# Patient Record
Sex: Female | Born: 1965
Health system: Southern US, Community
[De-identification: ages and names within clinical notes are randomized; demographics above are authoritative.]

## PROBLEM LIST (undated history)

## (undated) DIAGNOSIS — I10 Essential (primary) hypertension: Secondary | ICD-10-CM

---

## 1998-05-15 ENCOUNTER — Emergency Department (HOSPITAL_COMMUNITY): Admission: EM | Admit: 1998-05-15 | Discharge: 1998-05-15 | Payer: Self-pay

## 2000-12-23 ENCOUNTER — Emergency Department (HOSPITAL_COMMUNITY): Admission: EM | Admit: 2000-12-23 | Discharge: 2000-12-23 | Payer: Self-pay | Admitting: Emergency Medicine

## 2002-01-28 ENCOUNTER — Emergency Department (HOSPITAL_COMMUNITY): Admission: EM | Admit: 2002-01-28 | Discharge: 2002-01-28 | Payer: Self-pay

## 2002-01-30 ENCOUNTER — Emergency Department (HOSPITAL_COMMUNITY): Admission: EM | Admit: 2002-01-30 | Discharge: 2002-01-30 | Payer: Self-pay | Admitting: Emergency Medicine

## 2002-03-05 ENCOUNTER — Emergency Department (HOSPITAL_COMMUNITY): Admission: EM | Admit: 2002-03-05 | Discharge: 2002-03-05 | Payer: Self-pay | Admitting: Emergency Medicine

## 2002-03-05 ENCOUNTER — Encounter: Payer: Self-pay | Admitting: Emergency Medicine

## 2004-11-30 ENCOUNTER — Emergency Department (HOSPITAL_COMMUNITY): Admission: EM | Admit: 2004-11-30 | Discharge: 2004-11-30 | Payer: Self-pay | Admitting: Emergency Medicine

## 2007-08-01 ENCOUNTER — Emergency Department (HOSPITAL_COMMUNITY): Admission: EM | Admit: 2007-08-01 | Discharge: 2007-08-01 | Payer: Self-pay | Admitting: Emergency Medicine

## 2007-08-07 ENCOUNTER — Emergency Department (HOSPITAL_COMMUNITY): Admission: EM | Admit: 2007-08-07 | Discharge: 2007-08-07 | Payer: Self-pay | Admitting: Emergency Medicine

## 2008-05-13 ENCOUNTER — Emergency Department (HOSPITAL_COMMUNITY): Admission: EM | Admit: 2008-05-13 | Discharge: 2008-05-14 | Payer: Self-pay | Admitting: Emergency Medicine

## 2010-01-01 ENCOUNTER — Emergency Department (HOSPITAL_COMMUNITY): Admission: EM | Admit: 2010-01-01 | Discharge: 2010-01-01 | Payer: Self-pay | Admitting: Emergency Medicine

## 2010-03-24 ENCOUNTER — Encounter: Admission: RE | Admit: 2010-03-24 | Discharge: 2010-03-24 | Payer: Self-pay | Admitting: Internal Medicine

## 2012-07-10 ENCOUNTER — Encounter (HOSPITAL_COMMUNITY): Payer: Self-pay

## 2012-07-10 ENCOUNTER — Emergency Department (HOSPITAL_COMMUNITY)
Admission: EM | Admit: 2012-07-10 | Discharge: 2012-07-10 | Disposition: A | Discharge status: Home / Self Care | Payer: Medicaid Other | Attending: Emergency Medicine | Admitting: Emergency Medicine

## 2012-07-10 DIAGNOSIS — I10 Essential (primary) hypertension: Secondary | ICD-10-CM

## 2012-07-10 DIAGNOSIS — Z760 Encounter for issue of repeat prescription: Secondary | ICD-10-CM | POA: Insufficient documentation

## 2012-07-10 DIAGNOSIS — F17200 Nicotine dependence, unspecified, uncomplicated: Secondary | ICD-10-CM | POA: Insufficient documentation

## 2012-07-10 HISTORY — DX: Essential (primary) hypertension: I10

## 2012-07-10 MED ORDER — LISINOPRIL 10 MG PO TABS: 10.0000 mg | ORAL_TABLET | Freq: Every day | ORAL | Status: DC

## 2012-07-10 NOTE — ED Provider Notes (Signed)
History     CSN: [id number]  Arrival date & time 07/10/12  1153   First MD Initiated Contact with Patient 07/10/12 1729      Chief Complaint  Patient presents with  . Hypertension    (Consider location/radiation/quality/duration/timing/severity/associated sxs/prior treatment) HPI  46 year old female requesting for medication refill.  Pt sts she has been out of her lisinopril 10mg  PO once daily for the past 2 months.  She was walking around [place] today and she developed a frontal headache and also lightheadedness.  She was standing in line when someone said "your complexion looks pale" which caused her to be concern and come to ER to request medication refill.  Pt currently only complaining of a mild throbbing headache but denies vision changes, cp, sob, abd pain, n/v/d, urinary complaints, or leg swelling.  She has no other complaint.  Pt follow up at [facility] clinic.    Past Medical History  Diagnosis Date  . Hypertension     History reviewed. No pertinent past surgical history.  Family History  Problem Relation Age of Onset  . Cancer Mother     History  Substance Use Topics  . Smoking status: Current Some Day Smoker  . Smokeless tobacco: Never Used  . Alcohol Use: Yes     occasional    OB History    Grav Para Term Preterm Abortions TAB SAB Ect Mult Living                  Review of Systems  All other systems reviewed and are negative.      Allergies  Review of patient's allergies indicates no known allergies.  Home Medications  No current outpatient prescriptions on file.  BP 156/108  Pulse 69  Temp 98.1 F (36.7 C) (Oral)  Resp 16  SpO2 100%  Physical Exam  Nursing note and vitals reviewed. Constitutional: She is oriented to person, place, and time. She appears well-developed and well-nourished. No distress.       Awake, alert, nontoxic appearance  HENT:  Head: Atraumatic.  Eyes: Conjunctivae normal are normal. Right eye exhibits no  discharge. Left eye exhibits no discharge.  Neck: Neck supple.  Cardiovascular: Normal rate and regular rhythm.   Pulmonary/Chest: Effort normal. No respiratory distress. She exhibits no tenderness.  Abdominal: Soft. There is no tenderness. There is no rebound.  Musculoskeletal: She exhibits no edema and no tenderness.       ROM appears intact, no obvious focal weakness  Neurological: She is alert and oriented to person, place, and time. She has normal strength. Coordination and gait normal. GCS eye subscore is 4. GCS verbal subscore is 5. GCS motor subscore is 6.       Mental status and motor strength appears intact  Skin: No rash noted.  Psychiatric: She has a normal mood and affect.    ED Course  Procedures (including critical care time)  Labs Reviewed - No data to display No results found.   No diagnosis found.  1. Hypertension 2. Medication refill  MDM  Pt is here for medication refills.  She does complain of mild headache however she is alert and oriented, no gross neuro deficit, no cp, sob, or other signs suggestive of end organ damage.  Her BP is 151/91 and vital sign otherwise stable.  Pt request to be discharge in order to catch the bus.  I felt pt is stable to be d/c.  I recommend f/u with PCP for further evaluation.  Will refill her lisinopril.    BP 156/108  Pulse 69  Temp 98.1 F (36.7 C) (Oral)  Resp 16  SpO2 100%       Fayrene Helper, PA-C 07/10/12 1742  Fayrene Helper, PA-C 07/10/12 1742

## 2012-07-10 NOTE — ED Notes (Addendum)
Patient reports that she was at [facility] today and got diaphoretic, dizzy, and a frontal headache. Patient states she is suppose to be on Lisinopril, but has ran out.

## 2012-07-11 NOTE — ED Provider Notes (Signed)
Medical screening examination/treatment/procedure(s) were performed by non-physician practitioner and as supervising physician I was immediately available for consultation/collaboration.  Doug Sou, MD 07/11/12 [phone]

## 2013-06-11 ENCOUNTER — Encounter (HOSPITAL_COMMUNITY): Payer: Self-pay | Admitting: Emergency Medicine

## 2013-06-11 ENCOUNTER — Emergency Department (HOSPITAL_COMMUNITY)
Admission: EM | Admit: 2013-06-11 | Discharge: 2013-06-11 | Disposition: A | Discharge status: Home / Self Care | Payer: Medicaid Other | Attending: Emergency Medicine | Admitting: Emergency Medicine

## 2013-06-11 DIAGNOSIS — F17200 Nicotine dependence, unspecified, uncomplicated: Secondary | ICD-10-CM | POA: Insufficient documentation

## 2013-06-11 DIAGNOSIS — J029 Acute pharyngitis, unspecified: Secondary | ICD-10-CM | POA: Insufficient documentation

## 2013-06-11 DIAGNOSIS — Z79899 Other long term (current) drug therapy: Secondary | ICD-10-CM | POA: Insufficient documentation

## 2013-06-11 DIAGNOSIS — I10 Essential (primary) hypertension: Secondary | ICD-10-CM | POA: Insufficient documentation

## 2013-06-11 DIAGNOSIS — J019 Acute sinusitis, unspecified: Secondary | ICD-10-CM | POA: Insufficient documentation

## 2013-06-11 MED ORDER — LORATADINE-PSEUDOEPHEDRINE ER 10-240 MG PO TB24: 1.0000 | ORAL_TABLET | Freq: Every day | ORAL | Status: DC

## 2013-06-11 MED ORDER — FLUTICASONE PROPIONATE 50 MCG/ACT NA SUSP: 2.0000 | Freq: Every day | NASAL | Status: DC

## 2013-06-11 MED ORDER — AMOXICILLIN 500 MG PO CAPS: 1000.0000 mg | ORAL_CAPSULE | Freq: Three times a day (TID) | ORAL | Status: DC

## 2013-06-11 NOTE — ED Provider Notes (Signed)
CSN: [id number]     Arrival date & time 06/11/13  1217 History   First MD Initiated Contact with Patient 06/11/13 1234     Chief Complaint  Patient presents with  . Headache  . Sore Throat  . Nasal Congestion   (Consider location/radiation/quality/duration/timing/severity/associated sxs/prior Treatment) HPI Comments: Patient presents with a chief complaint of sinus pressure, nasal congestion, cough, and sore throat.  She reports that her symptoms have been present for the past week and are gradually worsening.  She has taken Alka-Seltzer cold medicine for her symptoms without relief.  She denies fever, chills, nausea, vomiting, diarrhea, SOB, or chest pain.    The history is provided by the patient.    Past Medical History  Diagnosis Date  . Hypertension    History reviewed. No pertinent past surgical history. Family History  Problem Relation Age of Onset  . Cancer Mother    History  Substance Use Topics  . Smoking status: Current Some Day Smoker  . Smokeless tobacco: Never Used  . Alcohol Use: Yes     Comment: occasional   OB History   Grav Para Term Preterm Abortions TAB SAB Ect Mult Living                 Review of Systems  HENT: Positive for congestion, sore throat, rhinorrhea and sinus pressure.   All other systems reviewed and are negative.    Allergies  Review of patient's allergies indicates no known allergies.  Home Medications   Current Outpatient Rx  Name  Route  Sig  Dispense  Refill  . CALCIUM-VITAMIN D PO   Oral   Take 1 tablet by mouth daily.         . Chlorphen-Phenyleph-ASA (ALKA-SELTZER PLUS COLD) 2-7.8-325 MG TBEF   Oral   Take 2 capsules by mouth daily as needed (for cold).         Marland Kitchen lisinopril (PRINIVIL) 10 MG tablet   Oral   Take 1 tablet (10 mg total) by mouth daily.   30 tablet   0   . Multiple Vitamins-Minerals (ADULT ONE DAILY GUMMIES) CHEW   Oral   Chew 1 each by mouth daily.         . Omega-3 Fatty Acids (FISH OIL  PO)   Oral   Take 1 capsule by mouth daily.          BP 129/97  Pulse 86  Temp(Src) 98.6 F (37 C) (Oral)  Resp 14  SpO2 96% Physical Exam  Nursing note and vitals reviewed. Constitutional: She appears well-developed and well-nourished.  HENT:  Head: Normocephalic and atraumatic.  Nose: Mucosal edema and rhinorrhea present.  Mouth/Throat: Uvula is midline. No trismus in the jaw. No edematous. Oropharyngeal exudate and posterior oropharyngeal erythema present.  Neck: Normal range of motion. Neck supple.  Cardiovascular: Normal rate, regular rhythm and normal heart sounds.   Pulmonary/Chest: Effort normal and breath sounds normal.  Lymphadenopathy:    She has cervical adenopathy.  Neurological: She is alert.  Skin: Skin is warm and dry. No rash noted.  Psychiatric: She has a normal mood and affect.    ED Course  Procedures (including critical care time) Labs Review Labs Reviewed - No data to display Imaging Review No results found.  MDM  No diagnosis found. Patient presenting with signs and symptoms consistent with an Acute Sinusitis.  Patient discharged home with antibiotic.      Pascal Lux [place], PA-C 06/12/13 2056

## 2013-06-11 NOTE — ED Notes (Signed)
Pt c/o headache, sore throat, and nasal congestion x1 week.  Pt sts "I believe it's a sinus infection"  Pt NAD at this time.

## 2013-06-13 NOTE — ED Provider Notes (Signed)
Medical screening examination/treatment/procedure(s) were performed by non-physician practitioner and as supervising physician I was immediately available for consultation/collaboration.   Audree Camel, MD 06/13/13 [phone]

## 2014-01-02 ENCOUNTER — Emergency Department (HOSPITAL_COMMUNITY)
Admission: EM | Admit: 2014-01-02 | Discharge: 2014-01-02 | Disposition: A | Discharge status: Home / Self Care | Payer: Medicaid Other | Source: Home / Self Care | Attending: Family Medicine | Admitting: Family Medicine

## 2014-01-02 ENCOUNTER — Encounter (HOSPITAL_COMMUNITY): Payer: Self-pay | Admitting: Emergency Medicine

## 2014-01-02 DIAGNOSIS — I10 Essential (primary) hypertension: Secondary | ICD-10-CM

## 2014-01-02 LAB — POCT I-STAT, CHEM 8
BUN: 13 mg/dL (ref 6–23)
Calcium, Ion: 1.24 mmol/L — ABNORMAL HIGH (ref 1.12–1.23)
Chloride: 104 mEq/L (ref 96–112)
Creatinine, Ser: 1 mg/dL (ref 0.50–1.10)
GLUCOSE: 123 mg/dL — AB (ref 70–99)
HEMATOCRIT: 46 % (ref 36.0–46.0)
HEMOGLOBIN: 15.6 g/dL — AB (ref 12.0–15.0)
POTASSIUM: 4.5 meq/L (ref 3.7–5.3)
SODIUM: 140 meq/L (ref 137–147)
TCO2: 28 mmol/L (ref 0–100)

## 2014-01-02 MED ORDER — LISINOPRIL-HYDROCHLOROTHIAZIDE 10-12.5 MG PO TABS: 1.0000 | ORAL_TABLET | Freq: Every day | ORAL | Status: DC

## 2014-01-02 NOTE — ED Notes (Signed)
Has not had her BP medication for 1 year, and thinks this may be causing her HA . Also concerned for pain in lower abdominal area for more than 1 year ( no sex for 1 year or more) Has not seen a MD for either problem in over 1 year

## 2014-01-02 NOTE — Discharge Instructions (Signed)
Take medicine daily and see your doctor for follow-up. DASH Diet The DASH diet stands for "Dietary Approaches to Stop Hypertension." It is a healthy eating plan that has been shown to reduce high blood pressure (hypertension) in as little as 14 days, while also possibly providing other significant health benefits. These other health benefits include reducing the risk of breast cancer after menopause and reducing the risk of type 2 diabetes, heart disease, colon cancer, and stroke. Health benefits also include weight loss and slowing kidney failure in patients with chronic kidney disease.  DIET GUIDELINES  Limit salt (sodium). Your diet should contain less than 1500 mg of sodium daily.  Limit refined or processed carbohydrates. Your diet should include mostly whole grains. Desserts and added sugars should be used sparingly.  Include small amounts of heart-healthy fats. These types of fats include nuts, oils, and tub margarine. Limit saturated and trans fats. These fats have been shown to be harmful in the body. CHOOSING FOODS  The following food groups are based on a 2000 calorie diet. See your Registered Dietitian for individual calorie needs. Grains and Grain Products (6 to 8 servings daily)  Eat More Often: Whole-wheat bread, brown rice, whole-grain or wheat pasta, quinoa, popcorn without added fat or salt (air popped).  Eat Less Often: White bread, white pasta, white rice, cornbread. Vegetables (4 to 5 servings daily)  Eat More Often: Fresh, frozen, and canned vegetables. Vegetables may be raw, steamed, roasted, or grilled with a minimal amount of fat.  Eat Less Often/Avoid: Creamed or fried vegetables. Vegetables in a cheese sauce. Fruit (4 to 5 servings daily)  Eat More Often: All fresh, canned (in natural juice), or frozen fruits. Dried fruits without added sugar. One hundred percent fruit juice ( cup [237 mL] daily).  Eat Less Often: Dried fruits with added sugar. Canned fruit in  light or heavy syrup. Foot LockerLean Meats, Fish, and Poultry (2 servings or less daily. One serving is 3 to 4 oz [85-114 g]).  Eat More Often: Ninety percent or leaner ground beef, tenderloin, sirloin. Round cuts of beef, chicken breast, [place]key breast. All fish. Grill, bake, or broil your meat. Nothing should be fried.  Eat Less Often/Avoid: Fatty cuts of meat, [place]key, or chicken leg, thigh, or wing. Fried cuts of meat or fish. Dairy (2 to 3 servings)  Eat More Often: Low-fat or fat-free milk, low-fat plain or light yogurt, reduced-fat or part-skim cheese.  Eat Less Often/Avoid: Milk (whole, 2%).Whole milk yogurt. Full-fat cheeses. Nuts, Seeds, and Legumes (4 to 5 servings per week)  Eat More Often: All without added salt.  Eat Less Often/Avoid: Salted nuts and seeds, canned beans with added salt. Fats and Sweets (limited)  Eat More Often: Vegetable oils, tub margarines without trans fats, sugar-free gelatin. Mayonnaise and salad dressings.  Eat Less Often/Avoid: Coconut oils, palm oils, butter, stick margarine, cream, half and half, cookies, candy, pie. FOR MORE INFORMATION The Dash Diet Eating Plan: www.dashdiet.org Document Released: 09/09/2011 Document Revised: 12/13/2011 Document Reviewed: 09/09/2011 [facility] Patient Information 2014 [place], [place].

## 2014-01-02 NOTE — ED Provider Notes (Signed)
CSN: [id number]     Arrival date & time 01/02/14  1626 History   First MD Initiated Contact with Patient 01/02/14 1711     Chief Complaint  Patient presents with  . Hypertension   (Consider location/radiation/quality/duration/timing/severity/associated sxs/prior Treatment) Patient is a 48 y.o. female presenting with hypertension. The history is provided by the patient.  Hypertension This is a chronic problem. Episode onset: out of meds for >80mo. no cp , sob. Associated symptoms include headaches. Pertinent negatives include no chest pain and no shortness of breath.    Past Medical History  Diagnosis Date  . Hypertension    History reviewed. No pertinent past surgical history. Family History  Problem Relation Age of Onset  . Cancer Mother    History  Substance Use Topics  . Smoking status: Current Some Day Smoker  . Smokeless tobacco: Never Used  . Alcohol Use: Yes     Comment: occasional   OB History   Grav Para Term Preterm Abortions TAB SAB Ect Mult Living                 Review of Systems  Constitutional: Negative.   Respiratory: Negative for shortness of breath.   Cardiovascular: Negative for chest pain, palpitations and leg swelling.  Gastrointestinal: Negative.   Neurological: Positive for headaches.    Allergies  Review of patient's allergies indicates no known allergies.  Home Medications   Current Outpatient Rx  Name  Route  Sig  Dispense  Refill  . amoxicillin (AMOXIL) 500 MG capsule   Oral   Take 2 capsules (1,000 mg total) by mouth 3 (three) times daily.   60 capsule   0   . CALCIUM-VITAMIN D PO   Oral   Take 1 tablet by mouth daily.         . Chlorphen-Phenyleph-ASA (ALKA-SELTZER PLUS COLD) 2-7.8-325 MG TBEF   Oral   Take 2 capsules by mouth daily as needed (for cold).         . fluticasone (FLONASE) 50 MCG/ACT nasal spray   Nasal   Place 2 sprays into the nose daily.   16 g   0   . lisinopril (PRINIVIL) 10 MG tablet   Oral   Take  1 tablet (10 mg total) by mouth daily.   30 tablet   0   . lisinopril-hydrochlorothiazide (PRINZIDE,ZESTORETIC) 10-12.5 MG per tablet   Oral   Take 1 tablet by mouth daily.   30 tablet   1   . loratadine-pseudoephedrine (CLARITIN-D 24 HOUR) 10-240 MG per 24 hr tablet   Oral   Take 1 tablet by mouth daily.   10 tablet   0   . Multiple Vitamins-Minerals (ADULT ONE DAILY GUMMIES) CHEW   Oral   Chew 1 each by mouth daily.         . Omega-3 Fatty Acids (FISH OIL PO)   Oral   Take 1 capsule by mouth daily.          BP 144/106  Pulse 84  Temp(Src) 98.2 F (36.8 C) (Oral)  Resp 20  SpO2 98% Physical Exam  Nursing note and vitals reviewed. Constitutional: She is oriented to person, place, and time. She appears well-developed and well-nourished. No distress.  HENT:  Head: Normocephalic.  Eyes: Conjunctivae and EOM are normal. Pupils are equal, round, and reactive to light.  Neck: Normal range of motion. Neck supple.  Cardiovascular: Normal rate, regular rhythm, normal heart sounds and intact distal pulses.   Pulmonary/Chest: Effort  normal and breath sounds normal.  Lymphadenopathy:    She has no cervical adenopathy.  Neurological: She is alert and oriented to person, place, and time.  Skin: Skin is warm and dry.    ED Course  Procedures (including critical care time) Labs Review Labs Reviewed  POCT I-STAT, CHEM 8 - Abnormal; Notable for the following:    Glucose, Bld 123 (*)    Calcium, Ion 1.24 (*)    Hemoglobin 15.6 (*)    All other components within normal limits   Imaging Review No results found. i-stat glu 123, ca 1.24  MDM   1. Hypertension, benign       Linna HoffJames D Athina Fahey, MD 01/02/14 1759

## 2014-10-12 ENCOUNTER — Emergency Department (HOSPITAL_COMMUNITY)
Admission: EM | Admit: 2014-10-12 | Discharge: 2014-10-12 | Disposition: A | Discharge status: Home / Self Care | Payer: Medicaid Other | Attending: Emergency Medicine | Admitting: Emergency Medicine

## 2014-10-12 ENCOUNTER — Encounter (HOSPITAL_COMMUNITY): Payer: Self-pay | Admitting: Emergency Medicine

## 2014-10-12 DIAGNOSIS — R079 Chest pain, unspecified: Secondary | ICD-10-CM | POA: Insufficient documentation

## 2014-10-12 DIAGNOSIS — Z79899 Other long term (current) drug therapy: Secondary | ICD-10-CM | POA: Insufficient documentation

## 2014-10-12 DIAGNOSIS — I10 Essential (primary) hypertension: Secondary | ICD-10-CM | POA: Insufficient documentation

## 2014-10-12 DIAGNOSIS — R51 Headache: Secondary | ICD-10-CM | POA: Insufficient documentation

## 2014-10-12 DIAGNOSIS — R519 Headache, unspecified: Secondary | ICD-10-CM

## 2014-10-12 DIAGNOSIS — Z7951 Long term (current) use of inhaled steroids: Secondary | ICD-10-CM | POA: Insufficient documentation

## 2014-10-12 DIAGNOSIS — Z720 Tobacco use: Secondary | ICD-10-CM | POA: Insufficient documentation

## 2014-10-12 LAB — CBC
HCT: 41.8 % (ref 36.0–46.0)
HEMOGLOBIN: 14.1 g/dL (ref 12.0–15.0)
MCH: 29.5 pg (ref 26.0–34.0)
MCHC: 33.7 g/dL (ref 30.0–36.0)
MCV: 87.4 fL (ref 78.0–100.0)
Platelets: 280 10*3/uL (ref 150–400)
RBC: 4.78 MIL/uL (ref 3.87–5.11)
RDW: 13.7 % (ref 11.5–15.5)
WBC: 7 10*3/uL (ref 4.0–10.5)

## 2014-10-12 LAB — BASIC METABOLIC PANEL
ANION GAP: 5 (ref 5–15)
BUN: 21 mg/dL (ref 6–23)
CHLORIDE: 107 meq/L (ref 96–112)
CO2: 27 mmol/L (ref 19–32)
CREATININE: 0.86 mg/dL (ref 0.50–1.10)
Calcium: 9 mg/dL (ref 8.4–10.5)
GFR calc Af Amer: 90 mL/min — ABNORMAL LOW (ref >90)
GFR calc non Af Amer: 77 mL/min — ABNORMAL LOW (ref >90)
Glucose, Bld: 103 mg/dL — ABNORMAL HIGH (ref 70–99)
POTASSIUM: 3.6 mmol/L (ref 3.5–5.1)
SODIUM: 139 mmol/L (ref 135–145)

## 2014-10-12 LAB — I-STAT TROPONIN, ED: Troponin i, poc: 0 ng/mL (ref 0.00–0.08)

## 2014-10-12 MED ORDER — LISINOPRIL 10 MG PO TABS: 10.0000 mg | ORAL_TABLET | Freq: Every day | ORAL | Status: DC

## 2014-10-12 MED ORDER — HYDROCODONE-ACETAMINOPHEN 5-325 MG PO TABS: 1.0000 | ORAL_TABLET | Freq: Four times a day (QID) | ORAL | Status: DC | PRN

## 2014-10-12 MED ORDER — HYDROCODONE-ACETAMINOPHEN 5-325 MG PO TABS
1.0000 | ORAL_TABLET | Freq: Once | ORAL | Status: AC
Start: 2014-10-12 — End: 2014-10-12
  Administered 2014-10-12: 1 via ORAL
  Filled 2014-10-12: qty 1

## 2014-10-12 NOTE — Discharge Instructions (Signed)
As discussed, your evaluation today has been largely reassuring.  But, it is important that you monitor your condition carefully, and do not hesitate to return to the ED if you develop new, or concerning changes in your condition. ? ?Otherwise, please follow-up with your physician for appropriate ongoing care. ? ?

## 2014-10-12 NOTE — ED Notes (Signed)
Pt arrived to the ED with a complaint of a headache with chest pain.  Pt states the chest pain is centrally located and has had it for a week. Pt describes pain as sharp.  Pt states the headache has been present for three day.  Pt states head pain is located in the forehead and aches

## 2014-10-12 NOTE — ED Notes (Signed)
Awake. Verbally responsive. A/O x4. Resp even and unlabored. No audible adventitious breath sounds noted. ABC's intact. SR on monitor at 80bpm. Pt reported having CP, headache, blurred vision and dizziness x 1 week. Denies radiation, SOB, N/V, and diaphoresis. Pt noted eating meat skins out of vendor machine. Pt encourage not to eat anything until seen via MD. Pt stated that she took Advil and Motrin without relief noted.

## 2014-10-12 NOTE — ED Provider Notes (Signed)
CSN: [id number]     Arrival date & time 10/12/14  1909 History   First MD Initiated Contact with Patient 10/12/14 2109     Chief Complaint  Patient presents with  . Headache  . Chest Pain     (Consider location/radiation/quality/duration/timing/severity/associated sxs/prior Treatment) HPI Patient presents with a headache, concern for hypertension. She notes that the headache began about one week ago, without clear precipitant.  Since that time patient has a headache, generalized discomfort, no confusion, disorders, syncope, visual loss. No relief with single doses of ibuprofen or Tylenol. No unilateral weakness, no chest pain, no dyspnea. This is different than early report to one of the nurses.  Past Medical History  Diagnosis Date  . Hypertension    History reviewed. No pertinent past surgical history. Family History  Problem Relation Age of Onset  . Cancer Mother    History  Substance Use Topics  . Smoking status: Current Some Day Smoker  . Smokeless tobacco: Never Used  . Alcohol Use: Yes     Comment: occasional   OB History    No data available     Review of Systems  Constitutional:       Per HPI, otherwise negative  HENT:       Per HPI, otherwise negative  Respiratory:       Per HPI, otherwise negative  Cardiovascular:       Per HPI, otherwise negative  Gastrointestinal: Negative for vomiting.  Endocrine:       Negative aside from HPI  Genitourinary:       Neg aside from HPI   Musculoskeletal:       Per HPI, otherwise negative  Skin: Negative.   Neurological: Positive for headaches. Negative for syncope.      Allergies  Review of patient's allergies indicates no known allergies.  Home Medications   Prior to Admission medications   Medication Sig Start Date End Date Taking? Authorizing Provider  Chlorphen-Phenyleph-ASA (ALKA-SELTZER PLUS COLD) 2-7.8-325 MG TBEF Take 2 capsules by mouth daily as needed (for cold).   Yes Historical Provider, MD   amoxicillin (AMOXIL) 500 MG capsule Take 2 capsules (1,000 mg total) by mouth 3 (three) times daily. Patient not taking: Reported on 10/12/2014 06/11/13   Santiago Glad, PA-C  CALCIUM-VITAMIN D PO Take 1 tablet by mouth daily.    Historical Provider, MD  fluticasone (FLONASE) 50 MCG/ACT nasal spray Place 2 sprays into the nose daily. Patient taking differently: Place 2 sprays into the nose 2 (two) times daily as needed for allergies (allergies).  06/11/13   Santiago Glad, PA-C  HYDROcodone-acetaminophen (NORCO/VICODIN) 5-325 MG per tablet Take 1 tablet by mouth every 6 (six) hours as needed for moderate pain. 10/12/14   Gerhard Munch, MD  lisinopril (PRINIVIL) 10 MG tablet Take 1 tablet (10 mg total) by mouth daily. 10/12/14   Gerhard Munch, MD  lisinopril-hydrochlorothiazide (PRINZIDE,ZESTORETIC) 10-12.5 MG per tablet Take 1 tablet by mouth daily. Patient not taking: Reported on 10/12/2014 01/02/14   Linna Hoff, MD  loratadine-pseudoephedrine (CLARITIN-D 24 HOUR) 10-240 MG per 24 hr tablet Take 1 tablet by mouth daily. Patient not taking: Reported on 10/12/2014 06/11/13   Heather Laisure, PA-C   BP 115/76 mmHg  Pulse 77  Temp(Src) 98.2 F (36.8 C) (Oral)  Resp 18  SpO2 100% Physical Exam  Constitutional: She is oriented to person, place, and time. She appears well-developed and well-nourished. No distress.  HENT:  Head: Normocephalic and atraumatic.  Eyes: Conjunctivae and EOM are  normal.  Cardiovascular: Normal rate and regular rhythm.   Pulmonary/Chest: Effort normal and breath sounds normal. No stridor. No respiratory distress.  Abdominal: She exhibits no distension.  Musculoskeletal: She exhibits no edema.  Neurological: She is alert and oriented to person, place, and time. No cranial nerve deficit. She exhibits normal muscle tone. Coordination normal.  Skin: Skin is warm and dry.  Psychiatric: She has a normal mood and affect.  Nursing note and vitals reviewed.   ED Course   Procedures (including critical care time) Labs Review Labs Reviewed  BASIC METABOLIC PANEL - Abnormal; Notable for the following:    Glucose, Bld 103 (*)    GFR calc non Af Amer 77 (*)    GFR calc Af Amer 90 (*)    All other components within normal limits  CBC  I-STAT TROPOININ, ED   10:21 PM Patient awake and alert, smiling, headache has resolved.  I counseled the patient on the need to stop smoking.   MDM   Final diagnoses:  Bad headache   Patient presents with concern of headache.  Here she is neurologically intact, hemodynamically stable, awake and alert, smiling during her emergency department course. Labs are reassuring, vital signs reassuring, and with resolution of her symptoms, there is low suspicion for acute new pathology. Patient was provided refill of her antihypertensive, per request, discharged in stable condition.     Gerhard Munchobert Annel Zunker, MD 10/12/14 2223

## 2014-10-12 NOTE — ED Notes (Signed)
Pt has a ride home.  

## 2014-10-12 NOTE — ED Notes (Signed)
EKG given to EDP Kohut for review 

## 2015-09-13 ENCOUNTER — Encounter (HOSPITAL_COMMUNITY): Payer: Self-pay

## 2015-09-13 ENCOUNTER — Emergency Department (HOSPITAL_COMMUNITY)
Admission: EM | Admit: 2015-09-13 | Discharge: 2015-09-13 | Disposition: A | Discharge status: Home / Self Care | Payer: Medicaid Other | Attending: Emergency Medicine | Admitting: Emergency Medicine

## 2015-09-13 DIAGNOSIS — K002 Abnormalities of size and form of teeth: Secondary | ICD-10-CM | POA: Insufficient documentation

## 2015-09-13 DIAGNOSIS — Z7951 Long term (current) use of inhaled steroids: Secondary | ICD-10-CM | POA: Insufficient documentation

## 2015-09-13 DIAGNOSIS — K029 Dental caries, unspecified: Secondary | ICD-10-CM | POA: Insufficient documentation

## 2015-09-13 DIAGNOSIS — F17200 Nicotine dependence, unspecified, uncomplicated: Secondary | ICD-10-CM | POA: Insufficient documentation

## 2015-09-13 DIAGNOSIS — I10 Essential (primary) hypertension: Secondary | ICD-10-CM | POA: Insufficient documentation

## 2015-09-13 DIAGNOSIS — Z79899 Other long term (current) drug therapy: Secondary | ICD-10-CM | POA: Insufficient documentation

## 2015-09-13 DIAGNOSIS — K0889 Other specified disorders of teeth and supporting structures: Secondary | ICD-10-CM | POA: Insufficient documentation

## 2015-09-13 MED ORDER — ACETAMINOPHEN 500 MG PO TABS
1000.0000 mg | ORAL_TABLET | Freq: Once | ORAL | Status: AC
  Administered 2015-09-13: 1000 mg via ORAL
  Filled 2015-09-13: qty 2

## 2015-09-13 MED ORDER — PENICILLIN V POTASSIUM 500 MG PO TABS
500.0000 mg | ORAL_TABLET | Freq: Once | ORAL | Status: AC
  Administered 2015-09-13: 500 mg via ORAL
  Filled 2015-09-13: qty 1

## 2015-09-13 MED ORDER — LISINOPRIL-HYDROCHLOROTHIAZIDE 10-12.5 MG PO TABS: 1.0000 | ORAL_TABLET | Freq: Every day | ORAL | Status: DC

## 2015-09-13 MED ORDER — TRAMADOL HCL 50 MG PO TABS: 50.0000 mg | ORAL_TABLET | Freq: Four times a day (QID) | ORAL | Status: DC | PRN

## 2015-09-13 MED ORDER — PENICILLIN V POTASSIUM 500 MG PO TABS: 500.0000 mg | ORAL_TABLET | Freq: Four times a day (QID) | ORAL | Status: AC

## 2015-09-13 NOTE — Discharge Instructions (Signed)
Dental Pain  ° °Dental pain may be caused by many things, including:  °Tooth decay (cavities or caries). Cavities expose the nerve of your tooth to air and hot or cold temperatures. This can cause pain or discomfort.  °Abscess or infection. A dental abscess is a collection of infected pus from a bacterial infection in the inner part of the tooth (pulp). It usually occurs at the end of the tooth's root.  °Injury.  °An unknown reason (idiopathic). °Your pain may be mild or severe. It may only occur when:  °You are chewing.  °You are exposed to hot or cold temperature.  °You are eating or drinking sugary foods or beverages, such as soda or candy. °Your pain may also be constant.  °HOME CARE INSTRUCTIONS  °Watch your dental pain for any changes. The following actions may help to lessen any discomfort that you are feeling:  °Take medicines only as directed by your dentist.  °If you were prescribed an antibiotic medicine, finish all of it even if you start to feel better.  °Keep all follow-up visits as directed by your dentist. This is important.  °Do not apply heat to the outside of your face.  °Rinse your mouth or gargle with salt water if directed by your dentist. This helps with pain and swelling.  °You can make salt water by adding ¼ tsp of salt to 1 cup of warm water. °Apply ice to the painful area of your face:  °Put ice in a plastic bag.  °Place a towel between your skin and the bag.  °Leave the ice on for 20 minutes, 2-3 times per day. °Avoid foods or drinks that cause you pain, such as:  °Very hot or very cold foods or drinks.  °Sweet or sugary foods or drinks. °SEEK MEDICAL CARE IF:  °Your pain is not controlled with medicines.  °Your symptoms are worse.  °You have new symptoms. °SEEK IMMEDIATE MEDICAL CARE IF:  °You are unable to open your mouth.  °You are having trouble breathing or swallowing.  °You have a fever.  °Your face, neck, or jaw is swollen. °This information is not intended to replace advice given  to you by your health care provider. Make sure you discuss any questions you have with your health care provider.  °Document Released: 09/20/2005 Document Revised: 02/04/2015 Document Reviewed: 09/16/2014  °Elsevier Interactive Patient Education ©2016 Elsevier Inc[employer]  ° °Dental Abscess  ° °A dental abscess is a collection of pus in or around a tooth.  °CAUSES  °This condition is caused by a bacterial infection around the root of the tooth that involves the inner part of the tooth (pulp). It may result from:  °Severe tooth decay.  °Trauma to the tooth that allows bacteria to enter into the pulp, such as a broken or chipped tooth.  °Severe gum disease around a tooth. °SYMPTOMS  °Symptoms of this condition include:  °Severe pain in and around the infected tooth.  °Swelling and redness around the infected tooth, in the mouth, or in the face.  °Tenderness.  °Pus drainage.  °Bad breath.  °Bitter taste in the mouth.  °Difficulty swallowing.  °Difficulty opening the mouth.  °Nausea.  °Vomiting.  °Chills.  °Swollen neck glands.  °Fever. °DIAGNOSIS  °This condition is diagnosed with examination of the infected tooth. During the exam, your dentist may tap on the infected tooth. Your dentist will also ask about your medical and dental history and may order X-rays.  °TREATMENT  °This condition is treated   by eliminating the infection. This may be done with:  °Antibiotic medicine.  °A root canal. This may be performed to save the tooth.  °Pulling (extracting) the tooth. This may also involve draining the abscess. This is done if the tooth cannot be saved. °HOME CARE INSTRUCTIONS  °Take medicines only as directed by your dentist.  °If you were prescribed antibiotic medicine, finish all of it even if you start to feel better.  °Rinse your mouth (gargle) often with salt water to relieve pain or swelling.  °Do not drive or operate heavy machinery while taking pain medicine.  °Do not apply heat to the outside of your mouth.  °Keep all  follow-up visits as directed by your dentist. This is important. °SEEK MEDICAL CARE IF:  °Your pain is worse and is not helped by medicine. °SEEK IMMEDIATE MEDICAL CARE IF:  °You have a fever or chills.  °Your symptoms suddenly get worse.  °You have a very bad headache.  °You have problems breathing or swallowing.  °You have trouble opening your mouth.  °You have swelling in your neck or around your eye. °This information is not intended to replace advice given to you by your health care provider. Make sure you discuss any questions you have with your health care provider.  °Document Released: 09/20/2005 Document Revised: 02/04/2015 Document Reviewed: 09/17/2014  °Elsevier Interactive Patient Education ©2016 Elsevier Inc.  ° °

## 2015-09-13 NOTE — ED Notes (Signed)
Pt presents with c/o dental pain for approx one week. Pt reports the pain is in the lower left portion of her mouth.

## 2015-09-13 NOTE — ED Provider Notes (Signed)
CSN: [id number]     Arrival date & time 09/13/15  1317 History  By signing my name below, I, Soijett Blue, attest that this documentation has been prepared under the direction and in the presence of Danelle Berry, PA-C Electronically Signed: Soijett Blue, ED Scribe. 09/13/2015. 2:18 PM.   Chief Complaint  Patient presents with  . Dental Pain      The history is provided by the patient. No language interpreter was used.    Sheri Beck is a 49 y.o. female with a medical hx of HTN who presents to the Emergency Department complaining of 8-9/10, progressively worsening, aching/throbbing, left lower dental pain onset 1 week. She notes that her left lower dental pain is worsened with cold air. She states that her pain has been keeping her up at night. She states that she has tried OTC medications with no relief for her symptoms. She denies gum swelling/bleeding, vomiting, diaphoresis, nausea, fever, chills, and any other symptoms. Pt denies allergies to any medications. Denies being seen for her dental pain recently.    Past Medical History  Diagnosis Date  . Hypertension    No past surgical history on file. Family History  Problem Relation Age of Onset  . Cancer Mother    Social History  Substance Use Topics  . Smoking status: Current Some Day Smoker  . Smokeless tobacco: Never Used  . Alcohol Use: Yes     Comment: occasional   OB History    No data available     Review of Systems  Constitutional: Negative for fever, chills and diaphoresis.  HENT: Positive for dental problem and facial swelling. Negative for trouble swallowing.   Gastrointestinal: Negative for nausea.      Allergies  Review of patient's allergies indicates no known allergies.  Home Medications   Prior to Admission medications   Medication Sig Start Date End Date Taking? Authorizing Provider  amoxicillin (AMOXIL) 500 MG capsule Take 2 capsules (1,000 mg total) by mouth 3 (three) times daily. Patient not  taking: Reported on 10/12/2014 06/11/13   Santiago Glad, PA-C  CALCIUM-VITAMIN D PO Take 1 tablet by mouth daily.    Historical Provider, MD  Chlorphen-Phenyleph-ASA (ALKA-SELTZER PLUS COLD) 2-7.8-325 MG TBEF Take 2 capsules by mouth daily as needed (for cold).    Historical Provider, MD  fluticasone (FLONASE) 50 MCG/ACT nasal spray Place 2 sprays into the nose daily. Patient taking differently: Place 2 sprays into the nose 2 (two) times daily as needed for allergies (allergies).  06/11/13   Santiago Glad, PA-C  HYDROcodone-acetaminophen (NORCO/VICODIN) 5-325 MG per tablet Take 1 tablet by mouth every 6 (six) hours as needed for moderate pain. 10/12/14   Gerhard Munch, MD  lisinopril (PRINIVIL) 10 MG tablet Take 1 tablet (10 mg total) by mouth daily. 10/12/14   Gerhard Munch, MD  lisinopril-hydrochlorothiazide (PRINZIDE,ZESTORETIC) 10-12.5 MG per tablet Take 1 tablet by mouth daily. Patient not taking: Reported on 10/12/2014 01/02/14   Linna Hoff, MD  loratadine-pseudoephedrine (CLARITIN-D 24 HOUR) 10-240 MG per 24 hr tablet Take 1 tablet by mouth daily. Patient not taking: Reported on 10/12/2014 06/11/13   Heather Laisure, PA-C   BP 177/111 mmHg  Pulse 76  Temp(Src) 98 F (36.7 C) (Oral)  Resp 18  SpO2 100% Physical Exam  Constitutional: She is oriented to person, place, and time. She appears well-developed and well-nourished. No distress.  HENT:  Head: Normocephalic and atraumatic.  Right Ear: External ear normal.  Left Ear: External ear normal.  Nose: Nose normal.  Mouth/Throat: Uvula is midline, oropharynx is clear and moist and mucous membranes are normal. Mucous membranes are not pale, not dry and not cyanotic. No oral lesions. No trismus in the jaw. Abnormal dentition. Dental caries present. No dental abscesses or uvula swelling. No oropharyngeal exudate, posterior oropharyngeal edema, posterior oropharyngeal erythema or tonsillar abscesses.    Tenderness to the gumline posterior to the  bicuspid molar.  Eyes: Conjunctivae and EOM are normal. Pupils are equal, round, and reactive to light. Right eye exhibits no discharge. Left eye exhibits no discharge. No scleral icterus.  Neck: Normal range of motion. Neck supple. No JVD present. No tracheal deviation present.  Cardiovascular: Normal rate, regular rhythm, normal heart sounds and intact distal pulses.  Exam reveals no gallop and no friction rub.   No murmur heard. Pulmonary/Chest: Effort normal and breath sounds normal. No stridor. No respiratory distress. She has no wheezes. She has no rales. She exhibits no tenderness.  Abdominal: Soft. Bowel sounds are normal. She exhibits no distension. There is no tenderness.  Musculoskeletal: Normal range of motion. She exhibits no edema.  Lymphadenopathy:    She has no cervical adenopathy.  Neurological: She is alert and oriented to person, place, and time. She exhibits normal muscle tone. Coordination normal.  Skin: Skin is warm and dry. No rash noted. She is not diaphoretic. No erythema. No pallor.  Psychiatric: She has a normal mood and affect. Her behavior is normal. Judgment and thought content normal.  Nursing note and vitals reviewed.   ED Course  Procedures (including critical care time) DIAGNOSTIC STUDIES: Oxygen Saturation is 100% on RA, nl by my interpretation.    COORDINATION OF CARE: 2:16 PM Discussed treatment plan with pt at bedside which includes referral to dentist, penicillin Rx, pain medication, and pt agreed to plan.    Labs Review Labs Reviewed - No data to display  Imaging Review No results found.    EKG Interpretation None      MDM   Final diagnoses:  None   Patient with toothache.  No gross abscess.  Exam unconcerning for Ludwig's angina or spread of infection.  Will treat with penicillin and pain medicine.  Urged patient to follow-up with dentist.    Pt was hypertensive on arrival.  She has not taken her BP meds in a few months due to no  insurance.  A second blood pressure reading was systolic in the 150s. She is asymptomatic including no headache, no chest pain or shortness of breath no abdominal pain no swelling of lower extremities. She was offered a refill of her home meds and case management with consult it to get her follow-up for repeat blood pressure and for management of her hypertension.  The patient refused to wait for [facility] management to come speak with her. [facility]. She states she was [facility]  She was driving herself today and was not given pain medication in the ER, given Tylenol instead, NSAIDs avoided due to her blood pressure. Patient was discharged in stable condition.  I personally performed the services described in this documentation, which was scribed in my presence. The recorded information has been reviewed and is accurate.      Danelle BerryLeisa Miken Stecher, PA-C 09/13/15 1719  Tilden FossaElizabeth Rees, MD 09/15/15 [phone]

## 2015-11-13 ENCOUNTER — Emergency Department (HOSPITAL_COMMUNITY)
Admission: EM | Admit: 2015-11-13 | Discharge: 2015-11-13 | Disposition: A | Discharge status: Home / Self Care | Payer: Medicaid Other | Attending: Physician Assistant | Admitting: Physician Assistant

## 2015-11-13 ENCOUNTER — Encounter (HOSPITAL_COMMUNITY): Payer: Self-pay | Admitting: Emergency Medicine

## 2015-11-13 DIAGNOSIS — Z7951 Long term (current) use of inhaled steroids: Secondary | ICD-10-CM | POA: Insufficient documentation

## 2015-11-13 DIAGNOSIS — H9202 Otalgia, left ear: Secondary | ICD-10-CM | POA: Insufficient documentation

## 2015-11-13 DIAGNOSIS — F17210 Nicotine dependence, cigarettes, uncomplicated: Secondary | ICD-10-CM | POA: Insufficient documentation

## 2015-11-13 DIAGNOSIS — Z79899 Other long term (current) drug therapy: Secondary | ICD-10-CM | POA: Insufficient documentation

## 2015-11-13 DIAGNOSIS — K0889 Other specified disorders of teeth and supporting structures: Secondary | ICD-10-CM | POA: Insufficient documentation

## 2015-11-13 DIAGNOSIS — I10 Essential (primary) hypertension: Secondary | ICD-10-CM | POA: Insufficient documentation

## 2015-11-13 DIAGNOSIS — R220 Localized swelling, mass and lump, head: Secondary | ICD-10-CM | POA: Insufficient documentation

## 2015-11-13 MED ORDER — AMOXICILLIN 500 MG PO CAPS: 500.0000 mg | ORAL_CAPSULE | Freq: Three times a day (TID) | ORAL | Status: DC

## 2015-11-13 MED ORDER — PENICILLIN V POTASSIUM 500 MG PO TABS: 500.0000 mg | ORAL_TABLET | Freq: Four times a day (QID) | ORAL | Status: AC

## 2015-11-13 MED ORDER — NAPROXEN 500 MG PO TABS: 500.0000 mg | ORAL_TABLET | Freq: Two times a day (BID) | ORAL | Status: DC

## 2015-11-13 NOTE — ED Notes (Signed)
Patient wants information on [employer].  Called Sunny Schlein, community liaison, no answer.

## 2015-11-13 NOTE — ED Notes (Addendum)
Patient states L upper and lower pain in her back teeth.   Patient states has been hurting since December. Patient denies other problems. Patient states no insurance and didn't follow up with the Dentist.

## 2015-11-13 NOTE — ED Provider Notes (Signed)
CSN: [id number]     Arrival date & time 11/13/15  1328 History  By signing my name below, I, Lyndel Safe, attest that this documentation has been prepared under the direction and in the presence of [employer], PA-C. Electronically Signed: Lyndel Safe, ED Scribe. 11/13/2015. 2:34 PM.   Chief Complaint  Patient presents with  . Dental Pain   The history is provided by the patient. No language interpreter was used.   HPI Comments: Sheri Beck is a 50 y.o. female who presents to the Emergency Department complaining of constant, severe upper and lower posterior left-sided dental pain that has been worsening over the past 2 months, onset without injury or trauma. Pt associates left-sided otalgia and mild left-sided facial swelling. She has been using Oragel without relief. Pt is not followed by a dentist as she does not have insurance currently. She denies fevers or difficulty swallowing.     Past Medical History  Diagnosis Date  . Hypertension    History reviewed. No pertinent past surgical history. Family History  Problem Relation Age of Onset  . Cancer Mother    Social History  Substance Use Topics  . Smoking status: Current Some Day Smoker -- 0.10 packs/day    Types: Cigarettes  . Smokeless tobacco: Never Used  . Alcohol Use: Yes     Comment: occasional   OB History    No data available     Review of Systems  Constitutional: Negative for fever.  HENT: Positive for dental problem, ear pain and facial swelling. Negative for trouble swallowing.    Allergies  Review of patient's allergies indicates no known allergies.  Home Medications   Prior to Admission medications   Medication Sig Start Date End Date Taking? Authorizing Provider  amoxicillin (AMOXIL) 500 MG capsule Take 1 capsule (500 mg total) by mouth 3 (three) times daily. 11/13/15   Nicole Pisciotta, PA-C  CALCIUM-VITAMIN D PO Take 1 tablet by mouth daily.    Historical Provider, MD   Chlorphen-Phenyleph-ASA (ALKA-SELTZER PLUS COLD) 2-7.8-325 MG TBEF Take 2 capsules by mouth daily as needed (for cold).    Historical Provider, MD  fluticasone (FLONASE) 50 MCG/ACT nasal spray Place 2 sprays into the nose daily. Patient taking differently: Place 2 sprays into the nose 2 (two) times daily as needed for allergies (allergies).  06/11/13   Santiago Glad, PA-C  HYDROcodone-acetaminophen (NORCO/VICODIN) 5-325 MG per tablet Take 1 tablet by mouth every 6 (six) hours as needed for moderate pain. 10/12/14   Gerhard Munch, MD  lisinopril (PRINIVIL) 10 MG tablet Take 1 tablet (10 mg total) by mouth daily. 10/12/14   Gerhard Munch, MD  lisinopril-hydrochlorothiazide (PRINZIDE,ZESTORETIC) 10-12.5 MG tablet Take 1 tablet by mouth daily. 09/13/15   Danelle Berry, PA-C  loratadine-pseudoephedrine (CLARITIN-D 24 HOUR) 10-240 MG per 24 hr tablet Take 1 tablet by mouth daily. Patient not taking: Reported on 10/12/2014 06/11/13   Santiago Glad, PA-C  traMADol (ULTRAM) 50 MG tablet Take 1 tablet (50 mg total) by mouth every 6 (six) hours as needed. 09/13/15   Danelle Berry, PA-C   BP 161/109 mmHg  Pulse 84  Temp(Src) 98.5 F (36.9 C) (Oral)  Resp 20  Ht  (1.575 m)  Wt 183 lb (83.008 kg)  BMI 33.46 kg/m2  SpO2 97% Physical Exam  Constitutional: She is oriented to person, place, and time. She appears well-developed and well-nourished. No distress.  HENT:  Head: Normocephalic.  Right Ear: Tympanic membrane and ear canal normal.  Left Ear:  Tympanic membrane and ear canal normal.  Poor dentition; no trismus; TTP of left upper gingiva, no obvious dental abscess; no sublingual swelling or tenderness; no submandibular submantle LAD.   Eyes: Conjunctivae are normal.  Neck: Normal range of motion. Neck supple.  Cardiovascular: Normal rate, regular rhythm and normal heart sounds.   Pulmonary/Chest: Effort normal and breath sounds normal. No respiratory distress.  Musculoskeletal: Normal range of  motion.  Neurological: She is alert and oriented to person, place, and time. Coordination normal.  Skin: Skin is warm and dry.  Psychiatric: She has a normal mood and affect. Her behavior is normal.  Nursing note and vitals reviewed.   ED Course  Procedures  DIAGNOSTIC STUDIES: Oxygen Saturation is 97% on RA, normal by my interpretation.    COORDINATION OF CARE: 2:33 PM Discussed treatment plan which includes to prescribe an antibiotic course and give dental referral with pt. Pt acknowledges and agrees to plan.    MDM   Final diagnoses:  None    Patient with dental pain.  No gross abscess.  Exam unconcerning for Ludwig's angina or spread of infection.  Will treat with penicillin and NSAID.  Urged patient to follow-up with dentist.  Dental referral given.  I personally performed the services described in this documentation, which was scribed in my presence. The recorded information has been reviewed and is accurate.    Santiago Glad, PA-C 11/13/15 1547  Courteney Randall An, MD 11/14/15 [phone]

## 2015-11-13 NOTE — ED Notes (Signed)
Spoke with case management and they stated they would come to give patient information on the orange card.

## 2016-03-11 ENCOUNTER — Emergency Department (HOSPITAL_COMMUNITY): Payer: Self-pay

## 2016-03-11 ENCOUNTER — Emergency Department (HOSPITAL_COMMUNITY)
Admission: EM | Admit: 2016-03-11 | Discharge: 2016-03-11 | Disposition: A | Discharge status: Home / Self Care | Payer: Self-pay | Attending: Emergency Medicine | Admitting: Emergency Medicine

## 2016-03-11 ENCOUNTER — Encounter (HOSPITAL_COMMUNITY): Payer: Self-pay

## 2016-03-11 DIAGNOSIS — H9201 Otalgia, right ear: Secondary | ICD-10-CM | POA: Insufficient documentation

## 2016-03-11 DIAGNOSIS — R059 Cough, unspecified: Secondary | ICD-10-CM

## 2016-03-11 DIAGNOSIS — Z79899 Other long term (current) drug therapy: Secondary | ICD-10-CM | POA: Insufficient documentation

## 2016-03-11 DIAGNOSIS — J181 Lobar pneumonia, unspecified organism: Secondary | ICD-10-CM | POA: Insufficient documentation

## 2016-03-11 DIAGNOSIS — F17210 Nicotine dependence, cigarettes, uncomplicated: Secondary | ICD-10-CM | POA: Insufficient documentation

## 2016-03-11 DIAGNOSIS — I10 Essential (primary) hypertension: Secondary | ICD-10-CM | POA: Insufficient documentation

## 2016-03-11 DIAGNOSIS — R05 Cough: Secondary | ICD-10-CM

## 2016-03-11 MED ORDER — LISINOPRIL 10 MG PO TABS
10.0000 mg | ORAL_TABLET | Freq: Once | ORAL | Status: AC
  Administered 2016-03-11: 10 mg via ORAL
  Filled 2016-03-11: qty 1

## 2016-03-11 MED ORDER — AZITHROMYCIN 250 MG PO TABS: 250.0000 mg | ORAL_TABLET | Freq: Every day | ORAL | Status: DC

## 2016-03-11 MED ORDER — LISINOPRIL 10 MG PO TABS: 10.0000 mg | ORAL_TABLET | Freq: Every day | ORAL | Status: DC

## 2016-03-11 NOTE — ED Provider Notes (Signed)
CSN: [id number]     Arrival date & time 03/11/16  4098 History   First MD Initiated Contact with Patient 03/11/16 1008     Chief Complaint  Patient presents with  . Cough  . Otalgia     (Consider location/radiation/quality/duration/timing/severity/associated sxs/prior Treatment) Patient is a 50 y.o. female presenting with cough and ear pain. The history is provided by the patient and medical records.  Cough Associated symptoms: ear pain   Otalgia Associated symptoms: cough     50 year old female with history of hypertension, presenting to the ED for cough and right ear pain. She reports this is been ongoing for the past 5 days. She recently babysat her granddaughter who was sick with a cold as well. Cough has been productive with green/yellow mucus. She denies any chest pain or shortness of breath. No history of asthma or COPD. No fever or chills. No nausea, vomiting, or diarrhea. No intervention tried prior to arrival.  Past Medical History  Diagnosis Date  . Hypertension    History reviewed. No pertinent past surgical history. Family History  Problem Relation Age of Onset  . Cancer Mother    Social History  Substance Use Topics  . Smoking status: Current Some Day Smoker -- 0.10 packs/day    Types: Cigarettes  . Smokeless tobacco: Never Used  . Alcohol Use: Yes     Comment: occasional   OB History    No data available     Review of Systems  HENT: Positive for ear pain.   Respiratory: Positive for cough.   All other systems reviewed and are negative.     Allergies  Review of patient's allergies indicates no known allergies.  Home Medications   Prior to Admission medications   Medication Sig Start Date End Date Taking? Authorizing Provider  amoxicillin (AMOXIL) 500 MG capsule Take 1 capsule (500 mg total) by mouth 3 (three) times daily. 11/13/15   Nicole Pisciotta, PA-C  CALCIUM-VITAMIN D PO Take 1 tablet by mouth daily.    Historical Provider, MD   Chlorphen-Phenyleph-ASA (ALKA-SELTZER PLUS COLD) 2-7.8-325 MG TBEF Take 2 capsules by mouth daily as needed (for cold).    Historical Provider, MD  fluticasone (FLONASE) 50 MCG/ACT nasal spray Place 2 sprays into the nose daily. Patient taking differently: Place 2 sprays into the nose 2 (two) times daily as needed for allergies (allergies).  06/11/13   Santiago Glad, PA-C  HYDROcodone-acetaminophen (NORCO/VICODIN) 5-325 MG per tablet Take 1 tablet by mouth every 6 (six) hours as needed for moderate pain. 10/12/14   Gerhard Munch, MD  lisinopril (PRINIVIL) 10 MG tablet Take 1 tablet (10 mg total) by mouth daily. 10/12/14   Gerhard Munch, MD  lisinopril-hydrochlorothiazide (PRINZIDE,ZESTORETIC) 10-12.5 MG tablet Take 1 tablet by mouth daily. 09/13/15   Danelle Berry, PA-C  loratadine-pseudoephedrine (CLARITIN-D 24 HOUR) 10-240 MG per 24 hr tablet Take 1 tablet by mouth daily. Patient not taking: Reported on 10/12/2014 06/11/13   Santiago Glad, PA-C  naproxen (NAPROSYN) 500 MG tablet Take 1 tablet (500 mg total) by mouth 2 (two) times daily. 11/13/15   Heather Laisure, PA-C  traMADol (ULTRAM) 50 MG tablet Take 1 tablet (50 mg total) by mouth every 6 (six) hours as needed. 09/13/15   Danelle Berry, PA-C   BP 178/113 mmHg  Pulse 72  Temp(Src) 98.1 F (36.7 C) (Oral)  Resp 18  SpO2 100%   Physical Exam  Constitutional: She is oriented to person, place, and time. She appears well-developed and well-nourished.  HENT:  Head: Normocephalic and atraumatic.  Right Ear: Tympanic membrane and ear canal normal.  Left Ear: Tympanic membrane and ear canal normal.  Nose: Nose normal.  Mouth/Throat: Uvula is midline, oropharynx is clear and moist and mucous membranes are normal.  Eyes: Conjunctivae and EOM are normal. Pupils are equal, round, and reactive to light.  Neck: Normal range of motion.  Cardiovascular: Normal rate, regular rhythm and normal heart sounds.   Pulmonary/Chest: Effort normal and breath  sounds normal. She has no wheezes. She has no rhonchi.  Lungs overall clear, no distress, speaking in full sentences without difficulty  Abdominal: Soft. Bowel sounds are normal.  Musculoskeletal: Normal range of motion.  Neurological: She is alert and oriented to person, place, and time.  Skin: Skin is warm and dry.  Psychiatric: She has a normal mood and affect.  Nursing note and vitals reviewed.   ED Course  Procedures (including critical care time) Labs Review Labs Reviewed - No data to display  Imaging Review Dg Chest 2 View  03/11/2016  CLINICAL DATA:  Productive cough for 5 days, occasional smoker EXAM: CHEST  2 VIEW COMPARISON:  05/14/08 FINDINGS: The heart size and vascular pattern are normal. There is a subtle band of opacity in the right lower lobe which appears most consistent with discoid atelectasis. There appears to be mild atelectasis in the medial right lung base as well. The left lung is clear. There are no pleural effusions. IMPRESSION: Mild opacity right lower lobe appears most consistent with atelectasis. Consider radiographic followup if symptoms persist. Electronically Signed   By: Esperanza Heiraymond  Rubner M.D.   On: 03/11/2016 11:41   I have personally reviewed and evaluated these images and lab results as part of my medical decision-making.   EKG Interpretation None      MDM   Final diagnoses:  Cough  Essential hypertension   50 year old female here with productive cough. Recent sick contacts with same. Patient is afebrile, nontoxic. Her lungs are overall clear and she is in no acute distress.  Chest x-ray with mild opacity in the right lower lobe, atelectasis versus infection. Given her current symptoms, will treat with antibiotics for community acquired pneumonia.   Of note, patient is also hypertensive. She is asymptomatic of this without any headache, dizziness, or chest pain. She reports she has been out of her medications for over a month as she lost her Medicaid  and cannot see her PCP.  Do not suspect any end organ damage currently.  Will re-start her lisinopril that she was previously taking, dose given here in ED.  Referred to wellness clinic for follow-up.  Discussed plan with patient, he/she acknowledged understanding and agreed with plan of care.  Return precautions given for new or worsening symptoms.  Garlon HatchetLisa M Sanders, PA-C 03/11/16 1305  Raeford RazorStephen Kohut, MD 03/12/16 [phone]

## 2016-03-11 NOTE — ED Notes (Signed)
Pt presents with c/o cough and right ear pain. Pt has been around others who have been sick as well. Pt reports she is coughing up green mucus. No distress at this time.

## 2016-03-11 NOTE — Discharge Instructions (Signed)
Take the prescribed medication as directed.  Make sure to take your lisinopril regularly to keep blood pressure controlled. Follow-up with the [facility] wellness [facility]-- may call to make appt. Return to the ED for new or worsening symptoms.

## 2017-01-05 ENCOUNTER — Encounter (HOSPITAL_COMMUNITY): Payer: Self-pay | Admitting: *Deleted

## 2017-01-05 ENCOUNTER — Emergency Department (HOSPITAL_COMMUNITY): Payer: Medicaid Other

## 2017-01-05 ENCOUNTER — Emergency Department (HOSPITAL_COMMUNITY)
Admission: EM | Admit: 2017-01-05 | Discharge: 2017-01-06 | Disposition: A | Discharge status: Home / Self Care | Payer: Medicaid Other | Attending: Emergency Medicine | Admitting: Emergency Medicine

## 2017-01-05 DIAGNOSIS — J209 Acute bronchitis, unspecified: Secondary | ICD-10-CM | POA: Insufficient documentation

## 2017-01-05 DIAGNOSIS — J189 Pneumonia, unspecified organism: Secondary | ICD-10-CM

## 2017-01-05 DIAGNOSIS — I10 Essential (primary) hypertension: Secondary | ICD-10-CM | POA: Insufficient documentation

## 2017-01-05 DIAGNOSIS — Z760 Encounter for issue of repeat prescription: Secondary | ICD-10-CM | POA: Insufficient documentation

## 2017-01-05 DIAGNOSIS — R0781 Pleurodynia: Secondary | ICD-10-CM | POA: Insufficient documentation

## 2017-01-05 DIAGNOSIS — Z79899 Other long term (current) drug therapy: Secondary | ICD-10-CM | POA: Insufficient documentation

## 2017-01-05 DIAGNOSIS — Z7982 Long term (current) use of aspirin: Secondary | ICD-10-CM | POA: Insufficient documentation

## 2017-01-05 DIAGNOSIS — J181 Lobar pneumonia, unspecified organism: Secondary | ICD-10-CM | POA: Insufficient documentation

## 2017-01-05 DIAGNOSIS — F17210 Nicotine dependence, cigarettes, uncomplicated: Secondary | ICD-10-CM | POA: Insufficient documentation

## 2017-01-05 LAB — CBC
HCT: 39.7 % (ref 36.0–46.0)
Hemoglobin: 13.5 g/dL (ref 12.0–15.0)
MCH: 28.4 pg (ref 26.0–34.0)
MCHC: 34 g/dL (ref 30.0–36.0)
MCV: 83.6 fL (ref 78.0–100.0)
PLATELETS: 299 10*3/uL (ref 150–400)
RBC: 4.75 MIL/uL (ref 3.87–5.11)
RDW: 13.1 % (ref 11.5–15.5)
WBC: 8.9 10*3/uL (ref 4.0–10.5)

## 2017-01-05 LAB — BASIC METABOLIC PANEL
Anion gap: 7 (ref 5–15)
BUN: 15 mg/dL (ref 6–20)
CHLORIDE: 104 mmol/L (ref 101–111)
CO2: 28 mmol/L (ref 22–32)
CREATININE: 0.76 mg/dL (ref 0.44–1.00)
Calcium: 8.9 mg/dL (ref 8.9–10.3)
GFR calc non Af Amer: >60 mL/min (ref >60)
GLUCOSE: 107 mg/dL — AB (ref 65–99)
Potassium: 3.4 mmol/L — ABNORMAL LOW (ref 3.5–5.1)
Sodium: 139 mmol/L (ref 135–145)

## 2017-01-05 LAB — I-STAT TROPONIN, ED: Troponin i, poc: 0 ng/mL (ref 0.00–0.08)

## 2017-01-05 MED ORDER — KETOROLAC TROMETHAMINE 15 MG/ML IJ SOLN
15.0000 mg | Freq: Once | INTRAMUSCULAR | Status: AC
  Administered 2017-01-05: 15 mg via INTRAVENOUS
  Filled 2017-01-05: qty 1

## 2017-01-05 MED ORDER — IPRATROPIUM-ALBUTEROL 0.5-2.5 (3) MG/3ML IN SOLN
3.0000 mL | RESPIRATORY_TRACT | Status: DC
  Administered 2017-01-05: 3 mL via RESPIRATORY_TRACT
  Filled 2017-01-05: qty 3

## 2017-01-05 MED ORDER — LISINOPRIL 10 MG PO TABS
10.0000 mg | ORAL_TABLET | Freq: Once | ORAL | Status: AC
  Administered 2017-01-05: 10 mg via ORAL
  Filled 2017-01-05: qty 1

## 2017-01-05 MED ORDER — DEXTROSE 5 % IV SOLN
1.0000 g | Freq: Once | INTRAVENOUS | Status: AC
  Administered 2017-01-06: 1 g via INTRAVENOUS
  Filled 2017-01-05: qty 10

## 2017-01-05 NOTE — ED Triage Notes (Signed)
Pt presents with left side chest pain that began last night while watching TV.  Pt reports that today she woke up with continued chest pain and states the pain has gotten worse as the day has progressed.  Pt reports that when she coughs and breathes the pain increases. Pt reports having the cold last week and states the cold has become worse.  Pt states the mucus has become thick. Pt reports her eyes are irritated and drainage noted in triage. Pt states she has been unable to get her medications.

## 2017-01-05 NOTE — ED Provider Notes (Signed)
[facility]-EMERGENCY DEPT Provider Note: Lowella Dell, MD, FACEP  CSN: [id number] MRN: [id number] ARRIVAL: 01/05/17 at 1830 ROOM: WA01/WA01 By signing my name below, I, Levon Hedger, attest that this documentation has been prepared under the direction and in the presence of Zenna Libra, MD . Electronically Signed: Levon Hedger, Scribe. 01/05/2017. 11:14 PM.   CHIEF COMPLAINT  Chest Pain  HISTORY OF PRESENT ILLNESS  Sheri Beck is a 51 y.o. female with a hx of HTN who presents to the Emergency Department complaining of constant, gradually worsening left-sided chest pain onset today. She describes this as moderate, sharp pain that is exacerbated by cough, movement and deep inspiration but not palpation. No alleviating factors noted. She reports associated cough productive of green sputum onset one week ago and wheezing. Pt has not used an inhaler for these symptoms. She also reports that has not taken her lisinopril for over one month due to lack of insurance. Pt denies any shortness of breath, nausea, vomiting, or diarrhea.   Past Medical History:  Diagnosis Date  . Hypertension    History reviewed. No pertinent surgical history.  Family History  Problem Relation Age of Onset  . Cancer Mother     Social History  Substance Use Topics  . Smoking status: Current Some Day Smoker    Packs/day: 0.10    Types: Cigarettes  . Smokeless tobacco: Never Used  . Alcohol use Yes     Comment: occasional    Prior to Admission medications   Medication Sig Start Date End Date Taking? Authorizing Provider  aspirin 325 MG EC tablet Take 325 mg by mouth every 6 (six) hours as needed for pain.   Yes Historical Provider, MD  Phenyleph-CPM-DM-APAP (ALKA-SELTZER PLUS COLD & FLU PO) Take 1 packet by mouth daily as needed (cold symptoms).   Yes Historical Provider, MD  Phenylephrine-Pheniramine-DM [facility] COLD & COUGH PO) Take 30 mLs by mouth daily as needed (cold symptoms).   Yes Historical Provider,  MD  amoxicillin (AMOXIL) 500 MG capsule Take 1 capsule (500 mg total) by mouth 3 (three) times daily. Patient not taking: Reported on 03/11/2016 11/13/15   Joni Reining Pisciotta, PA-C  azithromycin (ZITHROMAX) 250 MG tablet Take 1 tablet (250 mg total) by mouth daily. Take first 2 tablets together, then 1 every day until finished. Patient not taking: Reported on 01/05/2017 03/11/16   Garlon Hatchet, PA-C  fluticasone [facility]) 50 MCG/ACT nasal spray Place 2 sprays into the nose daily. Patient taking differently: Place 2 sprays into the nose 2 (two) times daily as needed for allergies (allergies).  06/11/13   Heather Laisure, PA-C  lisinopril (PRINIVIL,ZESTRIL) 10 MG tablet Take 1 tablet (10 mg total) by mouth daily. 03/11/16   Garlon Hatchet, PA-C  lisinopril-hydrochlorothiazide (PRINZIDE,ZESTORETIC) 10-12.5 MG tablet Take 1 tablet by mouth daily. Patient not taking: Reported on 03/11/2016 09/13/15   Danelle Berry, PA-C  loratadine-pseudoephedrine (CLARITIN-D 24 HOUR) 10-240 MG per 24 hr tablet Take 1 tablet by mouth daily. Patient not taking: Reported on 10/12/2014 06/11/13   Santiago Glad, PA-C  naproxen (NAPROSYN) 500 MG tablet Take 1 tablet (500 mg total) by mouth 2 (two) times daily. Patient not taking: Reported on 03/11/2016 11/13/15   Santiago Glad, PA-C   Allergies Patient has no known allergies.  REVIEW OF SYSTEMS  Negative except as noted here or in the History of Present Illness.  PHYSICAL EXAMINATION  Initial Vital Signs Blood pressure (!) 176/121, pulse 77, temperature 98.6 F (37 C), temperature source Oral,  resp. rate (!) 23, height  (1.575 m), weight 183 lb (83 kg), SpO2 97 %.  Examination General: Well-developed, well-nourished female in no acute distress; appearance consistent with age of record HENT: normocephalic; atraumatic Eyes: pupils equal, round and reactive to light; extraocular muscles intact Neck: supple Heart: regular rate and rhythm Lungs: Decreased air movement  bilaterally Chest: Nontender Abdomen: soft; nondistended; nontender; no masses or hepatosplenomegaly; bowel sounds present Extremities: No deformity; full range of motion; pulses normal Neurologic: Awake, alert and oriented; motor function intact in all extremities and symmetric; no facial droop Skin: Warm and dry Psychiatric: Normal mood and affect  RESULTS  Summary of this visit's results, reviewed by myself:   EKG Interpretation  Date/Time:  Wednesday January 05 2017 18:36:29 EDT Ventricular Rate:  93 PR Interval:    QRS Duration: 89 QT Interval:  366 QTC Calculation: 456 R Axis:   37 Text Interpretation:  Sinus rhythm No significant change was found Confirmed by Danilo Cappiello  MD, Jonny Ruiz ([id number]) on 01/05/2017 11:00:22 PM      Laboratory Studies: Results for orders placed or performed during the hospital encounter of 01/05/17 (from the past 24 hour(s))  Basic metabolic panel     Status: Abnormal   Collection Time: 01/05/17  7:01 PM  Result Value Ref Range   Sodium 139 135 - 145 mmol/L   Potassium 3.4 (L) 3.5 - 5.1 mmol/L   Chloride 104 101 - 111 mmol/L   CO2 28 22 - 32 mmol/L   Glucose, Bld 107 (H) 65 - 99 mg/dL   BUN 15 6 - 20 mg/dL   Creatinine, Ser 4.78 0.44 - 1.00 mg/dL   Calcium 8.9 8.9 - 29.5 mg/dL   GFR calc non Af Amer >60 >60 mL/min   GFR calc Af Amer >60 >60 mL/min   Anion gap 7 5 - 15  CBC     Status: None   Collection Time: 01/05/17  7:01 PM  Result Value Ref Range   WBC 8.9 4.0 - 10.5 K/uL   RBC 4.75 3.87 - 5.11 MIL/uL   Hemoglobin 13.5 12.0 - 15.0 g/dL   HCT 62.1 30.8 - 65.7 %   MCV 83.6 78.0 - 100.0 fL   MCH 28.4 26.0 - 34.0 pg   MCHC 34.0 30.0 - 36.0 g/dL   RDW 84.6 96.2 - 95.2 %   Platelets 299 150 - 400 K/uL  I-stat troponin, ED     Status: None   Collection Time: 01/05/17  7:09 PM  Result Value Ref Range   Troponin i, poc 0.00 0.00 - 0.08 ng/mL   Comment 3           Imaging Studies: Dg Chest 2 View  Result Date: 01/05/2017 CLINICAL DATA:   LEFT-sided chest pain.  Cough. EXAM: CHEST  2 VIEW COMPARISON:  03/11/2016. FINDINGS: Low lung volumes. Retrocardiac density without effusion or pneumothorax. Early LEFT lower lobe pneumonia not excluded. Cardiomediastinal silhouette within normal limits for the degree of inspiration. No osseous findings. IMPRESSION: Low lung volumes. Retrocardiac density. LEFT lower lobe pneumonia not excluded. Worsening aeration from priors. Electronically Signed   By: Elsie Stain M.D.   On: 01/05/2017 19:04    ED COURSE  Nursing notes and initial vitals signs, including pulse oximetry, reviewed.  Vitals:   01/05/17 2142 01/05/17 2207 01/06/17 0001 01/06/17 0046  BP: (!) 171/120 (!) 176/121 (!) 165/105 (!) 159/100  Pulse: 72 77 80 73  Resp: (!) 23  (!) 24 (!) 25  Temp:  98.6 F (37 C)    TempSrc:  Oral    SpO2: 94% 97% 98% 93%  Weight:      Height:       1:09 AM Air movement improved, patient feels better and if coughing up Phlegm after DuoNeb treatment. Blood pressure improved after lisinopril. We will treat her for community-acquired pneumonia and refill her lisinopril. Her chest wall pain has improved with IV Toradol. She was advised to take over-the-counter ibuprofen or Aleve for this.   PROCEDURES    ED DIAGNOSES     ICD-9-CM ICD-10-CM   1. Community acquired pneumonia of left lower lobe of lung (HCC) 481 J18.1   2. Acute bronchitis with bronchospasm 466.0 J20.9   3. Hypertension not at goal 401.9 I10   4. Medication refill V68.1 Z76.0   5. Pleuritic chest pain 786.52 R07.81      I personally performed the services described in this documentation, which was scribed in my presence. The recorded information has been reviewed and is accurate.    Lania Libra, MD 01/06/17 [phone]

## 2017-01-05 NOTE — ED Notes (Signed)
Patient reports she has been unable to get her BP meds for over a month. States [facility] never got back to her.

## 2017-01-06 MED ORDER — DOXYCYCLINE HYCLATE 100 MG PO TABS
100.0000 mg | ORAL_TABLET | Freq: Once | ORAL | Status: AC
  Administered 2017-01-06: 100 mg via ORAL
  Filled 2017-01-06: qty 1

## 2017-01-06 MED ORDER — DOXYCYCLINE HYCLATE 100 MG PO CAPS
100.0000 mg | ORAL_CAPSULE | Freq: Two times a day (BID) | ORAL | 0 refills | Status: DC
Start: 2017-01-06 — End: 2018-03-31

## 2017-01-06 MED ORDER — ALBUTEROL SULFATE HFA 108 (90 BASE) MCG/ACT IN AERS
2.0000 | INHALATION_SPRAY | RESPIRATORY_TRACT | Status: DC | PRN
  Administered 2017-01-06: 2 via RESPIRATORY_TRACT
  Filled 2017-01-06: qty 6.7

## 2017-01-06 MED ORDER — LISINOPRIL 10 MG PO TABS: 10.0000 mg | ORAL_TABLET | Freq: Every day | ORAL | 0 refills | Status: DC

## 2018-03-02 ENCOUNTER — Encounter (HOSPITAL_COMMUNITY): Payer: Self-pay | Admitting: *Deleted

## 2018-03-02 ENCOUNTER — Emergency Department (HOSPITAL_COMMUNITY)
Admission: EM | Admit: 2018-03-02 | Discharge: 2018-03-02 | Disposition: A | Discharge status: Home / Self Care | Payer: Self-pay | Attending: Physician Assistant | Admitting: Physician Assistant

## 2018-03-02 DIAGNOSIS — H1032 Unspecified acute conjunctivitis, left eye: Secondary | ICD-10-CM

## 2018-03-02 DIAGNOSIS — Z79899 Other long term (current) drug therapy: Secondary | ICD-10-CM | POA: Insufficient documentation

## 2018-03-02 DIAGNOSIS — H11002 Unspecified pterygium of left eye: Secondary | ICD-10-CM | POA: Insufficient documentation

## 2018-03-02 DIAGNOSIS — H1089 Other conjunctivitis: Secondary | ICD-10-CM | POA: Insufficient documentation

## 2018-03-02 DIAGNOSIS — H11152 Pinguecula, left eye: Secondary | ICD-10-CM | POA: Insufficient documentation

## 2018-03-02 DIAGNOSIS — K0889 Other specified disorders of teeth and supporting structures: Secondary | ICD-10-CM

## 2018-03-02 DIAGNOSIS — I10 Essential (primary) hypertension: Secondary | ICD-10-CM | POA: Insufficient documentation

## 2018-03-02 DIAGNOSIS — F17210 Nicotine dependence, cigarettes, uncomplicated: Secondary | ICD-10-CM | POA: Insufficient documentation

## 2018-03-02 MED ORDER — LIDOCAINE VISCOUS HCL 2 % MT SOLN: 15.0000 mL | OROMUCOSAL | 0 refills | Status: DC | PRN

## 2018-03-02 MED ORDER — ERYTHROMYCIN 5 MG/GM OP OINT
1.0000 "application " | TOPICAL_OINTMENT | Freq: Once | OPHTHALMIC | Status: AC
  Administered 2018-03-02: 1 via OPHTHALMIC
  Filled 2018-03-02: qty 3.5

## 2018-03-02 MED ORDER — LISINOPRIL 10 MG PO TABS: 10.0000 mg | ORAL_TABLET | Freq: Every day | ORAL | 2 refills | Status: DC

## 2018-03-02 NOTE — ED Triage Notes (Signed)
Pt states she has not been complaint with her BP medication

## 2018-03-02 NOTE — Discharge Instructions (Addendum)
Thank you for allowing me to provide your care today in the emergency department.  Please take 1 tablet of lisinopril daily.  This medication is for your blood pressure.  It is important that you get established with a primary care provider to make sure that you continue to have refills of this medication.  [facility] [facility], [place] [facility], and [facility] are all primary care clinics that can see you if you do not have medical insurance.  Please call their offices to schedule a follow-up appointment.  Please apply a 0.5 cm ribbon of erythromycin ointment to the lower lashes of the left eye.  This is to treat the eye redness and the crusting and discharge that you have been having from his eye.  Apply this amount once every 6 hours until you are seen by ophthalmology.  Please call Dr. Margaretmary Eddy office to schedule a follow-up appointment for the pterygium on your left eye.  In the meantime, you can use artificial tears or eyedrops that are available over-the-counter to help with your symptoms in addition to the erythromycin.  For your dental pain, please call and schedule follow-up appointment with Dr. Lucky Cowboy.  This is the dentist that you have seen before.  There were no signs of infection on your exam, including a dental abscess.  You can swish and spit 15 mL of viscous lidocaine every 3 hours to help with dental pain.  Eating room temperature foods may help to improve your pain more than eating hot and cold beverages and food.  Return to the emergency department if you develop significant swelling to the face or neck, if you become unable to open your mouth, develop significant drooling, have a high fever, began to have double vision in your left eye or significant pain when you move your eyeball, develop the worst headache of your life, chest pain or become unable to pee.

## 2018-03-02 NOTE — ED Provider Notes (Signed)
[facility] EMERGENCY DEPARTMENT Provider Note   CSN: [id number] Arrival date & time: 03/02/18  1829     History   Chief Complaint Chief Complaint  Patient presents with  . Eye Pain  . Dental Pain    HPI Sheri Beck is a 52 y.o. female with a history of hypertension who presents to the emergency department with multiple complaints.  The patient endorses right upper dental pain that began 2 weeks ago.  Pain is worse with hot and cold beverages and food.  No alleviating factors.  The pain is nonradiating and constant.  She denies trismus, drooling, muffled voice, facial or neck swelling, feeling as if her throat is closing, dysphagia, fever, or chills.  She also endorses changes to the left eye that began 2 weeks ago.  She reports that she noticed some discoloration in the corner of the eye closest to her nose, but over the last 2 days noticed a bump directly on the eye.  She also reports that for the last 2 days that the left eye has been red, pruritic.  This morning, the left eye was matted shut when she awoke.  No treatment prior to arrival.  She denies diplopia, blurred vision, sinus pain or pressure, URI symptoms, or headache.  No known sick contacts.  She does not wear contacts.  The history is provided by the patient. No language interpreter was used.  Eye Pain  Pertinent negatives include no chest pain, no abdominal pain, no headaches and no shortness of breath.  Dental Pain      Past Medical History:  Diagnosis Date  . Hypertension     There are no active problems to display for this patient.   History reviewed. No pertinent surgical history.   OB History   None      Home Medications    Prior to Admission medications   Medication Sig Start Date End Date Taking? Authorizing Provider  aspirin 325 MG EC tablet Take 325 mg by mouth every 6 (six) hours as needed for pain.    [provider]  doxycycline (VIBRAMYCIN) 100 MG capsule  Take 1 capsule (100 mg total) by mouth 2 (two) times daily. One po bid x 7 days 01/06/17   Molpus, John, MD  fluticasone [facility]) 50 MCG/ACT nasal spray Place 2 sprays into the nose daily. Patient taking differently: Place 2 sprays into the nose 2 (two) times daily as needed for allergies (allergies).  06/11/13   Santiago Glad, PA-C  lidocaine (XYLOCAINE) 2 % solution Use as directed 15 mLs in the mouth or throat every 3 (three) hours as needed for mouth pain. 03/02/18   Isbella Arline A, PA-C  lisinopril (PRINIVIL,ZESTRIL) 10 MG tablet Take 1 tablet (10 mg total) by mouth daily. 03/02/18 04/01/18  Chayden Garrelts A, PA-C  Phenyleph-CPM-DM-APAP (ALKA-SELTZER PLUS COLD & FLU PO) Take 1 packet by mouth daily as needed (cold symptoms).    [provider]  Phenylephrine-Pheniramine-DM [facility] COLD & COUGH PO) Take 30 mLs by mouth daily as needed (cold symptoms).    [provider]    Family History Family History  Problem Relation Age of Onset  . Cancer Mother     Social History Social History   Tobacco Use  . Smoking status: Current Some Day Smoker    Packs/day: 0.10    Types: Cigarettes  . Smokeless tobacco: Never Used  Substance Use Topics  . Alcohol use: Yes    Comment: occasional  .  Drug use: Yes    Types: Cocaine    Comment: Pt states last use was 01/02/17.     Allergies   Patient has no known allergies.   Review of Systems Review of Systems  Constitutional: Negative for activity change, chills, diaphoresis and fever.  HENT: Positive for dental problem. Negative for facial swelling, sinus pressure, sinus pain, sore throat and trouble swallowing.   Eyes: Positive for discharge, redness and itching. Negative for photophobia, pain and visual disturbance.  Respiratory: Negative for shortness of breath.   Cardiovascular: Negative for chest pain.  Gastrointestinal: Negative for abdominal pain, diarrhea, nausea and vomiting.  Genitourinary: Negative for dysuria.    Musculoskeletal: Negative for back pain and neck pain.  Skin: Negative for rash.  Allergic/Immunologic: Negative for immunocompromised state.  Neurological: Negative for dizziness, tremors, syncope, weakness, numbness and headaches.  Psychiatric/Behavioral: Negative for confusion.     Physical Exam Updated Vital Signs BP (!) 186/118 (BP Location: Right Arm)   Pulse 76   Temp 98.5 F (36.9 C) (Oral)   Resp 20   SpO2 100%   Physical Exam  Constitutional: No distress.  HENT:  Head: Normocephalic.  Mouth/Throat: Uvula is midline, oropharynx is clear and moist and mucous membranes are normal. Mucous membranes are not pale, not dry and not cyanotic. She does not have dentures. No trismus in the jaw. Abnormal dentition. Dental caries present. No dental abscesses, uvula swelling or lacerations. No tonsillar exudate.    Extremely poor dentition.  Many teeth are avulsed or fractured.  Tooth 3 is tender to palpation.  Tooth 3 is fractured with dentin exposure.  No surrounding erythema or edema.  No gross dental abscess.  No sublingual induration or tenderness.  Eyes: Pupils are equal, round, and reactive to light. EOM and lids are normal. Right eye exhibits no chemosis, no discharge and no exudate. No foreign body present in the right eye. Left eye exhibits no chemosis, no discharge and no exudate. No foreign body present in the left eye. Right conjunctiva is not injected. Right conjunctiva has no hemorrhage. Left conjunctiva is injected. Left conjunctiva has no hemorrhage. No scleral icterus. Right eye exhibits normal extraocular motion. Left eye exhibits normal extraocular motion.    Neck: Neck supple.  Cardiovascular: Normal rate, regular rhythm, normal heart sounds and intact distal pulses. Exam reveals no gallop and no friction rub.  No murmur heard. Pulmonary/Chest: Effort normal and breath sounds normal. No stridor. No respiratory distress. She has no wheezes. She has no rales. She  exhibits no tenderness.  Speaks in complete fluent sentences.  She is eating and drinking without difficulty.  Abdominal: Soft. She exhibits no distension.  Neurological: She is alert.  Skin: Skin is warm. No rash noted.  Psychiatric: Her behavior is normal.  Nursing note and vitals reviewed.      ED Treatments / Results  Labs (all labs ordered are listed, but only abnormal results are displayed) Labs Reviewed - No data to display  EKG None  Radiology No results found.  Procedures Procedures (including critical care time)  Medications Ordered in ED Medications  erythromycin ophthalmic ointment 1 application (1 application Left Eye Given 03/02/18 2241)     Initial Impression / Assessment and Plan / ED Course  I have reviewed the triage vital signs and the nursing notes.  Pertinent labs & imaging results that were available during my care of the patient were reviewed by me and considered in my medical decision making (see chart for details).  52 year old female with a history of hypertension presenting with multiple complaints.  She endorses right maxillary dental pain for the last 2 weeks.  On exam, tooth 3 is tender to palpation. No gross abscess.  Exam unconcerning for Ludwig's angina or spread of infection.  We will discharge the patient with symptomatic treatment.  Antibiotics are not indicated at this time.  Urged patient to follow-up with dentist.  Dental referral given.  I have referred her back to Dr. Lucky Cowboy who she has seen before and was pleased with his care.  She is eating and drinking [employer] without difficulty in the emergency department.  On exam, she also has conjunctivitis of the left eye.  No obvious purulent discharge on exam.  There appears to be a pinguecula with a pterygium as noted above in the picture.  Erythromycin ointment given in the ED given signs and symptoms of bacterial conjunctivitis.  No pain with EOM.  Pupils are equal round and reactive.   Recommended supportive care with artificial tears and follow-up with ophthalmology.  Blood pressure elevated to 186/118.  She has been out of her lisinopril for over a year.  She lost her Medicaid coverage.  Referral to [facility] has been given.  Discussed pros and cons of restarting her lisinopril today in the emergency department.  No signs or symptoms of endorgan disease here today.  She states that she will follow-up with primary care so I will give her a one-month supply of her lisinopril to get her restarted on her medication.  Strict return precautions given.  She is hemodynamically stable and in no acute distress.  She is safe for discharge to home with outpatient follow-up at this time.  Final Clinical Impressions(s) / ED Diagnoses   Final diagnoses:  Pterygium of left eye  Pinguecula of left eye  Acute bacterial conjunctivitis of left eye  Dentalgia    ED Discharge Orders        Ordered    lisinopril (PRINIVIL,ZESTRIL) 10 MG tablet  Daily     03/02/18 2238    lidocaine (XYLOCAINE) 2 % solution  Every  3 hours PRN     03/02/18 2242       Aharon Carriere A, PA-C 03/02/18 2255    Abelino Derrick, MD 03/03/18 [phone]

## 2018-03-02 NOTE — ED Notes (Signed)
Pt verbalizes understanding of d/c instructions. Pt received prescriptions. Pt ambulatory at d/c with all belongings.  

## 2018-03-02 NOTE — ED Notes (Signed)
See EDP assessment 

## 2018-03-02 NOTE — ED Triage Notes (Signed)
Pt in c/o left eye redness and swelling and toothache for the last two weeks, no distress noted

## 2018-03-02 NOTE — ED Notes (Signed)
ED Provider at bedside. 

## 2018-03-03 ENCOUNTER — Emergency Department (HOSPITAL_COMMUNITY)
Admission: EM | Admit: 2018-03-03 | Discharge: 2018-03-03 | Disposition: A | Discharge status: Home / Self Care | Payer: Self-pay | Attending: Emergency Medicine | Admitting: Emergency Medicine

## 2018-03-03 ENCOUNTER — Encounter (HOSPITAL_COMMUNITY): Payer: Self-pay | Admitting: Emergency Medicine

## 2018-03-03 DIAGNOSIS — H5789 Other specified disorders of eye and adnexa: Secondary | ICD-10-CM

## 2018-03-03 DIAGNOSIS — Z7982 Long term (current) use of aspirin: Secondary | ICD-10-CM | POA: Insufficient documentation

## 2018-03-03 DIAGNOSIS — H5712 Ocular pain, left eye: Secondary | ICD-10-CM | POA: Insufficient documentation

## 2018-03-03 DIAGNOSIS — Z79899 Other long term (current) drug therapy: Secondary | ICD-10-CM | POA: Insufficient documentation

## 2018-03-03 DIAGNOSIS — F17210 Nicotine dependence, cigarettes, uncomplicated: Secondary | ICD-10-CM | POA: Insufficient documentation

## 2018-03-03 NOTE — ED Triage Notes (Signed)
Patient here from home states that [facility]. Reports left eye pain, redness, and drainage. Diagnosed with conjunctivitis. Cannot see specialist, does not have insurance. Given erythromycin without relief.

## 2018-03-03 NOTE — Discharge Instructions (Addendum)
[facility] with [facility] to establish care with a primary care doctor. [facility] can connect you with a financial counselor   Call other eye doctor offices and request payment plans.   Return for fevers, worsening eye redness, swelling or eyelid or face

## 2018-03-03 NOTE — ED Provider Notes (Signed)
[facility] DEPT Provider Note   CSN: [id number] Arrival date & time: 03/03/18  1401     History   Chief Complaint Chief Complaint  Patient presents with  . Eye Drainage  . Eye Pain  . Conjunctivitis    HPI Sheri Beck is a 52 y.o. female here for evaluation of left eye irritation x 2 weeks. Associated with a bump in the inner corner of the eye, eye redness, eye drainage sometimes yellow and sometimes clear, eye itching.  Her vision is intermittently blurry but resolves with wiping and blinking. She denies any foreign body sensation, diplopia, headache, eye or facial swelling, trauma, contact lens use. She was at [facility] ER for this yesterday and was diagnosed with pterygium and pinguecula causing conjunctivitis. She has been compliant with erythromycin ointment. She was advised to call Dr Manuella Ghazi (ophtho) but states they will charge her $240 and she can't afford it.   HPI  Past Medical History:  Diagnosis Date  . Hypertension     There are no active problems to display for this patient.   History reviewed. No pertinent surgical history.   OB History   None      Home Medications    Prior to Admission medications   Medication Sig Start Date End Date Taking? Authorizing Provider  aspirin 325 MG EC tablet Take 325 mg by mouth every 6 (six) hours as needed for pain.    [provider]  doxycycline (VIBRAMYCIN) 100 MG capsule Take 1 capsule (100 mg total) by mouth 2 (two) times daily. One po bid x 7 days 01/06/17   Molpus, John, MD  fluticasone [facility]) 50 MCG/ACT nasal spray Place 2 sprays into the nose daily. Patient taking differently: Place 2 sprays into the nose 2 (two) times daily as needed for allergies (allergies).  06/11/13   Hyman Bible, PA-C  lidocaine (XYLOCAINE) 2 % solution Use as directed 15 mLs in the mouth or throat every 3 (three) hours as needed for mouth pain. 03/02/18   McDonald, Mia A, PA-C  lisinopril  (PRINIVIL,ZESTRIL) 10 MG tablet Take 1 tablet (10 mg total) by mouth daily. 03/02/18 04/01/18  McDonald, Mia A, PA-C  Phenyleph-CPM-DM-APAP (ALKA-SELTZER PLUS COLD & FLU PO) Take 1 packet by mouth daily as needed (cold symptoms).    [provider]  Phenylephrine-Pheniramine-DM [facility] COLD & COUGH PO) Take 30 mLs by mouth daily as needed (cold symptoms).    [provider]    Family History Family History  Problem Relation Age of Onset  . Cancer Mother     Social History Social History   Tobacco Use  . Smoking status: Current Some Day Smoker    Packs/day: 0.10    Types: Cigarettes  . Smokeless tobacco: Never Used  Substance Use Topics  . Alcohol use: Yes    Comment: occasional  . Drug use: Yes    Types: Cocaine    Comment: Pt states last use was 01/02/17.     Allergies   Patient has no known allergies.   Review of Systems Review of Systems  Eyes: Positive for discharge, redness and itching.  All other systems reviewed and are negative.    Physical Exam Updated Vital Signs BP (!) 151/111 (BP Location: Right Arm)   Pulse 79   Temp 99.6 F (37.6 C) (Oral)   Resp 16   SpO2 99%   Physical Exam  Constitutional: She is oriented to person, place, and time. She appears well-developed and well-nourished.  Non-toxic appearance.  HENT:  Head: Normocephalic.  Right Ear: External ear normal.  Left Ear: External ear normal.  Nose: Nose normal.  Eyes: EOM are normal. Left eye exhibits chemosis and discharge. Left conjunctiva is injected.  Right eye: Lids symmetrical without lag or palpable mass. Sclera white without prominent vessels. Conjunctiva pink. PERRL and EOMs intact bilaterally.   Left eye: conjunctivae at inner corner is injected with prominent vessels. Previously noted pterygium noted to inner corner of eye. Ointment on eye lashes. Scant clear watery drainage from left eye. PERRL and EOMs intact bilaterally.  Neck: Full passive range of motion  without pain.  Cardiovascular: Normal rate.  Pulmonary/Chest: Effort normal. No tachypnea. No respiratory distress.  Musculoskeletal: Normal range of motion.  Neurological: She is alert and oriented to person, place, and time.  Skin: Skin is warm and dry. Capillary refill takes less than 2 seconds.  Psychiatric: Her behavior is normal. Thought content normal.     ED Treatments / Results  Labs (all labs ordered are listed, but only abnormal results are displayed) Labs Reviewed - No data to display  EKG None  Radiology No results found.  Procedures Procedures (including critical care time)  Medications Ordered in ED Medications - No data to display   Initial Impression / Assessment and Plan / ED Course  I have reviewed the triage vital signs and the nursing notes.  Pertinent labs & imaging results that were available during my care of the patient were reviewed by me and considered in my medical decision making (see chart for details).     Exam most consistent with scleral irritation from pterygium. She has been compliant with topical abx. No new trauma, diplopia, fb sensation, facial or eye lid swelling, headache. Pt main concern is being able to afford ophtho follow up.  I spoke to [place] (CM) who has provided resources. I met with patient again and states she [facility] to [facility] are with PCP. I encouraged her to call around to other ophtho offices and schedule an appointment for evaluation and request payment plans. Discussed return precautions and she verbalized understanding. No signs of periorbital or orbital cellulitis. Doubt corneal abrasion, irritation like from pterygium since she has had no trauma, FB sensation.   Final Clinical Impressions(s) / ED Diagnoses   Final diagnoses:  Eye irritation    ED Discharge Orders    None       Arlean Hopping 03/03/18 1617    Tanna Furry, MD 03/09/18 1147

## 2018-03-03 NOTE — Care Management Note (Signed)
Case Management Note  CM was consulted for no ins and need for non-emergent eye specialty follow up.  CM and [place] discussed pt's needs.  Information placed on AVS for 3 clinics, pharmacy, and financial counseling.  Before CM was able to speak with pt, Margarette AsalGibbons, [place] advised pt left AMA.  No further CM needs noted at this time.

## 2018-03-03 NOTE — ED Notes (Signed)
Pt left before receiving discharge paperwork or discharge vital signs. PA aware.

## 2018-03-31 ENCOUNTER — Other Ambulatory Visit: Payer: Self-pay

## 2018-03-31 ENCOUNTER — Encounter (INDEPENDENT_AMBULATORY_CARE_PROVIDER_SITE_OTHER): Payer: Self-pay | Admitting: Physician Assistant

## 2018-03-31 ENCOUNTER — Ambulatory Visit (INDEPENDENT_AMBULATORY_CARE_PROVIDER_SITE_OTHER): Payer: Self-pay | Admitting: Physician Assistant

## 2018-03-31 VITALS — BP 167/105 | HR 67 | Temp 97.9°F | Ht 62.0 in | Wt 206.6 lb

## 2018-03-31 DIAGNOSIS — Z1159 Encounter for screening for other viral diseases: Secondary | ICD-10-CM

## 2018-03-31 DIAGNOSIS — I10 Essential (primary) hypertension: Secondary | ICD-10-CM

## 2018-03-31 DIAGNOSIS — Z114 Encounter for screening for human immunodeficiency virus [HIV]: Secondary | ICD-10-CM

## 2018-03-31 DIAGNOSIS — Z1231 Encounter for screening mammogram for malignant neoplasm of breast: Secondary | ICD-10-CM

## 2018-03-31 DIAGNOSIS — Z1239 Encounter for other screening for malignant neoplasm of breast: Secondary | ICD-10-CM

## 2018-03-31 DIAGNOSIS — Z23 Encounter for immunization: Secondary | ICD-10-CM

## 2018-03-31 DIAGNOSIS — Z131 Encounter for screening for diabetes mellitus: Secondary | ICD-10-CM

## 2018-03-31 LAB — POCT GLYCOSYLATED HEMOGLOBIN (HGB A1C): HEMOGLOBIN A1C: 5.8 % — AB (ref 4.0–5.6)

## 2018-03-31 MED ORDER — LISINOPRIL-HYDROCHLOROTHIAZIDE 20-12.5 MG PO TABS: 2.0000 | ORAL_TABLET | Freq: Every day | ORAL | 5 refills | Status: DC

## 2018-03-31 NOTE — Patient Instructions (Signed)
Td Vaccine (Tetanus and Diphtheria): What You Need to Know 1. Why get vaccinated? Tetanus  and diphtheria are very serious diseases. They are rare in the [place] today, but people who do become infected often have severe complications. Td vaccine is used to protect adolescents and adults from both of these diseases. Both tetanus and diphtheria are infections caused by bacteria. Diphtheria spreads from person to person through coughing or sneezing. Tetanus-causing bacteria enter the body through cuts, scratches, or wounds. TETANUS (lockjaw) causes painful muscle tightening and stiffness, usually all over the body.  It can lead to tightening of muscles in the head and neck so you can't open your mouth, swallow, or sometimes even breathe. Tetanus kills about 1 out of every 10 people who are infected even after receiving the best medical care.  DIPHTHERIA can cause a thick coating to form in the back of the throat.  It can lead to breathing problems, paralysis, heart failure, and death.  Before vaccines, as many as 200,000 cases of diphtheria and hundreds of cases of tetanus were reported in the [place] each year. Since vaccination began, reports of cases for both diseases have dropped by about 99%. 2. Td vaccine Td vaccine can protect adolescents and adults from tetanus and diphtheria. Td is usually given as a booster dose every 10 years but it can also be given earlier after a severe and dirty wound or burn. Another vaccine, called Tdap, which protects against pertussis in addition to tetanus and diphtheria, is sometimes recommended instead of Td vaccine. Your doctor or the person giving you the vaccine can give you more information. Td may safely be given at the same time as other vaccines. 3. Some people should not get this vaccine  A person who has ever had a life-threatening allergic reaction after a previous dose of any tetanus or diphtheria containing vaccine, OR has a severe  allergy to any part of this vaccine, should not get Td vaccine. Tell the person giving the vaccine about any severe allergies.  Talk to your doctor if you: ? had severe pain or swelling after any vaccine containing diphtheria or tetanus, ? ever had a condition called Guillain Barre Syndrome (GBS), ? aren't feeling well on the day the shot is scheduled. 4. What are the risks from Td vaccine? With any medicine, including vaccines, there is a chance of side effects. These are usually mild and go away on their own. Serious reactions are also possible but are rare. Most people who get Td vaccine do not have any problems with it. Mild problems following Td vaccine: (Did not interfere with activities)  Pain where the shot was given (about 8 people in 10)  Redness or swelling where the shot was given (about 1 person in 4)  Mild fever (rare)  Headache (about 1 person in 4)  Tiredness (about 1 person in 4)  Moderate problems following Td vaccine: (Interfered with activities, but did not require medical attention)  Fever over 102F (rare)  Severe problems following Td vaccine: (Unable to perform usual activities; required medical attention)  Swelling, severe pain, bleeding and/or redness in the arm where the shot was given (rare).  Problems that could happen after any vaccine:  People sometimes faint after a medical procedure, including vaccination. Sitting or lying down for about 15 minutes can help prevent fainting, and injuries caused by a fall. Tell your doctor if you feel dizzy, or have vision changes or ringing in the ears.  Some people get   severe pain in the shoulder and have difficulty moving the arm where a shot was given. This happens very rarely.  Any medication can cause a severe allergic reaction. Such reactions from a vaccine are very rare, estimated at fewer than 1 in a million doses, and would happen within a few minutes to a few hours after the vaccination. As with any  medicine, there is a very remote chance of a vaccine causing a serious injury or death. The safety of vaccines is always being monitored. For more information, visit: www.cdc.gov/vaccinesafety/ 5. What if there is a serious reaction? What should I look for? Look for anything that concerns you, such as signs of a severe allergic reaction, very high fever, or unusual behavior. Signs of a severe allergic reaction can include hives, swelling of the face and throat, difficulty breathing, a fast heartbeat, dizziness, and weakness. These would usually start a few minutes to a few hours after the vaccination. What should I do?  If you think it is a severe allergic reaction or other emergency that can't wait, call 9-1-1 or get the person to the nearest hospital. Otherwise, call your doctor.  Afterward, the reaction should be reported to the Vaccine Adverse Event Reporting System (VAERS). Your doctor might file this report, or you can do it yourself through the VAERS web site at www.vaers.hhs.gov, or by calling 1-800-822-7967. ? VAERS does not give medical advice. 6. The National Vaccine Injury Compensation Program The National Vaccine Injury Compensation Program (VICP) is a federal program that was created to compensate people who may have been injured by certain vaccines. Persons who believe they may have been injured by a vaccine can learn about the program and about filing a claim by calling 1-800-338-2382 or visiting the VICP website at www.hrsa.gov/vaccinecompensation. There is a time limit to file a claim for compensation. 7. How can I learn more?  Ask your doctor. He or she can give you the vaccine package insert or suggest other sources of information.  Call your local or state health department.  Contact the Centers for Disease Control and Prevention (CDC): ? Call 1-800-232-4636 (1-800-CDC-INFO) ? Visit CDC's website at www.cdc.gov/vaccines CDC Td Vaccine VIS (01/13/16) This information is  not intended to replace advice given to you by your health care provider. Make sure you discuss any questions you have with your health care provider. Document Released: 07/18/2006 Document Revised: 06/10/2016 Document Reviewed: 06/10/2016 Elsevier Interactive Patient Education  2017 Elsevier Inc.    Hypertension Hypertension is another name for high blood pressure. High blood pressure forces your heart to work harder to pump blood. This can cause problems over time. There are two numbers in a blood pressure reading. There is a top number (systolic) over a bottom number (diastolic). It is best to have a blood pressure below 120/80. Healthy choices can help lower your blood pressure. You may need medicine to help lower your blood pressure if:  Your blood pressure cannot be lowered with healthy choices.  Your blood pressure is higher than 130/80.  Follow these instructions at home: Eating and drinking  If directed, follow the DASH eating plan. This diet includes: ? Filling half of your plate at each meal with fruits and vegetables. ? Filling one quarter of your plate at each meal with whole grains. Whole grains include whole wheat pasta, brown rice, and whole grain bread. ? Eating or drinking low-fat dairy products, such as skim milk or low-fat yogurt. ? Filling one quarter of your plate at each   meal with low-fat (lean) proteins. Low-fat proteins include fish, skinless chicken, eggs, beans, and tofu. ? Avoiding fatty meat, cured and processed meat, or chicken with skin. ? Avoiding premade or processed food.  Eat less than 1,500 mg of salt (sodium) a day.  Limit alcohol use to no more than 1 drink a day for nonpregnant women and 2 drinks a day for men. One drink equals 12 oz of beer, 5 oz of wine, or 1 oz of hard liquor. Lifestyle  Work with your doctor to stay at a healthy weight or to lose weight. Ask your doctor what the best weight is for you.  Get at least 30 minutes of exercise  that causes your heart to beat faster (aerobic exercise) most days of the week. This may include walking, swimming, or biking.  Get at least 30 minutes of exercise that strengthens your muscles (resistance exercise) at least 3 days a week. This may include lifting weights or pilates.  Do not use any products that contain nicotine or tobacco. This includes cigarettes and e-cigarettes. If you need help quitting, ask your doctor.  Check your blood pressure at home as told by your doctor.  Keep all follow-up visits as told by your doctor. This is important. Medicines  Take over-the-counter and prescription medicines only as told by your doctor. Follow directions carefully.  Do not skip doses of blood pressure medicine. The medicine does not work as well if you skip doses. Skipping doses also puts you at risk for problems.  Ask your doctor about side effects or reactions to medicines that you should watch for. Contact a doctor if:  You think you are having a reaction to the medicine you are taking.  You have headaches that keep coming back (recurring).  You feel dizzy.  You have swelling in your ankles.  You have trouble with your vision. Get help right away if:  You get a very bad headache.  You start to feel confused.  You feel weak or numb.  You feel faint.  You get very bad pain in your: ? Chest. ? Belly (abdomen).  You throw up (vomit) more than once.  You have trouble breathing. Summary  Hypertension is another name for high blood pressure.  Making healthy choices can help lower blood pressure. If your blood pressure cannot be controlled with healthy choices, you may need to take medicine. This information is not intended to replace advice given to you by your health care provider. Make sure you discuss any questions you have with your health care provider. Document Released: 03/08/2008 Document Revised: 08/18/2016 Document Reviewed: 08/18/2016 Elsevier Interactive  Patient Education  2018 Elsevier Inc.  

## 2018-03-31 NOTE — Progress Notes (Signed)
Subjective:  Patient ID: Sheri Beck, female    DOB: 10/20/1965  Age: 52 y.o. MRN: [id number]  CC: HTN  HPI Sheri Beck is a 52 y.o. female with a medical history of HTN, chronic dental pain, pterygium presents for a general check up. Has not been to a primary care provider in decades. She is concerned about her blood pressure which has been noted to be elevated during her emergency room visits. Endorses headaches and 'floating lights" on occasion. Does not endorse CP, palpitations, PND, orthopnea, presyncope, syncope, SOB, abdominal pian, tingling, numbness, weakness, paralysis, f/c/n/v, rash, swelling, or GI/GU sxs.       Outpatient Medications Prior to Visit  Medication Sig Dispense Refill  . aspirin 325 MG EC tablet Take 325 mg by mouth every 6 (six) hours as needed for pain.    Marland Kitchen. lisinopril (PRINIVIL,ZESTRIL) 10 MG tablet Take 1 tablet (10 mg total) by mouth daily. 30 tablet 2  . doxycycline (VIBRAMYCIN) 100 MG capsule Take 1 capsule (100 mg total) by mouth 2 (two) times daily. One po bid x 7 days 14 capsule 0  . fluticasone (FLONASE) 50 MCG/ACT nasal spray Place 2 sprays into the nose daily. (Patient taking differently: Place 2 sprays into the nose 2 (two) times daily as needed for allergies (allergies). ) 16 g 0  . lidocaine (XYLOCAINE) 2 % solution Use as directed 15 mLs in the mouth or throat every 3 (three) hours as needed for mouth pain. 100 mL 0  . Phenyleph-CPM-DM-APAP (ALKA-SELTZER PLUS COLD & FLU PO) Take 1 packet by mouth daily as needed (cold symptoms).    . Phenylephrine-Pheniramine-DM (THERAFLU COLD & COUGH PO) Take 30 mLs by mouth daily as needed (cold symptoms).     No facility-administered medications prior to visit.      ROS Review of Systems  Constitutional: Negative for chills, fever and malaise/fatigue.  Eyes: Negative for blurred vision.  Respiratory: Negative for shortness of breath.   Cardiovascular: Negative for chest pain and palpitations.   Gastrointestinal: Negative for abdominal pain and nausea.  Genitourinary: Negative for dysuria and hematuria.  Musculoskeletal: Negative for joint pain and myalgias.  Skin: Negative for rash.  Neurological: Positive for headaches. Negative for tingling.  Psychiatric/Behavioral: Negative for depression. The patient is not nervous/anxious.     Objective:  BP (!) 167/105 (BP Location: Left Arm, Patient Position: Sitting, Cuff Size: Large)   Pulse 67   Temp 97.9 F (36.6 C) (Oral)   Ht 5\' 2"  (1.575 m)   Wt 206 lb 9.6 oz (93.7 kg)   SpO2 100%   BMI 37.79 kg/m   BP/Weight 03/31/2018 03/03/2018 03/02/2018  Systolic BP 167 151 186  Diastolic BP 105 111 118  Wt. (Lbs) 206.6 - -  BMI 37.79 - -      Physical Exam  Constitutional: She is oriented to person, place, and time.  Well developed, obese, NAD, polite  HENT:  Head: Normocephalic and atraumatic.  Eyes: No scleral icterus.  Neck: Normal range of motion. Neck supple. No thyromegaly present.  Cardiovascular: Normal rate, regular rhythm, normal heart sounds and intact distal pulses. Exam reveals no gallop and no friction rub.  No murmur heard. Trace pitting edema of the lower extremity bilaterally. No carotid bruits bilaterally  Pulmonary/Chest: Effort normal and breath sounds normal.  Abdominal: Soft. Bowel sounds are normal. There is no tenderness.  Musculoskeletal: She exhibits no edema.  Neurological: She is alert and oriented to person, place, and time.  Skin:  Skin is warm and dry. No rash noted. No erythema. No pallor.  Psychiatric: She has a normal mood and affect. Her behavior is normal. Thought content normal.  Vitals reviewed.    Assessment & Plan:   1. Hypertension, unspecified type - CBC with Differential - Comprehensive metabolic panel - TSH - Lipid panel - Begin lisinopril-hydrochlorothiazide (ZESTORETIC) 20-12.5 MG tablet; Take 2 tablets by mouth daily.  Dispense: 60 tablet; Refill: 5 - Stop Lisinopril 10  mg.  2. Screening for HIV (human immunodeficiency virus) - HIV antibody  3. Need for hepatitis C screening test - Hepatitis c antibody (reflex)  4. Screening for diabetes mellitus - HgB A1c  5. Need for Tdap vaccination - Tdap vaccine greater than or equal to 7yo IM  6. Screening for breast cancer - [place] DIGITAL SCREENING BILATERAL; Future   Meds ordered this encounter  Medications  . lisinopril-hydrochlorothiazide (ZESTORETIC) 20-12.5 MG tablet    Sig: Take 2 tablets by mouth daily.    Dispense:  60 tablet    Refill:  5    Order Specific Question:   Supervising Provider    Answer:   Hoy Register [[id number]]    Follow-up: Return in about 1 month (around 04/28/2018) for HTN.   Loletta Specter PA

## 2018-04-01 LAB — LIPID PANEL
Chol/HDL Ratio: 4 ratio (ref 0.0–4.4)
Cholesterol, Total: 207 mg/dL — ABNORMAL HIGH (ref 100–199)
HDL: 52 mg/dL (ref >39)
LDL Calculated: 123 mg/dL — ABNORMAL HIGH (ref 0–99)
Triglycerides: 160 mg/dL — ABNORMAL HIGH (ref 0–149)
VLDL Cholesterol Cal: 32 mg/dL (ref 5–40)

## 2018-04-01 LAB — COMPREHENSIVE METABOLIC PANEL
A/G RATIO: 1.5 (ref 1.2–2.2)
ALT: 21 IU/L (ref 0–32)
AST: 16 IU/L (ref 0–40)
Albumin: 4.3 g/dL (ref 3.5–5.5)
Alkaline Phosphatase: 67 IU/L (ref 39–117)
BUN/Creatinine Ratio: 16 (ref 9–23)
BUN: 14 mg/dL (ref 6–24)
Bilirubin Total: 0.4 mg/dL (ref 0.0–1.2)
CALCIUM: 9.1 mg/dL (ref 8.7–10.2)
CHLORIDE: 105 mmol/L (ref 96–106)
CO2: 22 mmol/L (ref 20–29)
Creatinine, Ser: 0.85 mg/dL (ref 0.57–1.00)
GFR calc Af Amer: 90 mL/min/{1.73_m2} (ref >59)
GFR, EST NON AFRICAN AMERICAN: 78 mL/min/{1.73_m2} (ref >59)
Globulin, Total: 2.8 g/dL (ref 1.5–4.5)
Glucose: 100 mg/dL — ABNORMAL HIGH (ref 65–99)
POTASSIUM: 4.2 mmol/L (ref 3.5–5.2)
Sodium: 142 mmol/L (ref 134–144)
Total Protein: 7.1 g/dL (ref 6.0–8.5)

## 2018-04-01 LAB — CBC WITH DIFFERENTIAL/PLATELET
BASOS ABS: 0.1 10*3/uL (ref 0.0–0.2)
BASOS: 1 %
EOS (ABSOLUTE): 0.1 10*3/uL (ref 0.0–0.4)
EOS: 2 %
Hematocrit: 43.6 % (ref 34.0–46.6)
Hemoglobin: 14.3 g/dL (ref 11.1–15.9)
IMMATURE GRANULOCYTES: 0 %
Immature Grans (Abs): 0 10*3/uL (ref 0.0–0.1)
LYMPHS ABS: 3 10*3/uL (ref 0.7–3.1)
Lymphs: 52 %
MCH: 28.8 pg (ref 26.6–33.0)
MCHC: 32.8 g/dL (ref 31.5–35.7)
MCV: 88 fL (ref 79–97)
MONOS ABS: 0.4 10*3/uL (ref 0.1–0.9)
Monocytes: 8 %
Neutrophils Absolute: 2.1 10*3/uL (ref 1.4–7.0)
Neutrophils: 37 %
PLATELETS: 209 10*3/uL (ref 150–450)
RBC: 4.96 x10E6/uL (ref 3.77–5.28)
RDW: 13.3 % (ref 12.3–15.4)
WBC: 5.8 10*3/uL (ref 3.4–10.8)

## 2018-04-01 LAB — HEPATITIS C ANTIBODY (REFLEX): HCV Ab: <0.1 s/co ratio (ref 0.0–0.9)

## 2018-04-01 LAB — HIV ANTIBODY (ROUTINE TESTING W REFLEX): HIV SCREEN 4TH GENERATION: NONREACTIVE

## 2018-04-01 LAB — TSH: TSH: 2.62 u[IU]/mL (ref 0.450–4.500)

## 2018-04-01 LAB — HCV COMMENT:

## 2018-04-03 ENCOUNTER — Other Ambulatory Visit (INDEPENDENT_AMBULATORY_CARE_PROVIDER_SITE_OTHER): Payer: Self-pay | Admitting: Physician Assistant

## 2018-04-03 DIAGNOSIS — E7841 Elevated Lipoprotein(a): Secondary | ICD-10-CM

## 2018-04-03 MED ORDER — ATORVASTATIN CALCIUM 40 MG PO TABS: 40.0000 mg | ORAL_TABLET | Freq: Every day | ORAL | 3 refills | Status: DC

## 2018-04-05 ENCOUNTER — Telehealth (INDEPENDENT_AMBULATORY_CARE_PROVIDER_SITE_OTHER): Payer: Self-pay | Admitting: Physician Assistant

## 2018-04-05 NOTE — Telephone Encounter (Signed)
Patient states she can not afford her medication at [employer]24 dollar. Patient states she had gotten one medication for $14 and she can not afford that at the moment. She would like to know if there is anything or any where else she could get it cheaper.   Please Advice [phone]  Thank you

## 2018-04-07 ENCOUNTER — Telehealth (INDEPENDENT_AMBULATORY_CARE_PROVIDER_SITE_OTHER): Payer: Self-pay

## 2018-04-07 NOTE — Telephone Encounter (Signed)
Patient did purchase lisinopril-HCTZ it was $14, the atorvastatin was $24. Offered patient to use [facility] but she declined because it is too far away from her home. Maryjean Mornempestt S Torrez Renfroe, CMA

## 2018-04-07 NOTE — Telephone Encounter (Signed)
Patient aware. Sheri Beck S Hartleigh Edmonston, CMA  

## 2018-04-07 NOTE — Telephone Encounter (Signed)
Is this about lisinopril-HCTZ? I am not sure if this can be cheaper than what she stated. Did she try to work with [facility] for a lower price?

## 2018-04-07 NOTE — Telephone Encounter (Signed)
-----   Message from Loletta Specteroger David Gomez, PA-C sent at 04/03/2018  8:40 AM EDT ----- Labs negative/normal except for elevated cholesterol. I have [facility].

## 2018-04-07 NOTE — Telephone Encounter (Signed)
FWD to PCP. Otilia Kareem S Tarri Guilfoil, CMA  

## 2018-04-10 NOTE — Telephone Encounter (Signed)
Noted  

## 2018-04-28 ENCOUNTER — Ambulatory Visit (INDEPENDENT_AMBULATORY_CARE_PROVIDER_SITE_OTHER): Payer: Self-pay | Admitting: Physician Assistant

## 2018-05-01 ENCOUNTER — Ambulatory Visit (INDEPENDENT_AMBULATORY_CARE_PROVIDER_SITE_OTHER): Payer: Self-pay | Admitting: Physician Assistant

## 2018-05-25 ENCOUNTER — Ambulatory Visit (INDEPENDENT_AMBULATORY_CARE_PROVIDER_SITE_OTHER): Payer: Self-pay | Admitting: Physician Assistant

## 2018-06-30 ENCOUNTER — Ambulatory Visit (INDEPENDENT_AMBULATORY_CARE_PROVIDER_SITE_OTHER): Payer: Self-pay | Admitting: Physician Assistant

## 2018-08-24 ENCOUNTER — Ambulatory Visit (INDEPENDENT_AMBULATORY_CARE_PROVIDER_SITE_OTHER): Payer: Self-pay | Admitting: Physician Assistant

## 2019-08-15 ENCOUNTER — Ambulatory Visit (INDEPENDENT_AMBULATORY_CARE_PROVIDER_SITE_OTHER): Payer: Self-pay | Admitting: Primary Care

## 2019-08-16 ENCOUNTER — Other Ambulatory Visit: Payer: Self-pay

## 2019-08-16 ENCOUNTER — Encounter (INDEPENDENT_AMBULATORY_CARE_PROVIDER_SITE_OTHER): Payer: Self-pay | Admitting: Primary Care

## 2019-08-16 ENCOUNTER — Ambulatory Visit (INDEPENDENT_AMBULATORY_CARE_PROVIDER_SITE_OTHER): Payer: Self-pay | Admitting: Primary Care

## 2019-08-16 VITALS — BP 134/88 | HR 80 | Temp 97.9°F | Ht 62.0 in | Wt 204.2 lb

## 2019-08-16 DIAGNOSIS — M545 Low back pain, unspecified: Secondary | ICD-10-CM

## 2019-08-16 DIAGNOSIS — Z1211 Encounter for screening for malignant neoplasm of colon: Secondary | ICD-10-CM

## 2019-08-16 DIAGNOSIS — F5101 Primary insomnia: Secondary | ICD-10-CM

## 2019-08-16 DIAGNOSIS — Z124 Encounter for screening for malignant neoplasm of cervix: Secondary | ICD-10-CM

## 2019-08-16 DIAGNOSIS — N951 Menopausal and female climacteric states: Secondary | ICD-10-CM

## 2019-08-16 DIAGNOSIS — Z23 Encounter for immunization: Secondary | ICD-10-CM

## 2019-08-16 DIAGNOSIS — I10 Essential (primary) hypertension: Secondary | ICD-10-CM

## 2019-08-16 DIAGNOSIS — F17200 Nicotine dependence, unspecified, uncomplicated: Secondary | ICD-10-CM

## 2019-08-16 DIAGNOSIS — Z1231 Encounter for screening mammogram for malignant neoplasm of breast: Secondary | ICD-10-CM

## 2019-08-16 DIAGNOSIS — H6123 Impacted cerumen, bilateral: Secondary | ICD-10-CM

## 2019-08-16 DIAGNOSIS — H9193 Unspecified hearing loss, bilateral: Secondary | ICD-10-CM

## 2019-08-16 DIAGNOSIS — E785 Hyperlipidemia, unspecified: Secondary | ICD-10-CM

## 2019-08-16 MED ORDER — IBUPROFEN 800 MG PO TABS: 600.0000 mg | ORAL_TABLET | Freq: Three times a day (TID) | ORAL | 1 refills | Status: DC | PRN

## 2019-08-16 MED ORDER — LISINOPRIL-HYDROCHLOROTHIAZIDE 20-12.5 MG PO TABS: 1.0000 | ORAL_TABLET | Freq: Every day | ORAL | 3 refills | Status: DC

## 2019-08-16 NOTE — Progress Notes (Signed)
800 mg  Established Patient Office Visit  Subjective:  Patient ID: Sheri Beck, female    DOB: 09/18/1966  Age: 53 y.o. MRN: [id number]  CC:  Chief Complaint  Patient presents with  . New Patient (Initial Visit)  . Cerumen Impaction    HPI Kmari D Beck presents for establishment of care with new provider but established at [facility]. She is here today for the management of her blood pressure She denies shortness of breath, headaches, chest pain or lower extremity edema. She does complain of back and ear  pain and sleeping sometimes.  Past Medical History:  Diagnosis Date  . Hypertension     History reviewed. No pertinent surgical history.  Family History  Problem Relation Age of Onset  . Cancer Mother     Social History   Socioeconomic History  . Marital status: Single    Spouse name: Not on file  . Number of children: Not on file  . Years of education: Not on file  . Highest education level: Not on file  Occupational History  . Not on file  Social Needs  . Financial resource strain: Not on file  . Food insecurity    Worry: Not on file    Inability: Not on file  . Transportation needs    Medical: Not on file    Non-medical: Not on file  Tobacco Use  . Smoking status: Current Some Day Smoker    Packs/day: 0.10    Types: Cigarettes  . Smokeless tobacco: Never Used  Substance and Sexual Activity  . Alcohol use: Yes    Comment: occasional  . Drug use: Yes    Types: Cocaine    Comment: Pt states last use was 01/02/17.  . Sexual activity: Yes    Birth control/protection: None  Lifestyle  . Physical activity    Days per week: Not on file    Minutes per session: Not on file  . Stress: Not on file  Relationships  . Social connections    Talks on phone: Not on file    Gets together: Not on file    Attends religious service: Not on file    Active member of club or organization: Not on file    Attends meetings of clubs or organizations: Not on file    Relationship  status: Not on file  . Intimate partner violence    Fear of current or ex partner: Not on file    Emotionally abused: Not on file    Physically abused: Not on file    Forced sexual activity: Not on file  Other Topics Concern  . Not on file  Social History Narrative  . Not on file    Outpatient Medications Prior to Visit  Medication Sig Dispense Refill  . aspirin 325 MG EC tablet Take 325 mg by mouth every 6 (six) hours as needed for pain.    . atorvastatin (LIPITOR) 40 MG tablet Take 1 tablet (40 mg total) by mouth daily. (Patient not taking: Reported on 08/16/2019) 90 tablet 3  . lisinopril-hydrochlorothiazide (ZESTORETIC) 20-12.5 MG tablet Take 2 tablets by mouth daily. (Patient not taking: Reported on 08/16/2019) 60 tablet 5   No facility-administered medications prior to visit.     No Known Allergies  ROS Review of Systems  HENT: Positive for hearing loss.   Endocrine: Positive for heat intolerance.       Menopause   Genitourinary:       Menopausal   Psychiatric/Behavioral: Positive for   sleep disturbance.  All other systems reviewed and are negative.     Objective:    Physical Exam  Constitutional: She is oriented to person, place, and time. She appears well-developed and well-nourished.  HENT:  Head: Normocephalic.  Cerumen impaction  Eyes: Pupils are equal, round, and reactive to light. EOM are normal.  Neck: Neck supple.  Cardiovascular: Normal rate and regular rhythm.  Pulmonary/Chest: Effort normal and breath sounds normal.  Abdominal: Soft. Bowel sounds are normal. She exhibits distension.  Musculoskeletal: Normal range of motion.  Neurological: She is oriented to person, place, and time.  Skin:  sweating  Psychiatric: She has a normal mood and affect. Her behavior is normal. Judgment and thought content normal.    BP 134/88 (BP Location: Left Arm, Patient Position: Sitting, Cuff Size: Large)   Pulse 80   Temp 97.9 F (36.6 C) (Oral)   Ht 5' 2"  (1.575 m)   Wt 204 lb 3.2 oz (92.6 kg)   SpO2 97%   BMI 37.35 kg/m  Wt Readings from Last 3 Encounters:  08/16/19 204 lb 3.2 oz (92.6 kg)  03/31/18 206 lb 9.6 oz (93.7 kg)  01/05/17 183 lb (83 kg)     Health Maintenance Due  Topic Date Due  . PAP SMEAR-Modifier  11/12/1984  . MAMMOGRAM  11/12/2013  . Fecal DNA (Cologuard)  11/12/2013    There are no preventive care reminders to display for this patient.  Lab Results  Component Value Date   TSH 2.620 03/31/2018   Lab Results  Component Value Date   WBC 5.8 03/31/2018   HGB 14.3 03/31/2018   HCT 43.6 03/31/2018   MCV 88 03/31/2018   PLT 209 03/31/2018   Lab Results  Component Value Date   NA 142 03/31/2018   K 4.2 03/31/2018   CO2 22 03/31/2018   GLUCOSE 100 (H) 03/31/2018   BUN 14 03/31/2018   CREATININE 0.85 03/31/2018   BILITOT 0.4 03/31/2018   ALKPHOS 67 03/31/2018   AST 16 03/31/2018   ALT 21 03/31/2018   PROT 7.1 03/31/2018   ALBUMIN 4.3 03/31/2018   CALCIUM 9.1 03/31/2018   ANIONGAP 7 01/05/2017   Lab Results  Component Value Date   CHOL 207 (H) 03/31/2018   Lab Results  Component Value Date   HDL 52 03/31/2018   Lab Results  Component Value Date   LDLCALC 123 (H) 03/31/2018   Lab Results  Component Value Date   TRIG 160 (H) 03/31/2018    Lab Results  Component Value Date   HGBA1C 5.8 (A) 03/31/2018   Lindsay was seen today for new patient (initial visit) and cerumen impaction.  Diagnoses and all orders for this visit:  Hypertension, unspecified type -     Lipid panel; Future -     CMP14+EGFR; Future -     CBC with Differential future; Future -     lisinopril-hydrochlorothiazide (ZESTORETIC) 20-12.5 MG tablet; Take 1 tablet by mouth daily.  Menopausal hot flushes  Tobacco use disorder  Primary insomnia  Low back pain without sciatica, unspecified back pain laterality, unspecified chronicity She may use heat or ice for chronic back pain treatment recommended NSAIDs prescribed  ibuprofen 800 mg every 8 hours as needed for pain prior to taking medication in the first.  Hyperlipidemia, unspecified hyperlipidemia type Blood pressure reading today 134/88 will continue with lisinopril 20 and HCTZ 12.5.  Encourage monitoring sodium intake, exercising vigorously 30 minutes daily or 150 weekly weight reduction can also improve   blood pressure. -     lisinopril-hydrochlorothiazide (ZESTORETIC) 20-12.5 MG tablet; Take 1 tablet by mouth daily.  Colon cancer screening -     Fecal occult blood, imunochemical([facility]); Future  Breast cancer screening by mammogram Patient completed application for [facility] while in clinic and application has and faxed to [facility]. Patient aware that [facility] will contact her directly to schedule appointment.  Cervical cancer screening Patient completed application for [facility] while in clinic and application has and faxed to [facility]. Patient aware that [facility] will contact her directly to schedule appointment.  Need for immunization against influenza -     Flu Vaccine QUAD 36+ mos IM  Other orders -     ibuprofen (ADVIL) 800 MG tablet; Take 1 tablet (800 mg total) by mouth every 8 (eight) hours as needed.     Meds ordered this encounter  Medications  . lisinopril-hydrochlorothiazide (ZESTORETIC) 20-12.5 MG tablet    Sig: Take 1 tablet by mouth daily.    Dispense:  30 tablet    Refill:  3  . ibuprofen (ADVIL) 800 MG tablet    Sig: Take 1 tablet (800 mg total) by mouth every 8 (eight) hours as needed.    Dispense:  60 tablet    Refill:  1    Follow-up: Return in about 6 months (around 02/13/2020) for HTN.    Michelle P Edwards, NP 

## 2019-08-16 NOTE — Patient Instructions (Signed)
Health Maintenance, Female Adopting a healthy lifestyle and getting preventive care are important in promoting health and wellness. Ask your health care provider about:  The right schedule for you to have regular tests and exams.  Things you can do on your own to prevent diseases and keep yourself healthy. What should I know about diet, weight, and exercise? Eat a healthy diet   Eat a diet that includes plenty of vegetables, fruits, low-fat dairy products, and lean protein.  Do not eat a lot of foods that are high in solid fats, added sugars, or sodium. Maintain a healthy weight Body mass index (BMI) is used to identify weight problems. It estimates body fat based on height and weight. Your health care provider can help determine your BMI and help you achieve or maintain a healthy weight. Get regular exercise Get regular exercise. This is one of the most important things you can do for your health. Most adults should:  Exercise for at least 150 minutes each week. The exercise should increase your heart rate and make you sweat (moderate-intensity exercise).  Do strengthening exercises at least twice a week. This is in addition to the moderate-intensity exercise.  Spend less time sitting. Even light physical activity can be beneficial. Watch cholesterol and blood lipids Have your blood tested for lipids and cholesterol at 55 years of age, then have this test every 5 years. Have your cholesterol levels checked more often if:  Your lipid or cholesterol levels are high.  You are older than 55 years of age.  You are at high risk for heart disease. What should I know about cancer screening? Depending on your health history and family history, you may need to have cancer screening at various ages. This may include screening for:  Breast cancer.  Cervical cancer.  Colorectal cancer.  Skin cancer.  Lung cancer. What should I know about heart disease, diabetes, and high blood  pressure? Blood pressure and heart disease  High blood pressure causes heart disease and increases the risk of stroke. This is more likely to develop in people who have high blood pressure readings, are of African descent, or are overweight.  Have your blood pressure checked: ? Every 3-5 years if you are 18-39 years of age. ? Every year if you are 40 years old or older. Diabetes Have regular diabetes screenings. This checks your fasting blood sugar level. Have the screening done:  Once every three years after age 40 if you are at a normal weight and have a low risk for diabetes.  More often and at a younger age if you are overweight or have a high risk for diabetes. What should I know about preventing infection? Hepatitis B If you have a higher risk for hepatitis B, you should be screened for this virus. Talk with your health care provider to find out if you are at risk for hepatitis B infection. Hepatitis C Testing is recommended for:  Everyone born from 1945 through 1965.  Anyone with known risk factors for hepatitis C. Sexually transmitted infections (STIs)  Get screened for STIs, including gonorrhea and chlamydia, if: ? You are sexually active and are younger than 55 years of age. ? You are older than 55 years of age and your health care provider tells you that you are at risk for this type of infection. ? Your sexual activity has changed since you were last screened, and you are at increased risk for chlamydia or gonorrhea. Ask your health care provider if   you are at risk.  Ask your health care provider about whether you are at high risk for HIV. Your health care provider may recommend a prescription medicine to help prevent HIV infection. If you choose to take medicine to prevent HIV, you should first get tested for HIV. You should then be tested every 3 months for as long as you are taking the medicine. Pregnancy  If you are about to stop having your period (premenopausal) and  you may become pregnant, seek counseling before you get pregnant.  Take 400 to 800 micrograms (mcg) of folic acid every day if you become pregnant.  Ask for birth control (contraception) if you want to prevent pregnancy. Osteoporosis and menopause Osteoporosis is a disease in which the bones lose minerals and strength with aging. This can result in bone fractures. If you are 65 years old or older, or if you are at risk for osteoporosis and fractures, ask your health care provider if you should:  Be screened for bone loss.  Take a calcium or vitamin D supplement to lower your risk of fractures.  Be given hormone replacement therapy (HRT) to treat symptoms of menopause. Follow these instructions at home: Lifestyle  Do not use any products that contain nicotine or tobacco, such as cigarettes, e-cigarettes, and chewing tobacco. If you need help quitting, ask your health care provider.  Do not use street drugs.  Do not share needles.  Ask your health care provider for help if you need support or information about quitting drugs. Alcohol use  Do not drink alcohol if: ? Your health care provider tells you not to drink. ? You are pregnant, may be pregnant, or are planning to become pregnant.  If you drink alcohol: ? Limit how much you use to 0-1 drink a day. ? Limit intake if you are breastfeeding.  Be aware of how much alcohol is in your drink. In the [place] one drink equals one 12 oz bottle of beer (355 mL), one 5 oz glass of wine (148 mL), or one 1 oz glass of hard liquor (44 mL). General instructions  Schedule regular health, dental, and eye exams.  Stay current with your vaccines.  Tell your health care provider if: ? You often feel depressed. ? You have ever been abused or do not feel safe at home. Summary  Adopting a healthy lifestyle and getting preventive care are important in promoting health and wellness.  Follow your health care provider's instructions about healthy  diet, exercising, and getting tested or screened for diseases.  Follow your health care provider's instructions on monitoring your cholesterol and blood pressure. This information is not intended to replace advice given to you by your health care provider. Make sure you discuss any questions you have with your health care provider. Document Released: 04/05/2011 Document Revised: 09/13/2018 Document Reviewed: 09/13/2018 Elsevier Patient Education  2020 Elsevier Inc.  

## 2020-02-13 ENCOUNTER — Ambulatory Visit (INDEPENDENT_AMBULATORY_CARE_PROVIDER_SITE_OTHER): Payer: Self-pay | Admitting: Primary Care

## 2020-04-23 ENCOUNTER — Other Ambulatory Visit (INDEPENDENT_AMBULATORY_CARE_PROVIDER_SITE_OTHER): Payer: Self-pay | Admitting: Primary Care

## 2020-04-23 ENCOUNTER — Other Ambulatory Visit: Payer: Self-pay

## 2020-04-23 ENCOUNTER — Ambulatory Visit (INDEPENDENT_AMBULATORY_CARE_PROVIDER_SITE_OTHER): Payer: Self-pay | Admitting: Primary Care

## 2020-04-23 ENCOUNTER — Encounter (INDEPENDENT_AMBULATORY_CARE_PROVIDER_SITE_OTHER): Payer: Self-pay | Admitting: Primary Care

## 2020-04-23 VITALS — BP 169/113 | HR 62 | Temp 98.6°F | Ht 62.0 in | Wt 203.8 lb

## 2020-04-23 DIAGNOSIS — M25562 Pain in left knee: Secondary | ICD-10-CM

## 2020-04-23 DIAGNOSIS — E6609 Other obesity due to excess calories: Secondary | ICD-10-CM

## 2020-04-23 DIAGNOSIS — Z6837 Body mass index (BMI) 37.0-37.9, adult: Secondary | ICD-10-CM

## 2020-04-23 DIAGNOSIS — R7303 Prediabetes: Secondary | ICD-10-CM

## 2020-04-23 DIAGNOSIS — E785 Hyperlipidemia, unspecified: Secondary | ICD-10-CM

## 2020-04-23 DIAGNOSIS — Z1231 Encounter for screening mammogram for malignant neoplasm of breast: Secondary | ICD-10-CM

## 2020-04-23 DIAGNOSIS — Z1211 Encounter for screening for malignant neoplasm of colon: Secondary | ICD-10-CM

## 2020-04-23 DIAGNOSIS — F17200 Nicotine dependence, unspecified, uncomplicated: Secondary | ICD-10-CM

## 2020-04-23 DIAGNOSIS — I10 Essential (primary) hypertension: Secondary | ICD-10-CM

## 2020-04-23 DIAGNOSIS — E782 Mixed hyperlipidemia: Secondary | ICD-10-CM

## 2020-04-23 DIAGNOSIS — M25561 Pain in right knee: Secondary | ICD-10-CM

## 2020-04-23 DIAGNOSIS — G8929 Other chronic pain: Secondary | ICD-10-CM

## 2020-04-23 LAB — POCT GLYCOSYLATED HEMOGLOBIN (HGB A1C): Hemoglobin A1C: 5.9 % — AB (ref 4.0–5.6)

## 2020-04-23 LAB — GLUCOSE, POCT (MANUAL RESULT ENTRY): POC Glucose: 110 mg/dl — AB (ref 70–99)

## 2020-04-23 MED ORDER — LISINOPRIL-HYDROCHLOROTHIAZIDE 20-12.5 MG PO TABS: 1.0000 | ORAL_TABLET | Freq: Every day | ORAL | 1 refills | Status: DC

## 2020-04-23 MED ORDER — IBUPROFEN 800 MG PO TABS: 800.0000 mg | ORAL_TABLET | Freq: Three times a day (TID) | ORAL | 1 refills | Status: DC | PRN

## 2020-04-23 MED ORDER — CLONIDINE HCL 0.1 MG PO TABS
0.1000 mg | ORAL_TABLET | Freq: Once | ORAL | Status: AC
  Administered 2020-04-23: 0.1 mg via ORAL

## 2020-04-23 MED FILL — IBUPROFEN 800 MG TABLET: 800 | 30 days supply | Qty: 90 | Fill #0

## 2020-04-23 MED FILL — LISINOPRIL-HCTZ 20-12.5 MG: 20-12.5 | 30 days supply | Qty: 30 | Fill #0

## 2020-04-23 NOTE — Progress Notes (Signed)
New Patient Office Visit  Subjective:  Patient ID: Sheri Beck, female    DOB: Jan 07, 1966  Age: 54 y.o. MRN: [id number]  CC:  Chief Complaint  Patient presents with  . Hypertension    Pt. is here for blood pressure follow up.     HPI Sheri Beck presents for management of hypertension she has been out of medication for 1 month. Depressed not taking care of herself multiple deaths in the family, nephew committed suicide 40 years old he went home for lunch never return back to work. Denies of breath, headaches, chest pain or lower extremity edema. She also has dental problems needing filling and extractions. Fells like she has bilateral knee popping.   Past Medical History:  Diagnosis Date  . Hypertension     History reviewed. No pertinent surgical history.  Family History  Problem Relation Age of Onset  . Cancer Mother     Social History   Socioeconomic History  . Marital status: Single    Spouse name: Not on file  . Number of children: Not on file  . Years of education: Not on file  . Highest education level: Not on file  Occupational History  . Not on file  Tobacco Use  . Smoking status: Current Some Day Smoker    Packs/day: 0.10    Types: Cigarettes  . Smokeless tobacco: Never Used  Substance and Sexual Activity  . Alcohol use: Yes    Comment: occasional  . Drug use: Yes    Types: Cocaine    Comment: Pt states last use was 01/02/17.  Marland Kitchen Sexual activity: Yes    Birth control/protection: None  Other Topics Concern  . Not on file  Social History Narrative  . Not on file   Social Determinants of Health   Financial Resource Strain:   . Difficulty of Paying Living Expenses:   Food Insecurity:   . Worried About Programme researcher, broadcasting/film/video in the Last Year:   . Barista in the Last Year:   Transportation Needs:   . Freight forwarder (Medical):   Marland Kitchen Lack of Transportation (Non-Medical):   Physical Activity:   . Days of Exercise per Week:   .  Minutes of Exercise per Session:   Stress:   . Feeling of Stress :   Social Connections:   . Frequency of Communication with Friends and Family:   . Frequency of Social Gatherings with Friends and Family:   . Attends Religious Services:   . Active Member of Clubs or Organizations:   . Attends Banker Meetings:   Marland Kitchen Marital Status:   Intimate Partner Violence:   . Fear of Current or Ex-Partner:   . Emotionally Abused:   Marland Kitchen Physically Abused:   . Sexually Abused:     ROS Review of Systems  HENT: Positive for dental problem.   Musculoskeletal:       Bilateral knee pain   All other systems reviewed and are negative.   Objective:   Today's Vitals: BP (!) 169/113 (BP Location: Right Arm, Patient Position: Sitting, Cuff Size: Normal)   Pulse 62   Temp 98.6 F (37 C) (Oral)   Ht 5\' 2"  (1.575 m)   Wt 203 lb 12.8 oz (92.4 kg)   SpO2 97%   BMI 37.28 kg/m   Physical Exam Vitals reviewed.  Constitutional:      Appearance: She is obese.  HENT:     Head: Normocephalic.  Right Ear: Tympanic membrane normal.     Left Ear: Tympanic membrane normal.     Nose: Nose normal.  Eyes:     Extraocular Movements: Extraocular movements intact.  Cardiovascular:     Rate and Rhythm: Normal rate and regular rhythm.     Pulses: Normal pulses.     Heart sounds: Normal heart sounds.  Pulmonary:     Effort: Pulmonary effort is normal.     Breath sounds: Normal breath sounds.  Abdominal:     General: Bowel sounds are normal.  Musculoskeletal:        General: Normal range of motion.     Cervical back: Normal range of motion and neck supple.     Comments: Crepitus bilateral knee pain/joint   Skin:    General: Skin is warm and dry.  Neurological:     Mental Status: She is alert and oriented to person, place, and time.  Psychiatric:        Mood and Affect: Mood normal.        Behavior: Behavior normal.        Thought Content: Thought content normal.        Judgment:  Judgment normal.     Assessment & Plan:  Sheri Beck was seen today for hypertension.  Diagnoses and all orders for this visit:  Prediabetes -     Glucose (CBG) -     HgB A1c 5.9 Monitor foods that are high in carbohydrates are the following rice, potatoes, breads, sugars, and pastas.  Reduction in the intake (eating) will assist in lowering your blood sugars.  Hypertension, unspecified type Out of Bp medications . Counseled on blood pressure goal of less than 130/80, low-sodium, DASH diet, medication compliance, 150 minutes of moderate intensity exercise per week. Discussed medication compliance, adverse effects. -     cloNIDine (CATAPRES) tablet 0.1 mg  Tobacco use disorder She has decreased smoking to 1 day.She is aware of the   increased risk for lung cancer and other respiratory diseases recommend cessation.  This will be reminded at each clinical visit.  Breast cancer screening by mammogram Patient completed application for [facility] while in clinic and application has and faxed to [facility]. Patient aware that [facility] will contact her directly to schedule appointment.  Colon cancer screening Given FOBT to return for screening   Class 2 obesity due to excess calories with body mass index (BMI) of 37.0 to 37.9 in adult, unspecified whether serious comorbidity present Obesity is 30-39 indicating an excess in caloric intake or underlining conditions. This may lead to other co-morbidities. Pain in bilateral knees, respiratory complication. Lifestyle modifications of diet and exercise may reduce obesity.   Bilateral chronic knee pain Work on losing weight to help reduce joint pain. May alternate with heat and ice application for pain relief. May also alternate with acetaminophen and Ibuprofen as prescribed pain relief. Other alternatives include massage, acupuncture and water aerobics.  You must stay active and avoid a sedentary lifestyle.  Outpatient Encounter Medications as of 04/23/2020   Medication Sig  . aspirin 325 MG EC tablet Take 325 mg by mouth every 6 (six) hours as needed for pain.  Marland Kitchen ibuprofen (ADVIL) 800 MG tablet Take 1 tablet (800 mg total) by mouth every 8 (eight) hours as needed.  Marland Kitchen lisinopril-hydrochlorothiazide (ZESTORETIC) 20-12.5 MG tablet Take 1 tablet by mouth daily.  . [DISCONTINUED] lisinopril-hydrochlorothiazide (ZESTORETIC) 20-12.5 MG tablet Take 1 tablet by mouth daily.  Marland Kitchen atorvastatin (LIPITOR) 40 MG tablet Take 1 tablet (  40 mg total) by mouth daily. (Patient not taking: Reported on 08/16/2019)  . [EXPIRED] cloNIDine (CATAPRES) tablet 0.1 mg    No facility-administered encounter medications on file as of 04/23/2020.    Follow-up: Return in about 6 weeks (around 06/04/2020) for in person Bp check .   Grayce Sessions, NP

## 2020-04-23 NOTE — Addendum Note (Signed)
Addended byVidal Schwalbe on: 04/23/2020 02:32 PM   Modules accepted: Orders

## 2020-04-23 NOTE — Patient Instructions (Signed)

## 2020-04-24 ENCOUNTER — Other Ambulatory Visit (INDEPENDENT_AMBULATORY_CARE_PROVIDER_SITE_OTHER): Payer: Self-pay | Admitting: Primary Care

## 2020-04-24 DIAGNOSIS — E785 Hyperlipidemia, unspecified: Secondary | ICD-10-CM

## 2020-04-24 DIAGNOSIS — I10 Essential (primary) hypertension: Secondary | ICD-10-CM

## 2020-04-24 NOTE — Telephone Encounter (Signed)
Patient calling to request a refill on lisinopril-hydrochlorothiazide (ZESTORETIC) 20-12.5 MG tablet and ibuprofen (ADVIL) 800 MG tablet. Patient was intending to get the medication today but she contacted the pharmacy and it wasn't there.  [facility] [address] ([address] [place] - [address] Phone:  [phone]  Fax:  [phone]

## 2020-05-06 ENCOUNTER — Ambulatory Visit: Payer: Self-pay | Admitting: Critical Care Medicine

## 2020-05-06 ENCOUNTER — Other Ambulatory Visit: Payer: Self-pay | Admitting: Critical Care Medicine

## 2020-05-06 ENCOUNTER — Other Ambulatory Visit: Payer: Self-pay

## 2020-05-06 VITALS — BP 150/113 | HR 87 | Temp 98.8°F | Resp 18 | Ht 62.0 in | Wt 200.0 lb

## 2020-05-06 DIAGNOSIS — I10 Essential (primary) hypertension: Secondary | ICD-10-CM

## 2020-05-06 DIAGNOSIS — H1031 Unspecified acute conjunctivitis, right eye: Secondary | ICD-10-CM

## 2020-05-06 DIAGNOSIS — K047 Periapical abscess without sinus: Secondary | ICD-10-CM

## 2020-05-06 MED ORDER — AMLODIPINE BESYLATE 10 MG PO TABS: 10.0000 mg | ORAL_TABLET | Freq: Every day | ORAL | 3 refills | Status: DC

## 2020-05-06 MED ORDER — ERYTHROMYCIN 5 MG/GM OP OINT: 1.0000 "application " | TOPICAL_OINTMENT | Freq: Every day | OPHTHALMIC | 0 refills | Status: DC

## 2020-05-06 MED ORDER — TRAMADOL HCL 50 MG PO TABS: 50.0000 mg | ORAL_TABLET | Freq: Three times a day (TID) | ORAL | 0 refills | Status: AC | PRN

## 2020-05-06 MED ORDER — AMOXICILLIN-POT CLAVULANATE 875-125 MG PO TABS: 1.0000 | ORAL_TABLET | Freq: Two times a day (BID) | ORAL | 0 refills | Status: DC

## 2020-05-06 MED FILL — AMOX-CLAV 875-125 MG TABLET: 875-125 | 7 days supply | Qty: 14 | Fill #0

## 2020-05-06 MED FILL — ERYTHROMYCIN EYE OINTMENT: 5 | 30 days supply | Qty: 4 | Fill #0

## 2020-05-06 MED FILL — traMADol HCL 50 MG TABS: 50 | 5 days supply | Qty: 15 | Fill #0

## 2020-05-06 MED FILL — AMLODIPINE BESYLATE 10 MG T: 10 | 30 days supply | Qty: 30 | Fill #0

## 2020-05-06 NOTE — Progress Notes (Signed)
Subjective:    Patient ID: Sheri Beck, female    DOB: [birth year], 54 y.o.   MRN: [id number]  54 y.o.F whose primary care provider is Gwinda Passe is seen today on the mobile medicine unit for evaluation of tooth pain and R eye irritation.  Note on arrival blood pressure was 150/113 and the patient has history of hypertension is on lisinopril HCT 20/12.5 and takes this daily.  The patient's had progression over the last 2 weeks of tearing in the Right  eye and pain in the Right  eye with change in vision and blurriness.  .  She also complains of chronic right knee pain.  She stopped taking cholesterol medication in the past.  She does have hypertension has been compliant on this medication.  She also states an additional problem is that she chipped posterior molar upper jaw tooth on the right several weeks ago and is now sharp and damaging her tongue if she eats a certain way.  She has pain in the jaw itself as well.  The patient denies any fever at this time.   Note the patient did receive her Covid vaccine already which was a Radiographer, therapeutic vaccine    Past Medical History:  Diagnosis Date  . Hypertension      Family History  Problem Relation Age of Onset  . Cancer Mother      Social History   Socioeconomic History  . Marital status: Single    Spouse name: Not on file  . Number of children: Not on file  . Years of education: Not on file  . Highest education level: Not on file  Occupational History  . Not on file  Tobacco Use  . Smoking status: Current Some Day Smoker    Packs/day: 0.10    Types: Cigarettes  . Smokeless tobacco: Never Used  Substance and Sexual Activity  . Alcohol use: Yes    Comment: occasional  . Drug use: Yes    Types: Cocaine    Comment: Pt states last use was 01/02/17.  Marland Kitchen Sexual activity: Yes    Birth control/protection: None  Other Topics Concern  . Not on file  Social History Narrative  . Not on file   Social Determinants of Health    Financial Resource Strain:   . Difficulty of Paying Living Expenses:   Food Insecurity:   . Worried About Programme researcher, broadcasting/film/video in the Last Year:   . Barista in the Last Year:   Transportation Needs:   . Freight forwarder (Medical):   Marland Kitchen Lack of Transportation (Non-Medical):   Physical Activity:   . Days of Exercise per Week:   . Minutes of Exercise per Session:   Stress:   . Feeling of Stress :   Social Connections:   . Frequency of Communication with Friends and Family:   . Frequency of Social Gatherings with Friends and Family:   . Attends Religious Services:   . Active Member of Clubs or Organizations:   . Attends Banker Meetings:   Marland Kitchen Marital Status:   Intimate Partner Violence:   . Fear of Current or Ex-Partner:   . Emotionally Abused:   Marland Kitchen Physically Abused:   . Sexually Abused:      No Known Allergies   Outpatient Medications Prior to Visit  Medication Sig Dispense Refill  . aspirin 325 MG EC tablet Take 325 mg by mouth every 6 (six) hours as needed for pain.    Marland Kitchen  ibuprofen (ADVIL) 800 MG tablet Take 1 tablet (800 mg total) by mouth every 8 (eight) hours as needed. 90 tablet 1  . lisinopril-hydrochlorothiazide (ZESTORETIC) 20-12.5 MG tablet Take 1 tablet by mouth daily. 90 tablet 1  . atorvastatin (LIPITOR) 40 MG tablet Take 1 tablet (40 mg total) by mouth daily. (Patient not taking: Reported on 08/16/2019) 90 tablet 3   No facility-administered medications prior to visit.    Review of Systems  HENT: Positive for dental problem. Negative for congestion, ear discharge, ear pain, facial swelling, hearing loss, mouth sores, nosebleeds, postnasal drip, rhinorrhea, sinus pressure, sinus pain, sneezing, sore throat, trouble swallowing and voice change.   Eyes: Positive for pain, discharge, redness, itching and visual disturbance.  Respiratory: Negative.   Cardiovascular: Negative.   Gastrointestinal: Negative.   Genitourinary: Negative.    Musculoskeletal:       R knee pain  Neurological: Negative.   Hematological: Negative.   Psychiatric/Behavioral: Negative.        Objective:   Physical Exam Vitals:   05/06/20 1434  BP: (!) 150/113  Pulse: 87  Resp: 18  Temp: 98.8 F (37.1 C)  TempSrc: Oral  SpO2: 98%  Weight: 200 lb (90.7 kg)  Height: 5\' 2"  (1.575 m)    Gen: Pleasant, obese, in no distress,  normal affect  ENT: No lesions,  mouth clear,  oropharynx clear, no postnasal drip, the right second upper molar in the upper jaw is cracked and chipped with a cavitary area with surrounding purulence and gum edema the edge of the tooth is quite sharp that is cracked,    the right shows significant conjunctival inflammation and purulence with purulent drainage from the inferior eyelid  Neck: No JVD, no TMG, no carotid bruits  Lungs: No use of accessory muscles, no dullness to percussion, clear without rales or rhonchi  Cardiovascular: RRR, heart sounds normal, no murmur or gallops, no peripheral edema  Abdomen: soft and NT, no HSM,  BS normal  Musculoskeletal: No deformities, no cyanosis or clubbing  Neuro: alert, non focal  Skin: Warm, no lesions or rashes  No results found.        Assessment & Plan:  I personally reviewed all images and lab data in the [facility] system as well as any outside material available during this office visit and agree with the  radiology impressions.   Dental abscess Dental abscess and fractured tooth molar R upper jaw  Plan augmenting x 7days and prn tramadol  PDMP checked without any opiates Rx  Dental f/u needed  Acute bacterial conjunctivitis of right eye Acute bacterial conjunctivitis of both eyes  Plan is to begin erythromycin ophthalmic ointment placed in the right daily  Essential hypertension Hypertension poorly controlled will add amlodipine 10 mg daily and continue Zestoretic 20/12.5 daily  Patient will have primary care follow-up   Sheri Beck was seen today for  eye drainage.  Diagnoses and all orders for this visit:  Acute bacterial conjunctivitis of right eye  Dental abscess  Essential hypertension  Other orders -     traMADol (ULTRAM) 50 MG tablet; Take 1 tablet (50 mg total) by mouth every 8 (eight) hours as needed for up to 5 days. -     amoxicillin-clavulanate (AUGMENTIN) 875-125 MG tablet; Take 1 tablet by mouth 2 (two) times daily. -     erythromycin ophthalmic ointment; Place 1 application into the right eye at bedtime. -     amLODipine (NORVASC) 10 MG tablet; Take 1 tablet (10 mg  total) by mouth daily.

## 2020-05-06 NOTE — Progress Notes (Signed)
Patient has eaten and taken medication. Patient denies pain at this time.

## 2020-05-06 NOTE — Assessment & Plan Note (Signed)
Hypertension poorly controlled will add amlodipine 10 mg daily and continue Zestoretic 20/12.5 daily  Patient will have primary care follow-up

## 2020-05-06 NOTE — Assessment & Plan Note (Signed)
Acute bacterial conjunctivitis of both eyes  Plan is to begin erythromycin ophthalmic ointment placed in the right daily

## 2020-05-06 NOTE — Assessment & Plan Note (Signed)
Dental abscess and fractured tooth molar R upper jaw  Plan augmenting x 7days and prn tramadol  PDMP checked without any opiates Rx  Dental f/u needed

## 2020-05-06 NOTE — Patient Instructions (Signed)
Take augmentin one twice daily for tooth abscess  Take tramadol as needed every 6 hours one tablet for mouth pain  Start amlodipine one daily in addition to lisinopril/HCTZ for blood pressure  Make appointment with a dentist as soon as you can for tooth extraction  Financial assistance paperwork given

## 2020-05-08 ENCOUNTER — Telehealth: Payer: Self-pay | Admitting: Primary Care

## 2020-05-08 NOTE — Telephone Encounter (Signed)
Copied from CRM [id number]. Topic: Quick Communication - See Telephone Encounter >> May 06, 2020 10:56 AM Aretta Nip wrote: CRM for notification. See Telephone encounter for: 05/06/20. Pt  is requesting FU again from 7/22 CRM for financial assistance/Orange card, verified # as [phone]

## 2020-05-09 NOTE — Telephone Encounter (Signed)
I return Pt called, LVM inform her that she already has a Financial appt on 05/15/20 if she want to speak with me to call me back if not I will see her on her appt date

## 2020-05-12 ENCOUNTER — Telehealth: Payer: Self-pay | Admitting: Primary Care

## 2020-05-12 NOTE — Telephone Encounter (Signed)
Copied from CRM [id number]. Topic: General - Inquiry >> May 12, 2020 12:58 PM Leary Roca wrote: Reason for CRM: Pt is needing more information regarding appt with Pamala Duffel . Please advise

## 2020-05-15 ENCOUNTER — Ambulatory Visit: Payer: Self-pay

## 2020-05-21 ENCOUNTER — Ambulatory Visit: Payer: Self-pay

## 2020-05-23 ENCOUNTER — Ambulatory Visit: Payer: Self-pay

## 2020-05-23 ENCOUNTER — Other Ambulatory Visit: Payer: Self-pay

## 2020-06-04 ENCOUNTER — Other Ambulatory Visit: Payer: Self-pay

## 2020-06-04 ENCOUNTER — Ambulatory Visit (INDEPENDENT_AMBULATORY_CARE_PROVIDER_SITE_OTHER): Payer: Self-pay | Admitting: Primary Care

## 2020-06-04 ENCOUNTER — Encounter (INDEPENDENT_AMBULATORY_CARE_PROVIDER_SITE_OTHER): Payer: Self-pay | Admitting: Primary Care

## 2020-06-04 VITALS — BP 126/85 | HR 92 | Temp 98.1°F | Ht 62.0 in | Wt 200.0 lb

## 2020-06-04 DIAGNOSIS — E785 Hyperlipidemia, unspecified: Secondary | ICD-10-CM

## 2020-06-04 DIAGNOSIS — Z9189 Other specified personal risk factors, not elsewhere classified: Secondary | ICD-10-CM

## 2020-06-04 DIAGNOSIS — I10 Essential (primary) hypertension: Secondary | ICD-10-CM

## 2020-06-04 MED ORDER — LISINOPRIL-HYDROCHLOROTHIAZIDE 20-12.5 MG PO TABS: 1.0000 | ORAL_TABLET | Freq: Every day | ORAL | 1 refills | Status: DC

## 2020-06-04 MED ORDER — AMLODIPINE BESYLATE 10 MG PO TABS: 10.0000 mg | ORAL_TABLET | Freq: Every day | ORAL | 3 refills | Status: DC

## 2020-06-04 NOTE — Patient Instructions (Signed)

## 2020-06-04 NOTE — Progress Notes (Signed)
[place] for  hypertension evaluation, on previous visit medication was adjusted to include amlodipine 75m daily   Patient reports adherence with medications.  Current Medication List Current Outpatient Medications on File Prior to Visit  Medication Sig Dispense Refill  . amoxicillin-clavulanate (AUGMENTIN) 875-125 MG tablet Take 1 tablet by mouth 2 (two) times daily. 14 tablet 0  . aspirin 325 MG EC tablet Take 325 mg by mouth every 6 (six) hours as needed for pain.    .Marland Kitchenatorvastatin (LIPITOR) 40 MG tablet Take 1 tablet (40 mg total) by mouth daily. (Patient not taking: Reported on 08/16/2019) 90 tablet 3  . erythromycin ophthalmic ointment Place 1 application into the right eye at bedtime. 3.5 g 0  . ibuprofen (ADVIL) 800 MG tablet Take 1 tablet (800 mg total) by mouth every 8 (eight) hours as needed. 90 tablet 1   No current facility-administered medications on file prior to visit.   Past Medical History  Past Medical History:  Diagnosis Date  . Hypertension    Dietary habits include: reduce sodium intake  Exercise habits include:walking  Family / Social history: no  ASCVD risk factors include- CMaliThe 10-year ASCVD risk score (Mikey BussingDC Jr., et al., 2013) is: 10.5%   Values used to calculate the score:     Age: [age]years     Sex: Female     Is Non-Hispanic African American: Yes     Diabetic: No     Tobacco smoker: Yes     Systolic Blood Pressure: 1786mmHg     Is BP treated: Yes     HDL Cholesterol: 52 mg/dL     Total Cholesterol: 207 mg/dL O:  Physical Exam Vitals reviewed.  Constitutional:      Appearance: She is obese.  HENT:     Head: Normocephalic.     Right Ear: Tympanic membrane normal.     Left Ear: Tympanic membrane normal.     Nose: Nose normal.  Eyes:     Extraocular Movements: Extraocular movements intact.     Pupils: Pupils are equal, round, and reactive to light.  Cardiovascular:     Rate and Rhythm: Normal rate and  regular rhythm.     Pulses: Normal pulses.     Heart sounds: Normal heart sounds.  Pulmonary:     Effort: Pulmonary effort is normal.     Breath sounds: Normal breath sounds.  Abdominal:     General: Bowel sounds are normal. There is distension.     Palpations: Abdomen is soft.  Musculoskeletal:        General: Normal range of motion.     Cervical back: Normal range of motion and neck supple.  Skin:    General: Skin is warm and dry.  Neurological:     Mental Status: She is alert and oriented to person, place, and time.  Psychiatric:        Mood and Affect: Mood normal.        Behavior: Behavior normal.        Thought Content: Thought content normal.        Judgment: Judgment normal.      Review of Systems  All other systems reviewed and are negative.   Last 3 Office BP readings: BP Readings from Last 3 Encounters:  06/04/20 126/85  05/06/20 (!) 150/113  04/23/20 (!) 169/113    BMET    Component Value Date/Time   NA 142 03/31/2018 1054  K 4.2 03/31/2018 1054   CL 105 03/31/2018 1054   CO2 22 03/31/2018 1054   GLUCOSE 100 (H) 03/31/2018 1054   GLUCOSE 107 (H) 01/05/2017 1901   BUN 14 03/31/2018 1054   CREATININE 0.85 03/31/2018 1054   CALCIUM 9.1 03/31/2018 1054   GFRNONAA 78 03/31/2018 1054   GFRAA 90 03/31/2018 1054    Renal function: CrCl cannot be calculated (Patient's most recent lab result is older than the maximum 21 days allowed.).  Clinical ASCVD: Yes  The 10-year ASCVD risk score Mikey Bussing DC Jr., et al., 2013) is: 10.5%   Values used to calculate the score:     Age: 54 years     Sex: Female     Is Non-Hispanic African American: Yes     Diabetic: No     Tobacco smoker: Yes     Systolic Blood Pressure: 675 mmHg     Is BP treated: Yes     HDL Cholesterol: 52 mg/dL     Total Cholesterol: 207 mg/dL   A/P: Hypertension longstanding diagnosed currently amlodipine 10 mg , and lisinopril-/HCTZ 20/12.59m daily   on current medications. BP Goal <  130/80   MmHg. Goal met.  Patient is adherent with current medications.  -Continued amlodipine 10 mg , and lisinopril-/HCTZ 20/12.574mdaily -F/u labs ordered - past due  -Counseled on lifestyle modifications for blood pressure control including reduced dietary sodium, increased exercise, adequate sleep  Poor dental hygiene -     Ambulatory referral to Dentistry  Hyperlipidemia, unspecified hyperlipidemia type Needs lipid panel . Not taking medication statin  -      MiKerin Perna

## 2020-06-04 NOTE — Progress Notes (Signed)
Here for BP check and dentistry referal

## 2020-06-05 ENCOUNTER — Telehealth: Payer: Self-pay | Admitting: Primary Care

## 2020-06-05 ENCOUNTER — Other Ambulatory Visit (INDEPENDENT_AMBULATORY_CARE_PROVIDER_SITE_OTHER): Payer: Self-pay | Admitting: Primary Care

## 2020-06-05 DIAGNOSIS — E785 Hyperlipidemia, unspecified: Secondary | ICD-10-CM

## 2020-06-05 DIAGNOSIS — I10 Essential (primary) hypertension: Secondary | ICD-10-CM

## 2020-06-05 MED ORDER — LISINOPRIL-HYDROCHLOROTHIAZIDE 20-12.5 MG PO TABS: 1.0000 | ORAL_TABLET | Freq: Every day | ORAL | 1 refills | Status: DC

## 2020-06-05 MED ORDER — AMLODIPINE BESYLATE 10 MG PO TABS
10.0000 mg | ORAL_TABLET | Freq: Every day | ORAL | 3 refills | Status: DC
  Filled 2021-01-14 – 2021-02-12 (×3): qty 30, 30d supply, fill #0
  Filled 2021-04-15: qty 30, 30d supply, fill #1
  Filled 2021-05-21: qty 30, 30d supply, fill #2

## 2020-06-05 MED FILL — LISINOPRIL-HCTZ 20-12.5 MG: 20-12.5 | 30 days supply | Qty: 30 | Fill #1

## 2020-06-05 MED FILL — AMLODIPINE BESYLATE 10 MG T: 10 | 30 days supply | Qty: 30 | Fill #1

## 2020-06-05 NOTE — Telephone Encounter (Signed)
Patient was seen and given antibiotics for her dental infection- patient states she the medication helped- but she still feels like she has infection and she has pain in her mouth- she is requesting RF of antibiotic and pain medication. Patient has not been scheduled per her dental referral yet- she did try to reach out to the office- but she states they did not answer the call. Patient advised I would send her request for review- she would like a call back regarding her request

## 2020-06-05 NOTE — Telephone Encounter (Signed)
Pt called in to ask if provider would refill Rx for  Tramadol 50 MG? Pt says that she has a toothache.   Pt would also like to know if Rx's amLODipine (NORVASC) 10 MG tablet  &  lisinopril-hydrochlorothiazide (ZESTORETIC) 20-12.5 MG tablet could be sent to pharmacy below instead. Pt would like to pick up all 3 Rx tomorrow if possible.     Pharmacy:  [facility] - [place], [place] - [address] Phone:  [phone]  Fax:  [phone]

## 2020-06-05 NOTE — Telephone Encounter (Signed)
Pt was call and told her she was issue Rx card but anyway I will make a duplicate for her and she will ;pick up at [facility]

## 2020-06-05 NOTE — Telephone Encounter (Signed)
Patient request to have medications Rx forwarded to new pharmacy- [facility]. Rx sent to new pharmacy

## 2020-06-05 NOTE — Telephone Encounter (Signed)
Copied from CRM [id number]. Topic: Quick Communication - See Telephone Encounter >> Jun 05, 2020  3:11 PM Sheri Beck wrote: CRM for notification. See Telephone encounter for: 06/05/20.Pt has called and said that she was given an orange card but she had wanted the blue card also for her meds, FU at [phone]

## 2020-06-13 ENCOUNTER — Ambulatory Visit (INDEPENDENT_AMBULATORY_CARE_PROVIDER_SITE_OTHER): Payer: Self-pay | Admitting: Primary Care

## 2020-06-13 ENCOUNTER — Telehealth: Payer: Self-pay | Admitting: Primary Care

## 2020-06-13 NOTE — Telephone Encounter (Signed)
I have sent the referral to [facility] with adult dental clinic. Which is the only clinic that accepts the [facility].

## 2020-06-13 NOTE — Telephone Encounter (Signed)
Please follow up Copied from CRM [id number]. Topic: General - Other >> Jun 13, 2020 12:02 PM Sheri Beck wrote: Reason for CRM:  Pt stated she can not find a dentist that will pull her tooth using the orange card/ please advise if there is a place /urgent tooth told her shed have to pay $120/please advise a list of dentist that accept orange card

## 2020-06-13 NOTE — Telephone Encounter (Signed)
Copied from CRM [id number]. Topic: Referral - Status >> Jun 13, 2020 11:56 AM Sheri Beck wrote: Reason for CRM: Pt is calling and urgent tooth care does accept orange card however they wanted the pt to pay 120.00. pt would like a list of provider would accept the orange card. Pt unable to pay the fee

## 2020-06-23 ENCOUNTER — Ambulatory Visit (INDEPENDENT_AMBULATORY_CARE_PROVIDER_SITE_OTHER): Payer: Self-pay | Admitting: Primary Care

## 2020-06-25 ENCOUNTER — Encounter (INDEPENDENT_AMBULATORY_CARE_PROVIDER_SITE_OTHER): Payer: Self-pay | Admitting: Primary Care

## 2020-06-25 ENCOUNTER — Other Ambulatory Visit: Payer: Self-pay

## 2020-06-25 ENCOUNTER — Ambulatory Visit (INDEPENDENT_AMBULATORY_CARE_PROVIDER_SITE_OTHER): Payer: Self-pay | Admitting: Primary Care

## 2020-06-25 ENCOUNTER — Other Ambulatory Visit (HOSPITAL_COMMUNITY)
Admission: RE | Admit: 2020-06-25 | Discharge: 2020-06-25 | Disposition: A | Discharge status: Home / Self Care | Payer: Self-pay | Source: Ambulatory Visit | Attending: Primary Care | Admitting: Primary Care

## 2020-06-25 ENCOUNTER — Encounter (INDEPENDENT_AMBULATORY_CARE_PROVIDER_SITE_OTHER): Payer: Self-pay

## 2020-06-25 VITALS — BP 128/87 | HR 80 | Temp 97.3°F | Ht 62.0 in | Wt 203.4 lb

## 2020-06-25 DIAGNOSIS — Z124 Encounter for screening for malignant neoplasm of cervix: Secondary | ICD-10-CM | POA: Insufficient documentation

## 2020-06-25 DIAGNOSIS — Z01419 Encounter for gynecological examination (general) (routine) without abnormal findings: Secondary | ICD-10-CM

## 2020-06-25 DIAGNOSIS — K047 Periapical abscess without sinus: Secondary | ICD-10-CM

## 2020-06-25 DIAGNOSIS — K029 Dental caries, unspecified: Secondary | ICD-10-CM

## 2020-06-25 DIAGNOSIS — Z113 Encounter for screening for infections with a predominantly sexual mode of transmission: Secondary | ICD-10-CM | POA: Insufficient documentation

## 2020-06-25 NOTE — Progress Notes (Signed)
Subjective:     Sheri Beck is a 54 y.o. female and is here for a comprehensive physical exam. The patient reports Complains of broken tooth right upper back tooth   Social History   Socioeconomic History  . Marital status: Single    Spouse name: Not on file  . Number of children: Not on file  . Years of education: Not on file  . Highest education level: Not on file  Occupational History  . Not on file  Tobacco Use  . Smoking status: Current Some Day Smoker    Packs/day: 0.10    Types: Cigarettes  . Smokeless tobacco: Never Used  Substance and Sexual Activity  . Alcohol use: Yes    Comment: occasional  . Drug use: Yes    Types: Cocaine    Comment: Pt states last use was 01/02/17.  Marland Kitchen Sexual activity: Yes    Birth control/protection: None  Other Topics Concern  . Not on file  Social History Narrative  . Not on file   Social Determinants of Health   Financial Resource Strain:   . Difficulty of Paying Living Expenses: Not on file  Food Insecurity:   . Worried About Programme researcher, broadcasting/film/video in the Last Year: Not on file  . Ran Out of Food in the Last Year: Not on file  Transportation Needs:   . Lack of Transportation (Medical): Not on file  . Lack of Transportation (Non-Medical): Not on file  Physical Activity:   . Days of Exercise per Week: Not on file  . Minutes of Exercise per Session: Not on file  Stress:   . Feeling of Stress : Not on file  Social Connections:   . Frequency of Communication with Friends and Family: Not on file  . Frequency of Social Gatherings with Friends and Family: Not on file  . Attends Religious Services: Not on file  . Active Member of Clubs or Organizations: Not on file  . Attends Banker Meetings: Not on file  . Marital Status: Not on file  Intimate Partner Violence:   . Fear of Current or Ex-Partner: Not on file  . Emotionally Abused: Not on file  . Physically Abused: Not on file  . Sexually Abused: Not on file   Review  of Systems Pertinent items noted in HPI and remainder of comprehensive ROS otherwise negative.   Objective:  BP 128/87 (BP Location: Right Arm, Patient Position: Sitting, Cuff Size: Large)   Pulse 80   Temp (!) 97.3 F (36.3 C) (Temporal)   Ht 5\' 2"  (1.575 m)   Wt 203 lb 6.4 oz (92.3 kg)   SpO2 94%   BMI 37.20 kg/m      Assessment:  CONSTITUTIONAL: Well-developed, well-nourished female in no acute distress.  HENT:  Normocephalic, atraumatic, External right and left ear normal. Oropharynx is clear and moist EYES: Conjunctivae and EOM are normal. Pupils are equal, round, and reactive to light. No scleral icterus.  NECK: Normal range of motion, supple, no masses.  Normal thyroid.  SKIN: Skin is warm and dry. No rash noted. Not diaphoretic. No erythema. No pallor. NEUROLGIC: Alert and oriented to person, place, and time. Normal reflexes, muscle tone coordination. No cranial nerve deficit noted. PSYCHIATRIC: Normal mood and affect. Normal behavior. Normal judgment and thought content. CARDIOVASCULAR: Normal heart rate noted, regular rhythm RESPIRATORY: Clear to auscultation bilaterally. Effort and breath sounds normal, no problems with respiration noted. BREASTS: Taught SBE ABDOMEN: Soft, normal bowel sounds, no distention  noted.  No tenderness, rebound or guarding.  PELVIC: Normal appearing external genitalia; normal appearing vaginal mucosa and cervix.  No abnormal discharge noted.  Pap smear obtained.  Normal uterine size, no other palpable masses, no uterine or adnexal tenderness. MUSCULOSKELETAL: Normal range of motion. No tenderness.  No cyanosis, clubbing, or edema.  2+ distal pulses.  Healthy female [email or web address]  Plan:     Sheri Beck was seen today for gynecologic exam and annual exam.  Diagnoses and all orders for this visit:  Cervical cancer screening -     Cytology - PAP([facility] [place])  Screening for STD (sexually transmitted disease) -     Cervicovaginal ancillary  only  Infected dental carries -     Ambulatory referral to Dentistry  See After Visit Summary for Counseling Recommendations

## 2020-06-26 LAB — CERVICOVAGINAL ANCILLARY ONLY
Bacterial Vaginitis (gardnerella): POSITIVE — AB
Candida Glabrata: NEGATIVE
Candida Vaginitis: NEGATIVE
Chlamydia: NEGATIVE
Comment: NEGATIVE
Comment: NEGATIVE
Comment: NEGATIVE
Comment: NEGATIVE
Comment: NEGATIVE
Comment: NORMAL
Neisseria Gonorrhea: NEGATIVE
Trichomonas: NEGATIVE

## 2020-06-27 ENCOUNTER — Telehealth (INDEPENDENT_AMBULATORY_CARE_PROVIDER_SITE_OTHER): Payer: Self-pay

## 2020-06-27 ENCOUNTER — Other Ambulatory Visit (INDEPENDENT_AMBULATORY_CARE_PROVIDER_SITE_OTHER): Payer: Self-pay | Admitting: Primary Care

## 2020-06-27 DIAGNOSIS — N760 Acute vaginitis: Secondary | ICD-10-CM

## 2020-06-27 MED ORDER — METRONIDAZOLE 500 MG PO TABS: 500.0000 mg | ORAL_TABLET | Freq: Two times a day (BID) | ORAL | 0 refills | Status: DC

## 2020-06-27 MED FILL — metroNIDAZOLE 500 MG TABS: 500 | 7 days supply | Qty: 14 | Fill #0

## 2020-06-27 NOTE — Telephone Encounter (Signed)
-----   Message from Grayce Sessions, NP sent at 06/27/2020 11:11 AM EDT ----- Bacterial Vaginitis treatment sent to pharmacy do not drink alcohol. STD are negative

## 2020-06-27 NOTE — Telephone Encounter (Signed)
Patient is aware that STD screening is negative. No yeast. Positive for bacteria infection, medication sent to pharmacy. Advised her to not drink alcohol while taking this medication. She verbalized understanding. Maryjean Morn, CMA

## 2020-06-30 LAB — CYTOLOGY - PAP
Comment: NEGATIVE
Diagnosis: NEGATIVE
High risk HPV: NEGATIVE

## 2020-07-01 ENCOUNTER — Telehealth (INDEPENDENT_AMBULATORY_CARE_PROVIDER_SITE_OTHER): Payer: Self-pay

## 2020-07-01 NOTE — Telephone Encounter (Signed)
-----   Message from Grayce Sessions, NP sent at 06/30/2020  3:16 PM EDT ----- Pap smear came back normal.  Recommended to repeat every 3 years

## 2020-07-01 NOTE — Telephone Encounter (Signed)
Patient is aware that pap is normal. Repeat in 3 years. Sheri Beck Sheri Beck, CMA  

## 2020-07-04 NOTE — Telephone Encounter (Signed)
Dental office called stating that they do not accept the orange card. Pt is requesting to have a dental office that does accept orange card. Please advise.

## 2020-08-15 MED FILL — AMLODIPINE BESYLATE 10 MG T: 10 | 30 days supply | Qty: 30 | Fill #2

## 2020-09-12 ENCOUNTER — Ambulatory Visit (INDEPENDENT_AMBULATORY_CARE_PROVIDER_SITE_OTHER): Payer: Self-pay | Admitting: Primary Care

## 2020-09-12 ENCOUNTER — Encounter (INDEPENDENT_AMBULATORY_CARE_PROVIDER_SITE_OTHER): Payer: Self-pay | Admitting: Primary Care

## 2020-09-12 ENCOUNTER — Other Ambulatory Visit: Payer: Self-pay

## 2020-09-12 VITALS — BP 124/83 | HR 80 | Temp 97.2°F | Ht 62.0 in | Wt 197.8 lb

## 2020-09-12 DIAGNOSIS — N6452 Nipple discharge: Secondary | ICD-10-CM

## 2020-09-12 DIAGNOSIS — Z1231 Encounter for screening mammogram for malignant neoplasm of breast: Secondary | ICD-10-CM

## 2020-09-12 DIAGNOSIS — Z23 Encounter for immunization: Secondary | ICD-10-CM

## 2020-09-12 NOTE — Progress Notes (Signed)
Acute Office Visit  Subjective:    Patient ID: Sheri Beck, female    DOB: 08-Aug-1966, 54 y.o.   MRN: [id number]  Chief Complaint  Patient presents with  . Breast Problem    Leaking fluid     HPI  [name] is a 54 year old  femaleis in today for  Nipple discharge. No LMP recorded. Patient is postmenopausal. No pain described as brown and yellow. She is past due for a mammogram. She voices no other complaints . Teasing I am getting ready to get paid if I am pregnant. She does have a history of drug use.    Past Medical History:  Diagnosis Date  . Hypertension     No past surgical history on file.  Family History  Problem Relation Age of Onset  . Cancer Mother     Social History   Socioeconomic History  . Marital status: Single    Spouse name: Not on file  . Number of children: Not on file  . Years of education: Not on file  . Highest education level: Not on file  Occupational History  . Not on file  Tobacco Use  . Smoking status: Current Some Day Smoker    Packs/day: 0.10    Types: Cigarettes  . Smokeless tobacco: Never Used  Substance and Sexual Activity  . Alcohol use: Yes    Comment: occasional  . Drug use: Yes    Types: Cocaine    Comment: Pt states last use was 01/02/17.  Marland Kitchen Sexual activity: Yes    Birth control/protection: None  Other Topics Concern  . Not on file  Social History Narrative  . Not on file   Social Determinants of Health   Financial Resource Strain: Not on file  Food Insecurity: Not on file  Transportation Needs: Not on file  Physical Activity: Not on file  Stress: Not on file  Social Connections: Not on file  Intimate Partner Violence: Not on file    Outpatient Medications Prior to Visit  Medication Sig Dispense Refill  . amLODipine (NORVASC) 10 MG tablet Take 1 tablet (10 mg total) by mouth daily. 90 tablet 3  . aspirin 325 MG EC tablet Take 325 mg by mouth every 6 (six) hours as needed for pain.    Marland Kitchen  lisinopril-hydrochlorothiazide (ZESTORETIC) 20-12.5 MG tablet Take 1 tablet by mouth daily. (Patient not taking: Reported on 09/12/2020) 90 tablet 1  . atorvastatin (LIPITOR) 40 MG tablet Take 1 tablet (40 mg total) by mouth daily. (Patient not taking: Reported on 08/16/2019) 90 tablet 3  . ibuprofen (ADVIL) 800 MG tablet Take 1 tablet (800 mg total) by mouth every 8 (eight) hours as needed. 90 tablet 1  . metroNIDAZOLE (FLAGYL) 500 MG tablet Take 1 tablet (500 mg total) by mouth 2 (two) times daily. 14 tablet 0   No facility-administered medications prior to visit.    No Known Allergies  Review of Systems  Skin:       Nipple discharge   All other systems reviewed and are negative.      Objective:    Physical Exam Vitals reviewed.  Constitutional:      Appearance: She is obese.  HENT:     Head: Normocephalic.  Cardiovascular:     Rate and Rhythm: Normal rate and regular rhythm.  Pulmonary:     Effort: Pulmonary effort is normal.     Breath sounds: Normal breath sounds.  Chest:    Abdominal:  General: Bowel sounds are normal. There is distension.     Palpations: Abdomen is soft.  Musculoskeletal:        General: Normal range of motion.  Skin:    General: Skin is warm and dry.  Neurological:     Mental Status: She is alert and oriented to person, place, and time.  Psychiatric:        Mood and Affect: Mood normal.        Behavior: Behavior normal.        Thought Content: Thought content normal.        Judgment: Judgment normal.     BP 124/83 (BP Location: Right Arm, Patient Position: Sitting, Cuff Size: Normal)   Pulse 80   Temp (!) 97.2 F (36.2 C) (Temporal)   Ht 5\' 2"  (1.575 m)   Wt 197 lb 12.8 oz (89.7 kg)   SpO2 92%   BMI 36.18 kg/m  Wt Readings from Last 3 Encounters:  09/12/20 197 lb 12.8 oz (89.7 kg)  06/25/20 203 lb 6.4 oz (92.3 kg)  06/04/20 200 lb (90.7 kg)    Health Maintenance Due  Topic Date Due  . MAMMOGRAM  11/12/2013  . Fecal DNA  (Cologuard)  Never done  . COVID-19 Vaccine (3 - Booster for Pfizer series) 08/08/2020    There are no preventive care reminders to display for this patient.   Lab Results  Component Value Date   TSH 2.620 03/31/2018   Lab Results  Component Value Date   WBC 5.8 03/31/2018   HGB 14.3 03/31/2018   HCT 43.6 03/31/2018   MCV 88 03/31/2018   PLT 209 03/31/2018   Lab Results  Component Value Date   NA 142 03/31/2018   K 4.2 03/31/2018   CO2 22 03/31/2018   GLUCOSE 100 (H) 03/31/2018   BUN 14 03/31/2018   CREATININE 0.85 03/31/2018   BILITOT 0.4 03/31/2018   ALKPHOS 67 03/31/2018   AST 16 03/31/2018   ALT 21 03/31/2018   PROT 7.1 03/31/2018   ALBUMIN 4.3 03/31/2018   CALCIUM 9.1 03/31/2018   ANIONGAP 7 01/05/2017   Lab Results  Component Value Date   CHOL 207 (H) 03/31/2018   Lab Results  Component Value Date   HDL 52 03/31/2018   Lab Results  Component Value Date   LDLCALC 123 (H) 03/31/2018   Lab Results  Component Value Date   TRIG 160 (H) 03/31/2018   Lab Results  Component Value Date   CHOLHDL 4.0 03/31/2018   Lab Results  Component Value Date   HGBA1C 5.9 (A) 04/23/2020       Assessment & Plan:   [name] was seen today for breast problem.  Diagnoses and all orders for this visit:  Discharge from both nipples -     CBC with Differential -     Anaerobic and Aerobic Culture  Breast cancer screening by mammogram Patient completed application for BCCP while in clinic and application has and [facility] to [facility]. Patient aware that [facility] will contact her directly to schedule appointment. -     MM Digital Diagnostic Bilat; Future  Need for immunization against influenza Health maintenance and care gap -     Flu Vaccine QUAD 36+ mos IM    No orders of the defined types were placed in this encounter.    [facility], NP

## 2020-09-13 LAB — CBC WITH DIFFERENTIAL/PLATELET
Basophils Absolute: 0.1 10*3/uL (ref 0.0–0.2)
Basos: 1 %
EOS (ABSOLUTE): 0.2 10*3/uL (ref 0.0–0.4)
Eos: 2 %
Hematocrit: 44.6 % (ref 34.0–46.6)
Hemoglobin: 15 g/dL (ref 11.1–15.9)
Immature Grans (Abs): 0 10*3/uL (ref 0.0–0.1)
Immature Granulocytes: 0 %
Lymphocytes Absolute: 3 10*3/uL (ref 0.7–3.1)
Lymphs: 41 %
MCH: 29.6 pg (ref 26.6–33.0)
MCHC: 33.6 g/dL (ref 31.5–35.7)
MCV: 88 fL (ref 79–97)
Monocytes Absolute: 0.6 10*3/uL (ref 0.1–0.9)
Monocytes: 9 %
Neutrophils Absolute: 3.4 10*3/uL (ref 1.4–7.0)
Neutrophils: 47 %
Platelets: 289 10*3/uL (ref 150–450)
RBC: 5.06 x10E6/uL (ref 3.77–5.28)
RDW: 12.9 % (ref 11.7–15.4)
WBC: 7.2 10*3/uL (ref 3.4–10.8)

## 2020-09-18 LAB — ANAEROBIC AND AEROBIC CULTURE: Result 1: NEGATIVE — AB

## 2020-09-19 ENCOUNTER — Other Ambulatory Visit (INDEPENDENT_AMBULATORY_CARE_PROVIDER_SITE_OTHER): Payer: Self-pay | Admitting: Primary Care

## 2020-09-19 MED ORDER — METRONIDAZOLE 500 MG PO TABS: 500.0000 mg | ORAL_TABLET | Freq: Three times a day (TID) | ORAL | 0 refills | Status: DC

## 2020-09-19 MED ORDER — SULFAMETHOXAZOLE-TRIMETHOPRIM 800-160 MG PO TABS: 1.0000 | ORAL_TABLET | Freq: Two times a day (BID) | ORAL | 0 refills | Status: DC

## 2020-09-19 MED FILL — SULFAMETHOXAZOLE-TMP DS TAB: 800-160 | 10 days supply | Qty: 20 | Fill #0

## 2020-09-19 MED FILL — metroNIDAZOLE 500 MG TABS: 500 | 10 days supply | Qty: 30 | Fill #0

## 2020-09-19 NOTE — Progress Notes (Signed)
Consulted with Dr. Daiva Eves  Regarding culture results with no sensitivity . Advised to cover with Bactrim DS bid or Levaquin and Flagyl tid for 10 days. Patient has a current hx of drug abuse will call patient to stress importance of taking medication and no alcohol for completion of abt's.  Dr. Delford Field returns my call and agrees with ID physician but feels Levaquin will be better tolerated and she will need probiotics. D/C Septra. Also, gave order to use congregational funds and approved by Dr. Delford Field. 09/23/20 Unable to reach patient since results reviewed.

## 2020-09-23 ENCOUNTER — Other Ambulatory Visit (INDEPENDENT_AMBULATORY_CARE_PROVIDER_SITE_OTHER): Payer: Self-pay | Admitting: Primary Care

## 2020-09-23 DIAGNOSIS — N6452 Nipple discharge: Secondary | ICD-10-CM

## 2020-09-23 MED ORDER — METRONIDAZOLE 500 MG PO TABS: 500.0000 mg | ORAL_TABLET | Freq: Three times a day (TID) | ORAL | 0 refills | Status: DC

## 2020-09-23 MED ORDER — UP4 PROBIOTICS WOMENS PO CAPS: 1.0000 | ORAL_CAPSULE | Freq: Every day | ORAL | 0 refills | Status: DC

## 2020-09-23 MED ORDER — LEVOFLOXACIN 500 MG PO TABS: 500.0000 mg | ORAL_TABLET | Freq: Every day | ORAL | 0 refills | Status: DC

## 2020-09-23 MED FILL — levoFLOXacin 500 MG TABS: 500 | 10 days supply | Qty: 10 | Fill #0

## 2020-10-03 ENCOUNTER — Encounter (INDEPENDENT_AMBULATORY_CARE_PROVIDER_SITE_OTHER): Payer: Self-pay | Admitting: Primary Care

## 2020-10-05 ENCOUNTER — Other Ambulatory Visit: Payer: Self-pay

## 2020-10-05 ENCOUNTER — Ambulatory Visit (HOSPITAL_COMMUNITY): Admission: EM | Admit: 2020-10-05 | Discharge: 2020-10-05 | Discharge status: Against Medical Advice | Payer: Self-pay

## 2020-10-05 NOTE — ED Notes (Signed)
Pt called to phone number and waiting area, no answer.

## 2020-10-05 NOTE — ED Notes (Signed)
Pt called in lobby and on cellphone, no response.  

## 2020-10-16 MED FILL — IBUPROFEN 800 MG TABLET: 800 | 30 days supply | Qty: 90 | Fill #1

## 2020-10-16 MED FILL — AMLODIPINE BESYLATE 10 MG T: 10 | 30 days supply | Qty: 30 | Fill #3

## 2020-12-02 MED FILL — AMLODIPINE BESYLATE 10 MG T: 10 | 30 days supply | Qty: 30 | Fill #0

## 2020-12-08 ENCOUNTER — Ambulatory Visit (INDEPENDENT_AMBULATORY_CARE_PROVIDER_SITE_OTHER): Payer: Self-pay | Admitting: Primary Care

## 2021-01-03 ENCOUNTER — Other Ambulatory Visit: Payer: Self-pay

## 2021-01-14 ENCOUNTER — Other Ambulatory Visit: Payer: Self-pay

## 2021-01-14 ENCOUNTER — Telehealth: Payer: Self-pay | Admitting: Primary Care

## 2021-01-14 NOTE — Telephone Encounter (Signed)
Called patient and no voice mail was set up. I called the [facility] pharmacy and I was told that the patient still has refills on her medication. Pharmacy will go ahead and refill the med.     Copied from CRM [id number]. Topic: Quick Communication - Rx Refill/Question >> Jan 13, 2021  9:59 AM Aretta Nip wrote: Medication:amLODipine (NORVASC) 10 MG tablet Pt out of this med, states taking old BP prescribed. Pt offered appt and declined. Pls advise pt if appt needed [phone]  Has the patient contacted their pharmacy? yes (Agent: If no, request that the patient contact the pharmacy for the refill.) (Agent: If yes, when and what did the pharmacy advise?) call dr   Preferred Pharmacy (with phone number or street name):     [facility]  Phone:  [phone] Fax:  [phone]     Agent: Please be advised that RX refills may take up to 3 business days. We ask that you follow-up with your pharmacy.

## 2021-01-21 ENCOUNTER — Other Ambulatory Visit: Payer: Self-pay

## 2021-02-04 ENCOUNTER — Other Ambulatory Visit: Payer: Self-pay

## 2021-02-11 ENCOUNTER — Other Ambulatory Visit: Payer: Self-pay

## 2021-02-12 ENCOUNTER — Other Ambulatory Visit: Payer: Self-pay

## 2021-04-15 ENCOUNTER — Other Ambulatory Visit: Payer: Self-pay

## 2021-04-16 ENCOUNTER — Other Ambulatory Visit: Payer: Self-pay

## 2021-05-21 ENCOUNTER — Other Ambulatory Visit: Payer: Self-pay

## 2021-06-02 ENCOUNTER — Other Ambulatory Visit: Payer: Self-pay

## 2021-06-19 ENCOUNTER — Other Ambulatory Visit (INDEPENDENT_AMBULATORY_CARE_PROVIDER_SITE_OTHER): Payer: Self-pay | Admitting: Primary Care

## 2021-06-19 ENCOUNTER — Other Ambulatory Visit: Payer: Self-pay

## 2021-06-19 DIAGNOSIS — I10 Essential (primary) hypertension: Secondary | ICD-10-CM

## 2021-06-19 NOTE — Telephone Encounter (Signed)
Sent to PCP ?

## 2021-06-19 NOTE — Telephone Encounter (Signed)
Medication: Rx #: [id number] amLODipine (NORVASC) 10 MG tablet [[id number]] , aspirin 325 MG EC tablet [[id number]] , ibuprofen (ADVIL) 800 MG tablet [[id number]]  DISCONTINUED   Has the patient contacted their pharmacy? Yes Pt states she is out of refills to call the doctors office (Agent: If no, request that the patient contact the pharmacy for the refill.) (Agent: If yes, when and what did the pharmacy advise?)  Preferred Pharmacy (with phone number or street name): [facility] [address] [place] [address] Phone: [phone] Fax: [phone] Hours: M-F 8:30a-5:30p   Has the patient been seen for an appointment in the last year OR does the patient have an upcoming appointment? YES 09/12/20  Agent: Please be advised that RX refills may take up to 3 business days. We ask that you follow-up with your pharmacy.

## 2021-06-19 NOTE — Telephone Encounter (Signed)
Requested medication (s) are due for refill today:   No for 2 and Yes for amlodipine  Requested medication (s) are on the active medication list:   Yes for amlodipine, no for ibuprofen, aspirin rx from 4 yrs ago  Future visit scheduled:   Yes in 3 days   Last ordered: Amlodipine 06/05/2020 #90, 3 refills;     aspirin request is expired from 4 yrs ago;   ibuprofen discontinued 01/03/2021 but a refill request was submitted.   Returned for provider to review for refills.   Requested Prescriptions  Pending Prescriptions Disp Refills   aspirin 325 MG EC tablet      Sig: Take 1 tablet (325 mg total) by mouth every 6 (six) hours as needed for pain.     Analgesics:  NSAIDS - aspirin Passed - 06/19/2021 11:41 AM      Passed - Patient is not pregnant      Passed - Valid encounter within last 12 months    Recent Outpatient Visits           9 months ago Discharge from both nipples   [facility] Grayce Sessions, NP   11 months ago Cervical cancer screening   [facility] Grayce Sessions, NP   1 year ago Poor dental hygiene   [facility] Grayce Sessions, NP   1 year ago Prediabetes   [facility] Grayce Sessions, NP   1 year ago Hypertension, unspecified type   [facility] Grayce Sessions, NP       Future Appointments             In 3 days Grayce Sessions, NP [facility]             ibuprofen (ADVIL) 800 MG tablet 90 tablet 1    Sig: TAKE 1 TABLET (800 MG TOTAL) BY MOUTH EVERY 8 (EIGHT) HOURS AS NEEDED.     Analgesics:  NSAIDS Failed - 06/19/2021 11:41 AM      Failed - Cr in normal range and within 360 days    Creatinine, Ser  Date Value Ref Range Status  03/31/2018 0.85 0.57 - 1.00 mg/dL Final          Passed - HGB in normal range and within 360 days    Hemoglobin  Date Value Ref Range Status  09/12/2020 15.0 11.1 -  15.9 g/dL Final          Passed - Patient is not pregnant      Passed - Valid encounter within last 12 months    Recent Outpatient Visits           9 months ago Discharge from both nipples   [facility] Grayce Sessions, NP   11 months ago Cervical cancer screening   [facility] Grayce Sessions, NP   1 year ago Poor dental hygiene   [facility] Grayce Sessions, NP   1 year ago Prediabetes   [facility] Grayce Sessions, NP   1 year ago Hypertension, unspecified type   [facility] Grayce Sessions, NP       Future Appointments             In 3 days Grayce Sessions, NP [facility]  amLODipine (NORVASC) 10 MG tablet 90 tablet 0    Sig: Take 1 tablet (10 mg total) by mouth daily.     Cardiovascular:  Calcium Channel Blockers Failed - 06/19/2021 11:41 AM      Failed - Valid encounter within last 6 months    Recent Outpatient Visits           9 months ago Discharge from both nipples   [facility] Grayce Sessions, NP   11 months ago Cervical cancer screening   [facility] Grayce Sessions, NP   1 year ago Poor dental hygiene   [facility] Grayce Sessions, NP   1 year ago Prediabetes   [facility] Grayce Sessions, NP   1 year ago Hypertension, unspecified type   [facility] Grayce Sessions, NP       Future Appointments             In 3 days Grayce Sessions, NP [facility]            Passed - Last BP in normal range    BP Readings from Last 1 Encounters:  09/12/20 124/83

## 2021-06-21 ENCOUNTER — Emergency Department (HOSPITAL_COMMUNITY): Admission: EM | Admit: 2021-06-21 | Discharge: 2021-06-21 | Discharge status: Against Medical Advice | Payer: Self-pay

## 2021-06-21 NOTE — ED Notes (Signed)
No answer in lobby for triage  

## 2021-06-21 NOTE — ED Notes (Signed)
Called pt to triage no response. Unable to locate

## 2021-06-22 ENCOUNTER — Emergency Department (HOSPITAL_COMMUNITY)
Admission: EM | Admit: 2021-06-22 | Discharge: 2021-06-22 | Disposition: A | Discharge status: Home / Self Care | Payer: Self-pay | Attending: Emergency Medicine | Admitting: Emergency Medicine

## 2021-06-22 ENCOUNTER — Encounter (HOSPITAL_COMMUNITY): Payer: Self-pay | Admitting: Emergency Medicine

## 2021-06-22 ENCOUNTER — Other Ambulatory Visit: Payer: Self-pay

## 2021-06-22 ENCOUNTER — Emergency Department (HOSPITAL_COMMUNITY): Payer: Self-pay

## 2021-06-22 ENCOUNTER — Ambulatory Visit (INDEPENDENT_AMBULATORY_CARE_PROVIDER_SITE_OTHER): Payer: Self-pay | Admitting: Primary Care

## 2021-06-22 DIAGNOSIS — Y92410 Unspecified street and highway as the place of occurrence of the external cause: Secondary | ICD-10-CM | POA: Insufficient documentation

## 2021-06-22 DIAGNOSIS — M545 Low back pain, unspecified: Secondary | ICD-10-CM | POA: Insufficient documentation

## 2021-06-22 DIAGNOSIS — H9201 Otalgia, right ear: Secondary | ICD-10-CM | POA: Insufficient documentation

## 2021-06-22 DIAGNOSIS — Z5321 Procedure and treatment not carried out due to patient leaving prior to being seen by health care provider: Secondary | ICD-10-CM | POA: Insufficient documentation

## 2021-06-22 DIAGNOSIS — M542 Cervicalgia: Secondary | ICD-10-CM | POA: Insufficient documentation

## 2021-06-22 NOTE — ED Notes (Signed)
Called pt for vitals no response  
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
## 2021-06-23 ENCOUNTER — Telehealth (INDEPENDENT_AMBULATORY_CARE_PROVIDER_SITE_OTHER): Payer: Self-pay

## 2021-06-23 ENCOUNTER — Other Ambulatory Visit: Payer: Self-pay

## 2021-06-23 ENCOUNTER — Other Ambulatory Visit (INDEPENDENT_AMBULATORY_CARE_PROVIDER_SITE_OTHER): Payer: Self-pay | Admitting: Primary Care

## 2021-06-23 DIAGNOSIS — E785 Hyperlipidemia, unspecified: Secondary | ICD-10-CM

## 2021-06-23 DIAGNOSIS — I10 Essential (primary) hypertension: Secondary | ICD-10-CM

## 2021-06-23 MED ORDER — LISINOPRIL-HYDROCHLOROTHIAZIDE 20-12.5 MG PO TABS
1.0000 | ORAL_TABLET | Freq: Every day | ORAL | 0 refills | Status: DC
  Filled 2021-06-23: qty 30, 30d supply, fill #0

## 2021-06-23 MED ORDER — AMLODIPINE BESYLATE 10 MG PO TABS
10.0000 mg | ORAL_TABLET | Freq: Every day | ORAL | 0 refills | Status: DC
  Filled 2021-06-23: qty 30, 30d supply, fill #0

## 2021-06-23 NOTE — Telephone Encounter (Signed)
Copied from CRM [id number]. Topic: General - Other >> Jun 23, 2021  8:42 AM Jaquita Rector A wrote: Reason for CRM: Patient called in asking to speak to Gwinda Passe say that she missed her visit on 06/22/21 because she was at the hospital and must be seen today, Asking for a call back from Ms Randa Evens ASAP at Ph#  [phone]   Left message asking patient to return call to office at [phone]. She will need to schedule hospital follow up. Maryjean Morn, CMA

## 2021-06-24 ENCOUNTER — Telehealth (INDEPENDENT_AMBULATORY_CARE_PROVIDER_SITE_OTHER): Payer: Self-pay

## 2021-06-24 NOTE — Telephone Encounter (Signed)
Copied from CRM [id number]. Topic: General - Other >> Jun 23, 2021 12:48 PM [name], [name] wrote: Reason for CRM: patient called wants a cb regarding questions she has about plasma paperwork  she received. Please call back  Spoke with patient. She states that plasma center wanted to do STD testing on her but was unable to complete blood draw because her vein was too small. She wants PCP to order testing and complete. Advised patient to discuss with PCP at [facility] on 10/25. Patient stated that was too long to wait she will go to health department. Maryjean Morn, CMA

## 2021-06-30 ENCOUNTER — Other Ambulatory Visit: Payer: Self-pay

## 2021-07-01 ENCOUNTER — Other Ambulatory Visit: Payer: Self-pay

## 2021-07-28 ENCOUNTER — Inpatient Hospital Stay (INDEPENDENT_AMBULATORY_CARE_PROVIDER_SITE_OTHER): Payer: Self-pay | Admitting: Primary Care

## 2021-12-06 ENCOUNTER — Other Ambulatory Visit: Payer: Self-pay

## 2021-12-06 ENCOUNTER — Encounter (HOSPITAL_COMMUNITY): Payer: Self-pay

## 2021-12-06 ENCOUNTER — Ambulatory Visit (HOSPITAL_COMMUNITY)
Admission: EM | Admit: 2021-12-06 | Discharge: 2021-12-06 | Disposition: A | Discharge status: Home / Self Care | Payer: Self-pay | Attending: Internal Medicine | Admitting: Internal Medicine

## 2021-12-06 DIAGNOSIS — E785 Hyperlipidemia, unspecified: Secondary | ICD-10-CM

## 2021-12-06 DIAGNOSIS — I10 Essential (primary) hypertension: Secondary | ICD-10-CM

## 2021-12-06 DIAGNOSIS — K219 Gastro-esophageal reflux disease without esophagitis: Secondary | ICD-10-CM

## 2021-12-06 MED ORDER — FAMOTIDINE 20 MG PO TABS: 20.0000 mg | ORAL_TABLET | Freq: Two times a day (BID) | ORAL | 0 refills | Status: DC

## 2021-12-06 MED ORDER — AMLODIPINE BESYLATE 10 MG PO TABS: 10.0000 mg | ORAL_TABLET | Freq: Every day | ORAL | 1 refills | Status: DC

## 2021-12-06 MED ORDER — LISINOPRIL-HYDROCHLOROTHIAZIDE 20-12.5 MG PO TABS: 1.0000 | ORAL_TABLET | Freq: Every day | ORAL | 1 refills | Status: DC

## 2021-12-06 NOTE — ED Provider Notes (Signed)
?[facility] ? ? ? ?CSN: [id number] ?Arrival date & time: 12/06/21  1401 ? ? ?  ? ?History   ?Chief Complaint ?Chief Complaint  ?Patient presents with  ? Chest Pain  ? ? ?HPI ?Sheri Beck is a 56 y.o. female.  ? ?Patient presents with intermittent right-sided chest tightness for 1 week.  Symptoms are worse after eating and when lying down.  Pain does not radiate.  Rating a 6 out of 10.  Associated bloating.  Has not attempted treatment of symptoms.  Daily smoker.  Denies associated shortness of breath, wheezing, dizziness, lightheadedness, blurred vision, nausea or vomiting.  History of hyperlipidemia and hypertension.  Patient endorses that she has not seen her primary doctor in over a year and has not been taking her blood pressure medications. ? ? ? ? ?Past Medical History:  ?Diagnosis Date  ? Hypertension   ? ? ?Patient Active Problem List  ? Diagnosis Date Noted  ? Acute bacterial conjunctivitis of right eye 05/06/2020  ? Dental abscess 05/06/2020  ? Essential hypertension 05/06/2020  ? ? ?History reviewed. No pertinent surgical history. ? ?OB History   ?No obstetric history on file. ?  ? ? ? ?Home Medications   ? ?Prior to Admission medications   ?Medication Sig Start Date End Date Taking? Authorizing Provider  ?amLODipine (NORVASC) 10 MG tablet Take 1 tablet (10 mg total) by mouth daily. 06/23/21   Grayce Sessions, NP  ?aspirin 325 MG EC tablet Take 325 mg by mouth every 6 (six) hours as needed for pain.    [provider]  ?levofloxacin (LEVAQUIN) 500 MG tablet Take 1 tablet (500 mg total) by mouth daily. Congregational approved by Dr Delford Field 09/23/20   Grayce Sessions, NP  ?lisinopril-hydrochlorothiazide (ZESTORETIC) 20-12.5 MG tablet Take 1 tablet by mouth daily. 06/23/21   Grayce Sessions, NP  ?metroNIDAZOLE (FLAGYL) 500 MG tablet Take 1 tablet (500 mg total) by mouth 3 (three) times daily. Congregational approved by Dr Delford Field 09/23/20   Grayce Sessions, NP  ?Probiotic  Product (UP4 PROBIOTICS WOMENS) CAPS Take 1 capsule by mouth daily. Congregational approved by Dr Delford Field 09/23/20   Grayce Sessions, NP  ? ? ?Family History ?Family History  ?Problem Relation Age of Onset  ? Cancer Mother   ? ? ?Social History ?Social History  ? ?Tobacco Use  ? Smoking status: Some Days  ?  Packs/day: 0.10  ?  Types: Cigarettes  ? Smokeless tobacco: Never  ?Substance Use Topics  ? Alcohol use: Yes  ?  Comment: occasional  ? Drug use: Yes  ?  Types: Cocaine  ?  Comment: Pt states last use was 01/02/17.  ? ? ? ?Allergies   ?Patient has no known allergies. ? ? ?Review of Systems ?Review of Systems  ?Constitutional: Negative.   ?HENT: Negative.    ?Respiratory: Negative.    ?Cardiovascular:  Positive for chest pain. Negative for palpitations and leg swelling.  ?Gastrointestinal: Negative.   ?Skin: Negative.   ?Neurological: Negative.   ? ? ?Physical Exam ?Triage Vital Signs ?ED Triage Vitals  ?Enc Vitals Group  ?   BP 12/06/21 1414 (!) 165/111  ?   Pulse Rate 12/06/21 1414 70  ?   Resp 12/06/21 1414 20  ?   Temp 12/06/21 1414 98.3 ?F (36.8 ?C)  ?   Temp Source 12/06/21 1414 Oral  ?   SpO2 12/06/21 1414 98 %  ?   Weight --   ?  Height --   ?   Head Circumference --   ?   Peak Flow --   ?   Pain Score 12/06/21 1412 6  ?   Pain Loc --   ?   Pain Edu? --   ?   Excl. in GC? --   ? ?No data found. ? ?Updated Vital Signs ?BP (!) 165/103 (BP Location: Right Arm)   Pulse 70   Temp 98.3 ?F (36.8 ?C) (Oral)   Resp 20   SpO2 98%  ? ?Visual Acuity ?Right Eye Distance:   ?Left Eye Distance:   ?Bilateral Distance:   ? ?Right Eye Near:   ?Left Eye Near:    ?Bilateral Near:    ? ?Physical Exam ?Constitutional:   ?   Appearance: Normal appearance.  ?Eyes:  ?   Extraocular Movements: Extraocular movements intact.  ?Cardiovascular:  ?   Rate and Rhythm: Normal rate and regular rhythm.  ?   Pulses: Normal pulses.  ?   Heart sounds: Normal heart sounds.  ?Pulmonary:  ?   Effort: Pulmonary effort is normal.  ?   Breath  sounds: Normal breath sounds.  ?Skin: ?   General: Skin is warm and dry.  ?Neurological:  ?   Mental Status: She is alert and oriented to person, place, and time. Mental status is at baseline.  ?Psychiatric:     ?   Mood and Affect: Mood normal.     ?   Behavior: Behavior normal.  ? ? ? ?UC Treatments / Results  ?Labs ?(all labs ordered are listed, but only abnormal results are displayed) ?Labs Reviewed - No data to display ? ?EKG ? ? ?Radiology ?No results found. ? ?Procedures ?Procedures (including critical care time) ? ?Medications Ordered in UC ?Medications - No data to display ? ?Initial Impression / Assessment and Plan / UC Course  ?I have reviewed the triage vital signs and the nursing notes. ? ?Pertinent labs & imaging results that were available during my care of the patient were reviewed by me and considered in my medical decision making (see chart for details). ? ?GERD without esophagitis ?Elevated blood pressure reading in office with diagnosis of hypertension ? ?Vital signs are stable, blood pressure elevated at 165/103 however patient endorses that she has not taken her medication in months, endorses chest pain but upon further evaluation symptoms are more consistent with GERD, therefore no signs of hypertensive urgency, famotidine 2-week course prescribed, may use over-the-counter gas reduction medication in addition, recommended bland diet and sitting up 30 minutes after eating, blood pressure medication refilled for 2 months, given written information for PCPs office to schedule an appointment, advised patient to monitor record blood pressure until seen by PCP, may follow-up with urgent care as needed, given strict precautions for any worsening signs chest tightness to go to the nearest emergency department for evaluation and management ?Final Clinical Impressions(s) / UC Diagnoses  ? ?Final diagnoses:  ?None  ? ?Discharge Instructions   ?None ?  ? ?ED Prescriptions   ?None ?  ? ?PDMP not reviewed  this encounter. ?  ?Valinda Hoar, NP ?12/06/21 1450 ? ?

## 2021-12-06 NOTE — Discharge Instructions (Signed)
I believe your symptoms today are being caused by acid reflux ? ?Take famotidine twice a day for the next 14 days to reduce stomach acid, you may use any over-the-counter medications in addition such as Tums, Gas-X, simethicone, Maalox ? ?Please avoid foods that are spicy, greasy, contain a large amount of onions or tomato based to prevent further irritation ? ?Please sit up for at least 30 minutes after meals to prevent irritation at bedtime ? ?You may follow-up with urgent care for this issue as needed ? ?At any point if your chest tightness worsens in severity or frequency you will need to go to the nearest emergency department for work-up for your heart ? ?You have been given information for your primary care doctor, please reach out to the office to schedule an appointment for further management and evaluation of your blood pressure ? ?Please begin taking your daily blood pressure medication as directed ? ?Begin taking your blood pressure daily and recording, take this to your primary care doctor appointment ? ?You may follow-up with urgent care as needed ?

## 2021-12-06 NOTE — ED Triage Notes (Signed)
Pt c/o medial chest pain (1 week). She states it feels heavy.  Patient does states at times she has SOB but denies other symptoms. ? ?Pt c/o mole to left side of eye is bothering her and is requesting medication refill for BP meds. ?

## 2022-01-07 ENCOUNTER — Inpatient Hospital Stay (INDEPENDENT_AMBULATORY_CARE_PROVIDER_SITE_OTHER): Payer: Self-pay | Admitting: Primary Care

## 2022-01-07 ENCOUNTER — Telehealth (INDEPENDENT_AMBULATORY_CARE_PROVIDER_SITE_OTHER): Payer: Self-pay

## 2022-01-07 ENCOUNTER — Encounter (INDEPENDENT_AMBULATORY_CARE_PROVIDER_SITE_OTHER): Payer: Self-pay | Admitting: Primary Care

## 2022-01-07 ENCOUNTER — Ambulatory Visit (INDEPENDENT_AMBULATORY_CARE_PROVIDER_SITE_OTHER): Payer: 59 | Admitting: Primary Care

## 2022-01-07 VITALS — BP 118/84 | HR 70 | Temp 97.7°F | Ht 62.0 in | Wt 212.0 lb

## 2022-01-07 DIAGNOSIS — H5710 Ocular pain, unspecified eye: Secondary | ICD-10-CM | POA: Diagnosis not present

## 2022-01-07 DIAGNOSIS — I10 Essential (primary) hypertension: Secondary | ICD-10-CM

## 2022-01-07 DIAGNOSIS — Z09 Encounter for follow-up examination after completed treatment for conditions other than malignant neoplasm: Secondary | ICD-10-CM

## 2022-01-07 DIAGNOSIS — E785 Hyperlipidemia, unspecified: Secondary | ICD-10-CM

## 2022-01-07 MED ORDER — LISINOPRIL-HYDROCHLOROTHIAZIDE 20-12.5 MG PO TABS: 1.0000 | ORAL_TABLET | Freq: Every day | ORAL | 1 refills | Status: DC

## 2022-01-07 MED ORDER — AMLODIPINE BESYLATE 10 MG PO TABS: 10.0000 mg | ORAL_TABLET | Freq: Every day | ORAL | 1 refills | Status: DC

## 2022-01-07 NOTE — Telephone Encounter (Addendum)
Copied from [place] [id number]. Topic: General - Other ?>> Jan 07, 2022 11:51 AM Tessa Lerner A wrote: ?Reason for CRM: The patient has been in contact with their insurance provider and been told that a referral to the following locations would be covered under their insurance  ? ?[facility] or [facility]  ? ?Please contact the patient further if needed ?Referred to above eye care ?

## 2022-01-07 NOTE — Progress Notes (Addendum)
?[facility] ? ? ?Subjective:  ? Sheri Beck is a 56 y.o. female presents for emergency room follow up . She presented to the ED on 12/06/21 for intermittent right-sided chest tightness for 1 week.  Symptoms are worse after eating and when lying down.  Pain does not radiate.  Rating a 6 out of 10.  Associated bloating.  Has not attempted treatment of symptoms. Bp was also elevated.Denies shortness of breath, headaches, chest pain or lower extremity edema . ?Past Medical History:  ?Diagnosis Date  ? Hypertension   ?  ? ?No Known Allergies ?  ?Current Outpatient Medications on File Prior to Visit  ?Medication Sig Dispense Refill  ? famotidine (PEPCID) 20 MG tablet Take 1 tablet (20 mg total) by mouth 2 (two) times daily. 30 tablet 0  ? aspirin 325 MG EC tablet Take 325 mg by mouth every 6 (six) hours as needed for pain.    ? ?No current facility-administered medications on file prior to visit.  ? ?Review of System: ?Comprehensive ROS Pertinent positive and negative noted in HPI   ? ?Objective:  ?BP 118/84 (BP Location: Right Arm, Patient Position: Sitting, Cuff Size: Large)   Pulse 70   Temp 97.7 ?F (36.5 ?C) (Oral)   Ht 5\' 2"  (1.575 m)   Wt 212 lb (96.2 kg)   SpO2 94%   BMI 38.78 kg/m?  ? ? ?Physical Exam: ?General Appearance: Well nourished, obese female in no apparent distress. ?Eyes: PERRLA, EOMs, conjunctiva no swelling or erythema ?Sinuses: No Frontal/maxillary tenderness ?ENT/Mouth: Ext aud canals clear, TMs without erythema, bulging. Hearing normal.  ?Neck: Supple, thyroid normal.  ?Respiratory: Respiratory effort normal, BS equal bilaterally without rales, rhonchi, wheezing or stridor.  ?Cardio: RRR with no MRGs. Brisk peripheral pulses without edema.  ?Abdomen: Soft, + BS.  Non tender, no guarding, rebound, hernias, masses. ?Lymphatics: Non tender without lymphadenopathy.  ?Musculoskeletal: Full ROM, 5/5 strength, normal gait.  ?Skin: Warm, dry without rashes, lesions, ecchymosis.   ?Neuro: Cranial nerves intact. Normal muscle tone, no cerebellar symptoms. Sensation intact.  ?Psych: Awake and oriented X 3, normal affect, Insight and Judgment appropriate.  ? ? ?Assessment:  ?Indy was seen today for hospitalization follow-up and eye pain. ? ?Diagnoses and all orders for this visit: ?Lazaria was seen today for hospitalization follow-up and eye pain. ? ?Diagnoses and all orders for this visit: ? ?Hospital discharge follow-up ?Retrieved from d/c  ?Follow-Ups: Schedule an appointment with Gunnar Fusi, NP (Internal Medicine) completed  ? ?Hypertension, unspecified type ?BP goal - < 130/80 ?Explained that having normal blood pressure is the goal and medications are helping to get to goal and maintain normal blood pressure. ?DIET: Limit salt intake, read nutrition labels to check salt content, limit fried and high fatty foods  ?Avoid using multisymptom OTC cold preparations that generally contain sudafed which can rise BP. Consult with pharmacist on best cold relief products to use for persons with HTN ?EXERCISE ?Discussed incorporating exercise such as walking - 30 minutes most days of the week and can do in 10 minute intervals    ?-     lisinopril-hydrochlorothiazide (ZESTORETIC) 20-12.5 MG tablet; Take 1 tablet by mouth daily. ?-     amLODipine (NORVASC) 10 MG tablet; Take 1 tablet (10 mg total) by mouth daily. ? ?Hyperlipidemia, unspecified hyperlipidemia type ? Healthy lifestyle diet of fruits vegetables fish nuts whole grains and low saturated fat . Foods high in cholesterol or liver, fatty meats,cheese, butter avocados, nuts and seeds,  chocolate and fried foods. ?Will do lipid panel on Bp f/u ?  ?Pain in eye, left eye ? ? ? ?Referred to ophthalmology  ?Meds ordered this encounter  ?Medications  ? lisinopril-hydrochlorothiazide (ZESTORETIC) 20-12.5 MG tablet  ?  Sig: Take 1 tablet by mouth daily.  ?  Dispense:  90 tablet  ?  Refill:  1  ? amLODipine (NORVASC) 10 MG tablet  ?  Sig: Take 1  tablet (10 mg total) by mouth daily.  ?  Dispense:  90 tablet  ?  Refill:  1  ? ? ?This note has been created with Education officer, environmental. Any transcriptional errors are unintentional.  ? ?Grayce Sessions, NP ?01/12/2022, 1:45 PM ?  ? ?

## 2022-01-12 ENCOUNTER — Encounter (INDEPENDENT_AMBULATORY_CARE_PROVIDER_SITE_OTHER): Payer: Self-pay | Admitting: Primary Care

## 2022-01-12 ENCOUNTER — Other Ambulatory Visit (INDEPENDENT_AMBULATORY_CARE_PROVIDER_SITE_OTHER): Payer: Self-pay | Admitting: Primary Care

## 2022-01-12 DIAGNOSIS — H5710 Ocular pain, unspecified eye: Secondary | ICD-10-CM

## 2022-01-21 ENCOUNTER — Other Ambulatory Visit (INDEPENDENT_AMBULATORY_CARE_PROVIDER_SITE_OTHER): Payer: Self-pay | Admitting: Primary Care

## 2022-01-21 ENCOUNTER — Telehealth (INDEPENDENT_AMBULATORY_CARE_PROVIDER_SITE_OTHER): Payer: Self-pay

## 2022-01-21 ENCOUNTER — Ambulatory Visit (INDEPENDENT_AMBULATORY_CARE_PROVIDER_SITE_OTHER): Payer: Self-pay

## 2022-01-21 DIAGNOSIS — H5710 Ocular pain, unspecified eye: Secondary | ICD-10-CM

## 2022-01-21 MED ORDER — IBUPROFEN 600 MG PO TABS: 600.0000 mg | ORAL_TABLET | Freq: Three times a day (TID) | ORAL | 0 refills | Status: DC | PRN

## 2022-01-21 NOTE — Telephone Encounter (Signed)
?  Chief Complaint: Eye pain 8/10 ?Symptoms: Draining cyst on outside corner of left eye ?Frequency: 01/07/2022 ?Pertinent Negatives: Patient denies fever ?Disposition: [x] ED /[] Urgent Care (no appt availability in office) / [] Appointment(In office/virtual)/ []  [facility]/ [] Home Care/ [] Refused Recommended Disposition /[] [place] [facility]/ []  Follow-up with PCP ?Additional Notes: Pt rates pain of 8/10. Cyst on out side of left eye is draining.  ? ?Reason for Disposition ? SEVERE eye pain (e.g., excruciating) ? ?Answer Assessment - Initial Assessment Questions ?1. EYE DISCHARGE: "Is the discharge in one or both eyes?" "What color is it?" "How much is there?" "When did the discharge start?"  ?    Outside corner ?2. REDNESS OF SCLERA: "Is the redness in one or both eyes?" "When did the redness start?"  ?    *No Answer* ?3. EYELIDS: "Are the eyelids red or swollen?" If Yes, ask: "How much?"  ?    *No Answer* ?4. VISION: "Is there any difficulty seeing clearly?"  ?    A little ?5. PAIN: "Is there any pain? If Yes, ask: "How bad is it?" (Scale 1-10; or mild, moderate, severe) ?   - MILD (1-3): doesn't interfere with normal activities  ?   - MODERATE (4-7): interferes with normal activities or awakens from sleep ?   - SEVERE (8-10): excruciating pain, unable to do any normal activities   ?    *No Answer* ?6. CONTACT LENS: "Do you wear contacts?" ?    *No Answer* ?7. OTHER SYMPTOMS: "Do you have any other symptoms?" (e.g., fever, runny nose, cough) ?    *No Answer* ?8. PREGNANCY: "Is there any chance you are pregnant?" "When was your last menstrual period?" ?    na ? ?Protocols used: Eye - Pus or Junction ? ?

## 2022-01-21 NOTE — Telephone Encounter (Signed)
Copied from [place] [id number]. Topic: Referral - Status ?>> Jan 20, 2022  4:43 PM Yvette Rack wrote: ?Reason for CRM: Pt stated she was suppose to receive a referral to an eye doctor but she has not heard back from anyone. Pt also stated asked about the Rx for Ibuprofen for pain in her eye. Pt requests call back. ? ? ?Referral was never placed. It appears PCP put in an order not referral. No Ibuprofen was sent for pain. Please advise on both. Patient previously stated that per insurance referral can be sent to [facility] or [facility] . Nat Christen, CMA  ?

## 2022-01-21 NOTE — Telephone Encounter (Signed)
FYI. Encounter sent today about patient needing eye referral placed and ibuprofen for pain.  ?

## 2022-04-08 ENCOUNTER — Ambulatory Visit (INDEPENDENT_AMBULATORY_CARE_PROVIDER_SITE_OTHER): Payer: 59 | Admitting: Primary Care

## 2022-04-08 ENCOUNTER — Encounter (INDEPENDENT_AMBULATORY_CARE_PROVIDER_SITE_OTHER): Payer: Self-pay | Admitting: Primary Care

## 2022-04-08 VITALS — BP 134/91 | HR 75 | Temp 97.9°F | Ht 62.0 in | Wt 215.0 lb

## 2022-04-08 DIAGNOSIS — Z23 Encounter for immunization: Secondary | ICD-10-CM | POA: Diagnosis not present

## 2022-04-08 DIAGNOSIS — I10 Essential (primary) hypertension: Secondary | ICD-10-CM | POA: Diagnosis not present

## 2022-04-08 NOTE — Progress Notes (Signed)
[facility]   [name] is a 56 y.o. female presents for hypertension evaluation, Denies shortness of breath, headaches, chest pain or lower extremity edema, sudden onset, vision changes, unilateral weakness, dizziness, paresthesias   Patient reports adherence with medications.  Dietary habits include: sodium  Exercise habits include:walking  Family / Social history: None   Past Medical History:  Diagnosis Date   Hypertension    No past surgical history on file. No Known Allergies Current Outpatient Medications on File Prior to Visit  Medication Sig Dispense Refill   lisinopril-hydrochlorothiazide (ZESTORETIC) 20-12.5 MG tablet Take 1 tablet by mouth daily. 90 tablet 1   amLODipine (NORVASC) 10 MG tablet Take 1 tablet (10 mg total) by mouth daily. 90 tablet 1   aspirin 325 MG EC tablet Take 325 mg by mouth every 6 (six) hours as needed for pain.     famotidine (PEPCID) 20 MG tablet Take 1 tablet (20 mg total) by mouth 2 (two) times daily. 30 tablet 0   No current facility-administered medications on file prior to visit.   Social History   Socioeconomic History   Marital status: Single    Spouse name: Not on file   Number of children: Not on file   Years of education: Not on file   Highest education level: Not on file  Occupational History   Not on file  Tobacco Use   Smoking status: Some Days    Packs/day: 0.10    Types: Cigarettes   Smokeless tobacco: Never  Substance and Sexual Activity   Alcohol use: Yes    Comment: occasional   Drug use: Yes    Types: Cocaine    Comment: Pt states last use was 01/02/17.   Sexual activity: Yes    Birth control/protection: None  Other Topics Concern   Not on file  Social History Narrative   Not on file   Social Determinants of Health   Financial Resource Strain: Not on file  Food Insecurity: Not on file  Transportation Needs: Not on file  Physical Activity: Not on file  Stress: Not on file   Social Connections: Not on file  Intimate Partner Violence: Not on file   Family History  Problem Relation Age of Onset   Cancer Mother      OBJECTIVE:  Vitals:   04/08/22 1559 04/08/22 1613  BP: (!) 144/106 (!) 134/91  Pulse: 75   Temp: 97.9 F (36.6 C)   TempSrc: Oral   SpO2: 99%   Weight: 215 lb (97.5 kg)   Height: 5\' 2"  (1.575 m)     Physical Exam Vitals reviewed.  Constitutional:      Appearance: She is obese.  HENT:     Head: Normocephalic.     Right Ear: External ear normal.     Left Ear: External ear normal.     Nose: Nose normal.  Eyes:     Extraocular Movements: Extraocular movements intact.  Cardiovascular:     Rate and Rhythm: Normal rate and regular rhythm.  Pulmonary:     Effort: Pulmonary effort is normal.     Breath sounds: Normal breath sounds.  Abdominal:     General: Bowel sounds are normal. There is distension.     Palpations: Abdomen is soft.  Musculoskeletal:        General: Normal range of motion.     Cervical back: Normal range of motion and neck supple.  Skin:    General: Skin is warm and dry.  Neurological:     Mental Status: She is alert and oriented to person, place, and time.  Psychiatric:        Mood and Affect: Mood normal.        Behavior: Behavior normal.        Thought Content: Thought content normal.        Judgment: Judgment normal.    ROS  Last 3 Office BP readings: BP Readings from Last 3 Encounters:  04/08/22 (!) 134/91  01/07/22 118/84  12/06/21 (!) 165/103    BMET    Component Value Date/Time   NA 142 03/31/2018 1054   K 4.2 03/31/2018 1054   CL 105 03/31/2018 1054   CO2 22 03/31/2018 1054   GLUCOSE 100 (H) 03/31/2018 1054   GLUCOSE 107 (H) 01/05/2017 1901   BUN 14 03/31/2018 1054   CREATININE 0.85 03/31/2018 1054   CALCIUM 9.1 03/31/2018 1054   GFRNONAA 78 03/31/2018 1054   GFRAA 90 03/31/2018 1054    Renal function: CrCl cannot be calculated (Patient's most recent lab result is older than  the maximum 21 days allowed.).  Clinical ASCVD: No  The ASCVD Risk score (Arnett DK, et al., 2019) failed to calculate for the following reasons:   Cannot find a previous HDL lab   Cannot find a previous total cholesterol lab  ASCVD risk factors include- Sheri Beck   ASSESSMENT & PLAN: Sheri Beck was seen today for hypertension.  Diagnoses and all orders for this visit:  Need for shingles vaccine -     Varicella-zoster vaccine IM (Shingrix)  Hypertension, unspecified type -Counseled on lifestyle modifications for blood pressure control including reduced dietary sodium, increased exercise, weight reduction and adequate sleep. Also, educated patient about the risk for cardiovascular events, stroke and heart attack. Also counseled patient about the importance of medication adherence. If you participate in smoking, it is important to stop using tobacco as this will increase the risks associated with uncontrolled blood pressure.  Goal BP:  For patients younger than 60: Goal BP < 130/80. For patients 60 and older: Goal BP < 140/90. For patients with diabetes: Goal BP < 130/80. Your most recent BP: 134/91  Minimize salt intake. Minimize alcohol intake    This note has been created with Education officer, environmental. Any transcriptional errors are unintentional.   Grayce Sessions, NP 04/08/2022, 4:17 PM

## 2022-04-16 ENCOUNTER — Ambulatory Visit (INDEPENDENT_AMBULATORY_CARE_PROVIDER_SITE_OTHER): Payer: 59

## 2022-04-16 ENCOUNTER — Encounter (HOSPITAL_COMMUNITY): Payer: Self-pay

## 2022-04-16 ENCOUNTER — Ambulatory Visit (HOSPITAL_COMMUNITY)
Admission: EM | Admit: 2022-04-16 | Discharge: 2022-04-16 | Disposition: A | Discharge status: Home / Self Care | Payer: 59 | Attending: Family Medicine | Admitting: Family Medicine

## 2022-04-16 DIAGNOSIS — R030 Elevated blood-pressure reading, without diagnosis of hypertension: Secondary | ICD-10-CM | POA: Diagnosis not present

## 2022-04-16 DIAGNOSIS — M79645 Pain in left finger(s): Secondary | ICD-10-CM

## 2022-04-16 DIAGNOSIS — S6992XA Unspecified injury of left wrist, hand and finger(s), initial encounter: Secondary | ICD-10-CM

## 2022-04-16 MED ORDER — NAPROXEN 375 MG PO TABS: 375.0000 mg | ORAL_TABLET | Freq: Two times a day (BID) | ORAL | 0 refills | Status: DC

## 2022-04-16 NOTE — Discharge Instructions (Addendum)
X-ray showed no fracture.  Recommend wearing splint to reduce swelling. May remove splint at bedtime. I am placing you on Naprosyn twice daily for 5-7 days for pain and to reduce swelling.  If your symptoms worsen follow-up with [facility].

## 2022-04-16 NOTE — ED Triage Notes (Signed)
Pt states jammed her lt ring finger last night. Denies taking meds for pain.

## 2022-04-16 NOTE — ED Provider Notes (Signed)
[facility]    CSN: [id number] Arrival date & time: 04/16/22  2376      History   Chief Complaint Chief Complaint  Patient presents with   Finger Injury    HPI Sheri Beck is a 56 y.o. female.   HPI Patient presents today for evaluation of a finger injury.  She reports inadvertently jamming her left ring finger while doing housework yesterday.  She reports the swelling has worsened overnight and her finger is more painful and she is unable to fully extend or flex due to the swelling. She endorses full sensation. Denies any other complaints.  Her blood pressure is slightly elevated however she reports she just took her blood pressure medicine prior to arrival and she recently smoked a cigarette.  She was seen by her primary care provider on 04/08/2022 and had stable readings less than 140/80. Past Medical History:  Diagnosis Date   Hypertension     Patient Active Problem List   Diagnosis Date Noted   Acute bacterial conjunctivitis of right eye 05/06/2020   Dental abscess 05/06/2020   Essential hypertension 05/06/2020    History reviewed. No pertinent surgical history.  OB History   No obstetric history on file.      Home Medications    Prior to Admission medications   Medication Sig Start Date End Date Taking? Authorizing Provider  naproxen (NAPROSYN) 375 MG tablet Take 1 tablet (375 mg total) by mouth 2 (two) times daily. 04/16/22  Yes Bing Neighbors, FNP  amLODipine (NORVASC) 10 MG tablet Take 1 tablet (10 mg total) by mouth daily. 01/07/22   Grayce Sessions, NP  aspirin 325 MG EC tablet Take 325 mg by mouth every 6 (six) hours as needed for pain.    [provider]  famotidine (PEPCID) 20 MG tablet Take 1 tablet (20 mg total) by mouth 2 (two) times daily. 12/06/21   White, Elita Boone, NP  lisinopril-hydrochlorothiazide (ZESTORETIC) 20-12.5 MG tablet Take 1 tablet by mouth daily. 01/07/22   Grayce Sessions, NP    Family History Family  History  Problem Relation Age of Onset   Cancer Mother     Social History Social History   Tobacco Use   Smoking status: Some Days    Packs/day: 0.10    Types: Cigarettes   Smokeless tobacco: Never  Substance Use Topics   Alcohol use: Yes    Comment: occasional   Drug use: Yes    Types: Cocaine    Comment: Pt states last use was 01/02/17.     Allergies   Patient has no known allergies.   Review of Systems Review of Systems Pertinent negatives listed in HPI   Physical Exam Triage Vital Signs ED Triage Vitals  Enc Vitals Group     BP 04/16/22 1137 (!) 165/114     Pulse Rate 04/16/22 1137 70     Resp 04/16/22 1137 18     Temp 04/16/22 1137 98.6 F (37 C)     Temp Source 04/16/22 1137 Oral     SpO2 04/16/22 1137 97 %     Weight --      Height --      Head Circumference --      Peak Flow --      Pain Score 04/16/22 1138 8     Pain Loc --      Pain Edu? --      Excl. in GC? --    No data found.  Updated Vital Signs BP (!) 172/114 (BP Location: Right Arm)   Pulse 70   Temp 98.6 F (37 C) (Oral)   Resp 18   SpO2 97%   Visual Acuity Right Eye Distance:   Left Eye Distance:   Bilateral Distance:    Right Eye Near:   Left Eye Near:    Bilateral Near:     Physical Exam Constitutional:      Appearance: Normal appearance.  HENT:     Head: Normocephalic and atraumatic.  Cardiovascular:     Rate and Rhythm: Normal rate and regular rhythm.  Musculoskeletal:     Comments: Fourth left digit pronounced swelling at the PIP joint.  Decreased range of motion involving the fourth left digit.  Skin:    General: Skin is warm and dry.     Capillary Refill: Capillary refill takes less than 2 seconds.  Neurological:     General: No focal deficit present.     Mental Status: She is alert and oriented to person, place, and time.  Psychiatric:        Mood and Affect: Mood normal.        Behavior: Behavior normal.        Thought Content: Thought content normal.     UC Treatments / Results  Labs (all labs ordered are listed, but only abnormal results are displayed) Labs Reviewed - No data to display  EKG   Radiology DG Finger Ring Left  Result Date: 04/16/2022 CLINICAL DATA:  Trauma, pain EXAM: LEFT RING FINGER 2+V COMPARISON:  None Available. FINDINGS: No fracture or dislocation is seen. There is soft tissue swelling with PIP joint. There are no opaque foreign bodies. IMPRESSION: No recent fracture or dislocation is seen in left ring finger. Electronically Signed   By: Ernie Avena M.D.   On: 04/16/2022 12:08    Procedures Procedures (including critical care time)  Medications Ordered in UC Medications - No data to display  Initial Impression / Assessment and Plan / UC Course  I have reviewed the triage vital signs and the nursing notes.  Pertinent labs & imaging results that were available during my care of the patient were reviewed by me and considered in my medical decision making (see chart for details).  Clinical Course as of 04/16/22 1337  Fri Apr 16, 2022  1141 BP(!): 165/114 [KH]    Clinical Course User Index [KH] Bing Neighbors, FNP     Left finger injury imaging negative for any acute fracture.  Finger splinted for comfort.  Advised to take naproxen twice daily over the next 5-7 to reduce inflammation, pain and swelling.  May discontinue splint at personal discretion. Follow-up with Ortho care if symptoms worsen or do not improve. Recheck of BP remains elevated-asymptomatic.  Reassuring that patient is closely followed by her primary care and has had a recent hypertension follow-up with BP reading less than 140/70. Follow-up with PCP and take medications as prescribed.  Final diagnoses:  Finger injury, left, initial encounter     Discharge Instructions      X-ray showed no fracture.  Recommend wearing splint to reduce swelling. May remove splint at bedtime. I am placing you on Naprosyn twice daily for 5-7  days for pain and to reduce swelling.  If your symptoms worsen follow-up with [facility].     ED Prescriptions     Medication Sig Dispense Auth. Provider   naproxen (NAPROSYN) 375 MG tablet Take 1 tablet (375 mg total) by mouth  2 (two) times daily. 20 tablet Bing Neighbors, FNP      PDMP not reviewed this encounter.   Bing Neighbors, FNP 04/16/22 1344

## 2022-07-12 ENCOUNTER — Telehealth (INDEPENDENT_AMBULATORY_CARE_PROVIDER_SITE_OTHER): Payer: Commercial Managed Care - HMO | Admitting: Primary Care

## 2022-07-27 ENCOUNTER — Ambulatory Visit (INDEPENDENT_AMBULATORY_CARE_PROVIDER_SITE_OTHER): Payer: Commercial Managed Care - HMO | Admitting: Primary Care

## 2022-08-20 ENCOUNTER — Other Ambulatory Visit (INDEPENDENT_AMBULATORY_CARE_PROVIDER_SITE_OTHER): Payer: Self-pay | Admitting: Primary Care

## 2022-08-20 DIAGNOSIS — H5710 Ocular pain, unspecified eye: Secondary | ICD-10-CM

## 2022-08-20 NOTE — Telephone Encounter (Signed)
Discontinued 04/08/2022 - not on med list. Requested Prescriptions  Pending Prescriptions Disp Refills   ibuprofen (ADVIL) 600 MG tablet [Pharmacy Med Name: Ibuprofen 600 MG Oral Tablet] 90 tablet 0    Sig: TAKE 1 TABLET BY MOUTH EVERY 8 HOURS AS NEEDED     Analgesics:  NSAIDS Failed - 08/20/2022  3:22 PM      Failed - Manual Review: Labs are only required if the patient has taken medication for more than 8 weeks.      Failed - Cr in normal range and within 360 days    Creatinine, Ser  Date Value Ref Range Status  03/31/2018 0.85 0.57 - 1.00 mg/dL Final         Failed - HGB in normal range and within 360 days    Hemoglobin  Date Value Ref Range Status  09/12/2020 15.0 11.1 - 15.9 g/dL Final         Failed - PLT in normal range and within 360 days    Platelets  Date Value Ref Range Status  09/12/2020 289 150 - 450 x10E3/uL Final         Failed - HCT in normal range and within 360 days    Hematocrit  Date Value Ref Range Status  09/12/2020 44.6 34.0 - 46.6 % Final         Failed - eGFR is 30 or above and within 360 days    GFR calc Af Amer  Date Value Ref Range Status  03/31/2018 90 >59 mL/min/1.73 Final   GFR calc non Af Amer  Date Value Ref Range Status  03/31/2018 78 >59 mL/min/1.73 Final         Passed - Patient is not pregnant      Passed - Valid encounter within last 12 months    Recent Outpatient Visits           4 months ago Need for shingles vaccine   [facility] Edwards, Michelle P, NP   7 months ago Hospital discharge follow-up   [facility] Edwards, Michelle P, NP   1 year ago Discharge from both nipples   [facility] Edwards, Michelle P, NP   2 years ago Cervical cancer screening   [facility] Edwards, Michelle P, NP   2 years ago Poor dental hygiene   [facility] Edwards, Michelle P, NP                

## 2022-09-13 ENCOUNTER — Ambulatory Visit: Payer: Commercial Managed Care - HMO | Admitting: Family Medicine

## 2022-09-23 ENCOUNTER — Encounter: Payer: Self-pay | Admitting: Family Medicine

## 2022-09-23 ENCOUNTER — Ambulatory Visit (INDEPENDENT_AMBULATORY_CARE_PROVIDER_SITE_OTHER): Payer: Managed Care, Other (non HMO) | Admitting: Family Medicine

## 2022-09-23 VITALS — BP 112/80 | HR 70 | Temp 98.2°F | Ht 62.0 in | Wt 212.5 lb

## 2022-09-23 DIAGNOSIS — Z6838 Body mass index (BMI) 38.0-38.9, adult: Secondary | ICD-10-CM

## 2022-09-23 DIAGNOSIS — Z23 Encounter for immunization: Secondary | ICD-10-CM | POA: Diagnosis not present

## 2022-09-23 DIAGNOSIS — I10 Essential (primary) hypertension: Secondary | ICD-10-CM | POA: Diagnosis not present

## 2022-09-23 DIAGNOSIS — E785 Hyperlipidemia, unspecified: Secondary | ICD-10-CM

## 2022-09-23 DIAGNOSIS — Z1211 Encounter for screening for malignant neoplasm of colon: Secondary | ICD-10-CM

## 2022-09-23 DIAGNOSIS — Z1231 Encounter for screening mammogram for malignant neoplasm of breast: Secondary | ICD-10-CM

## 2022-09-23 DIAGNOSIS — B379 Candidiasis, unspecified: Secondary | ICD-10-CM

## 2022-09-23 MED ORDER — FLUCONAZOLE 150 MG PO TABS: 150.0000 mg | ORAL_TABLET | ORAL | 0 refills | Status: AC

## 2022-09-23 NOTE — Progress Notes (Signed)
New Patient Office Visit  Subjective    Patient ID: Sheri Beck, female    DOB: [birth year]  Age: 56 y.o. MRN: [id number]  CC:  Chief Complaint  Patient presents with   Establish Care    HPI Sheri Beck presents to establish care Previous PCP was Gwinda Passe. Patient is reporting a new symptom of a sharp pain under her left breast, states that it happens only at nighttime, pt states she sleeps on that side. Reports that it does not happen during the day. States it only happened one time and it went away, hasn't recurred. Did not hurt to touch. States she had some mild SOB,  pt states the pain traveled into her left shoulder, no acid reflux or indigestion, states it happened in the early morning, only lasted for about 5-10 minutes.   Pt is also reporting that she is having some tingly sensation in her genital area. States that when she urinates and the fluid touches the skin it causes a burning sensation, states that the skin looks a little red, it is tender to touch. No usual vaginal discharge, no fever or chills. Pt reports urination is normal, no pressure or increased frequency.   I extensively reviewed her health maintenance measures and we discussed these at length. Pt has recently gotten insurance coverage and is behind on her HM. Orders placed for colonoscopy referral and mammogram.  Outpatient Encounter Medications as of 09/23/2022  Medication Sig   amLODipine (NORVASC) 10 MG tablet Take 1 tablet (10 mg total) by mouth daily.   aspirin 325 MG EC tablet Take 325 mg by mouth every 6 (six) hours as needed for pain.   [EXPIRED] fluconazole (DIFLUCAN) 150 MG tablet Take 1 tablet (150 mg total) by mouth every 3 (three) days for 2 doses.   lisinopril-hydrochlorothiazide (ZESTORETIC) 20-12.5 MG tablet Take 1 tablet by mouth daily.   naproxen (NAPROSYN) 375 MG tablet Take 1 tablet (375 mg total) by mouth 2 (two) times daily.   [DISCONTINUED] famotidine (PEPCID) 20 MG tablet Take 1  tablet (20 mg total) by mouth 2 (two) times daily.   No facility-administered encounter medications on file as of 09/23/2022.    Past Medical History:  Diagnosis Date   Hypertension     History reviewed. No pertinent surgical history.  Family History  Problem Relation Age of Onset   Cancer Mother    Heart disease Father    Glaucoma Father    Varicose Veins Paternal Grandfather     Social History   Socioeconomic History   Marital status: Single    Spouse name: Not on file   Number of children: Not on file   Years of education: Not on file   Highest education level: Not on file  Occupational History   Not on file  Tobacco Use   Smoking status: Some Days    Packs/day: 0.10    Types: Cigarettes   Smokeless tobacco: Never  Vaping Use   Vaping Use: Never used  Substance and Sexual Activity   Alcohol use: Yes    Comment: occasional   Drug use: Yes    Types: Cocaine    Comment: Pt states last use was 01/02/17.   Sexual activity: Yes    Birth control/protection: None  Other Topics Concern   Not on file  Social History Narrative   Not on file   Social Determinants of Health   Financial Resource Strain: Not on file  Food Insecurity: Not on file  Transportation Needs: Not on file  Physical Activity: Not on file  Stress: Not on file  Social Connections: Not on file  Intimate Partner Violence: Not on file    Review of Systems  All other systems reviewed and are negative.       Objective    BP 112/80 (BP Location: Left Arm, Patient Position: Sitting, Cuff Size: Large)   Pulse 70   Temp 98.2 F (36.8 C) (Oral)   Ht 5\' 2"  (1.575 m)   Wt 212 lb 8 oz (96.4 kg)   SpO2 96%   BMI 38.87 kg/m   Physical Exam Vitals reviewed.  Constitutional:      Appearance: Normal appearance. She is well-groomed. She is obese.  Eyes:     Conjunctiva/sclera: Conjunctivae normal.  Neck:     Thyroid: No thyromegaly.  Cardiovascular:     Rate and Rhythm: Normal rate and  regular rhythm.     Pulses: Normal pulses.     Heart sounds: S1 normal and S2 normal.  Pulmonary:     Effort: Pulmonary effort is normal.     Breath sounds: Normal breath sounds and air entry.  Abdominal:     General: Bowel sounds are normal.  Musculoskeletal:     Right lower leg: No edema.     Left lower leg: No edema.  Neurological:     Mental Status: She is alert and oriented to person, place, and time. Mental status is at baseline.     Gait: Gait is intact.  Psychiatric:        Mood and Affect: Mood and affect normal.        Speech: Speech normal.        Behavior: Behavior normal.        Judgment: Judgment normal.         Assessment & Plan:   Problem List Items Addressed This Visit       Unprioritized   Essential hypertension - Primary    Current hypertension medications:       Sig   amLODipine (NORVASC) 10 MG tablet (Taking) Take 1 tablet (10 mg total) by mouth daily.   lisinopril-hydrochlorothiazide (ZESTORETIC) 20-12.5 MG tablet (Taking) Take 1 tablet by mouth daily.     BP is well controlled on the above medications, will continue as prescribed.       Relevant Orders   CMP   Hyperlipidemia (Chronic)    Patient needs new lipid panel done, orders placed.       Relevant Orders   Lipid Panel   Other Visit Diagnoses     Class 2 severe obesity with serious comorbidity and body mass index (BMI) of 38.0 to 38.9 in adult, unspecified obesity type [facility])       Relevant Orders   Patient expressed an interest in losing weight. We discussed this a bit and I have placed orders for TSH and A1C to rule out endocrine disease causing obesity.   Hemoglobin A1c   TSH   Encounter for screening mammogram for malignant neoplasm of breast       Relevant Orders   MM Digital Screening   Colon cancer screening       Relevant Orders   Ambulatory referral to Gastroenterology   Immunization due       Relevant Orders   Flu Vaccine QUAD 6+ mos PF IM (Fluarix Quad PF)  (Completed)   Yeast infection     Per patient's report. She will be due in Sept 2024 for  her pap smear. I recommended that we treat empirically with diflucan 150 mg 1 tablet every 72 hours to see if her symptoms resolve. She may also use A and D ointment for skin protection.   Need for immunization against influenza           Return in about 6 months (around 03/25/2023).   Karie Georges, MD

## 2022-09-28 ENCOUNTER — Other Ambulatory Visit: Payer: Medicaid Other

## 2022-09-28 DIAGNOSIS — E785 Hyperlipidemia, unspecified: Secondary | ICD-10-CM | POA: Insufficient documentation

## 2022-09-28 NOTE — Assessment & Plan Note (Signed)
Patient needs new lipid panel done, orders placed.

## 2022-09-28 NOTE — Assessment & Plan Note (Signed)
Current hypertension medications:       Sig   amLODipine (NORVASC) 10 MG tablet (Taking) Take 1 tablet (10 mg total) by mouth daily.   lisinopril-hydrochlorothiazide (ZESTORETIC) 20-12.5 MG tablet (Taking) Take 1 tablet by mouth daily.      BP is well controlled on the above medications, will continue as prescribed.

## 2022-10-01 ENCOUNTER — Ambulatory Visit
Admission: RE | Admit: 2022-10-01 | Discharge: 2022-10-01 | Disposition: A | Discharge status: Home / Self Care | Payer: Medicaid Other | Source: Ambulatory Visit | Attending: Family Medicine | Admitting: Family Medicine

## 2022-10-01 ENCOUNTER — Other Ambulatory Visit: Payer: Self-pay | Admitting: Family Medicine

## 2022-10-01 DIAGNOSIS — B379 Candidiasis, unspecified: Secondary | ICD-10-CM

## 2022-10-01 DIAGNOSIS — Z1211 Encounter for screening for malignant neoplasm of colon: Secondary | ICD-10-CM

## 2022-10-01 DIAGNOSIS — Z23 Encounter for immunization: Secondary | ICD-10-CM

## 2022-10-01 DIAGNOSIS — I10 Essential (primary) hypertension: Secondary | ICD-10-CM

## 2022-10-01 DIAGNOSIS — Z1231 Encounter for screening mammogram for malignant neoplasm of breast: Secondary | ICD-10-CM

## 2022-10-01 DIAGNOSIS — E785 Hyperlipidemia, unspecified: Secondary | ICD-10-CM

## 2022-10-02 ENCOUNTER — Other Ambulatory Visit (INDEPENDENT_AMBULATORY_CARE_PROVIDER_SITE_OTHER): Payer: Self-pay | Admitting: Primary Care

## 2022-10-02 DIAGNOSIS — E785 Hyperlipidemia, unspecified: Secondary | ICD-10-CM

## 2022-10-02 DIAGNOSIS — I10 Essential (primary) hypertension: Secondary | ICD-10-CM

## 2022-10-04 DIAGNOSIS — I639 Cerebral infarction, unspecified: Secondary | ICD-10-CM

## 2022-10-04 HISTORY — DX: Cerebral infarction, unspecified: I63.9

## 2022-10-05 ENCOUNTER — Other Ambulatory Visit (INDEPENDENT_AMBULATORY_CARE_PROVIDER_SITE_OTHER): Payer: BLUE CROSS/BLUE SHIELD

## 2022-10-05 DIAGNOSIS — Z6838 Body mass index (BMI) 38.0-38.9, adult: Secondary | ICD-10-CM | POA: Diagnosis not present

## 2022-10-05 DIAGNOSIS — E785 Hyperlipidemia, unspecified: Secondary | ICD-10-CM | POA: Diagnosis not present

## 2022-10-05 DIAGNOSIS — I10 Essential (primary) hypertension: Secondary | ICD-10-CM

## 2022-10-05 LAB — HEMOGLOBIN A1C: Hgb A1c MFr Bld: 6.3 % (ref 4.6–6.5)

## 2022-10-05 LAB — COMPREHENSIVE METABOLIC PANEL
ALT: 25 U/L (ref 0–35)
AST: 20 U/L (ref 0–37)
Albumin: 4.1 g/dL (ref 3.5–5.2)
Alkaline Phosphatase: 72 U/L (ref 39–117)
BUN: 24 mg/dL — ABNORMAL HIGH (ref 6–23)
CO2: 27 mEq/L (ref 19–32)
Calcium: 9.7 mg/dL (ref 8.4–10.5)
Chloride: 106 mEq/L (ref 96–112)
Creatinine, Ser: 0.91 mg/dL (ref 0.40–1.20)
GFR: 69.35 mL/min (ref >60.00)
Glucose, Bld: 107 mg/dL — ABNORMAL HIGH (ref 70–99)
Potassium: 4 mEq/L (ref 3.5–5.1)
Sodium: 141 mEq/L (ref 135–145)
Total Bilirubin: 0.3 mg/dL (ref 0.2–1.2)
Total Protein: 6.9 g/dL (ref 6.0–8.3)

## 2022-10-05 LAB — TSH: TSH: 3.53 u[IU]/mL (ref 0.35–5.50)

## 2022-10-05 LAB — LIPID PANEL
Cholesterol: 225 mg/dL — ABNORMAL HIGH (ref 0–200)
HDL: 54.8 mg/dL (ref >39.00)
LDL Cholesterol: 151 mg/dL — ABNORMAL HIGH (ref 0–99)
NonHDL: 169.89
Total CHOL/HDL Ratio: 4
Triglycerides: 96 mg/dL (ref 0.0–149.0)
VLDL: 19.2 mg/dL (ref 0.0–40.0)

## 2022-10-06 NOTE — Progress Notes (Signed)
Normal mammo, repeat yearly

## 2022-10-16 ENCOUNTER — Other Ambulatory Visit: Payer: Self-pay

## 2022-10-16 ENCOUNTER — Inpatient Hospital Stay (HOSPITAL_COMMUNITY): Payer: Medicaid Other | Admitting: Certified Registered Nurse Anesthetist

## 2022-10-16 ENCOUNTER — Emergency Department (HOSPITAL_COMMUNITY): Payer: Medicaid Other

## 2022-10-16 ENCOUNTER — Inpatient Hospital Stay (HOSPITAL_COMMUNITY)
Admission: EM | Admit: 2022-10-16 | Discharge: 2022-10-23 | DRG: 023 | Disposition: A | Discharge status: Skilled Nursing Facility | Payer: Medicaid Other | Attending: Neurosurgery | Admitting: Neurosurgery

## 2022-10-16 ENCOUNTER — Encounter (HOSPITAL_COMMUNITY)
Admission: EM | Disposition: A | Discharge status: Skilled Nursing Facility | Payer: Self-pay | Source: Home / Self Care | Attending: Neurosurgery

## 2022-10-16 ENCOUNTER — Encounter (HOSPITAL_COMMUNITY): Payer: Self-pay | Admitting: Pulmonary Disease

## 2022-10-16 DIAGNOSIS — I63311 Cerebral infarction due to thrombosis of right middle cerebral artery: Secondary | ICD-10-CM | POA: Diagnosis not present

## 2022-10-16 DIAGNOSIS — I63511 Cerebral infarction due to unspecified occlusion or stenosis of right middle cerebral artery: Secondary | ICD-10-CM | POA: Diagnosis not present

## 2022-10-16 DIAGNOSIS — I639 Cerebral infarction, unspecified: Secondary | ICD-10-CM | POA: Diagnosis present

## 2022-10-16 DIAGNOSIS — Z79899 Other long term (current) drug therapy: Secondary | ICD-10-CM

## 2022-10-16 DIAGNOSIS — G936 Cerebral edema: Secondary | ICD-10-CM

## 2022-10-16 DIAGNOSIS — F1420 Cocaine dependence, uncomplicated: Secondary | ICD-10-CM | POA: Insufficient documentation

## 2022-10-16 DIAGNOSIS — I10 Essential (primary) hypertension: Secondary | ICD-10-CM | POA: Diagnosis not present

## 2022-10-16 DIAGNOSIS — I82462 Acute embolism and thrombosis of left calf muscular vein: Secondary | ICD-10-CM | POA: Diagnosis present

## 2022-10-16 DIAGNOSIS — F05 Delirium due to known physiological condition: Secondary | ICD-10-CM | POA: Diagnosis present

## 2022-10-16 DIAGNOSIS — R414 Neurologic neglect syndrome: Secondary | ICD-10-CM | POA: Diagnosis present

## 2022-10-16 DIAGNOSIS — I6389 Other cerebral infarction: Secondary | ICD-10-CM | POA: Diagnosis not present

## 2022-10-16 DIAGNOSIS — F320 Major depressive disorder, single episode, mild: Secondary | ICD-10-CM | POA: Diagnosis present

## 2022-10-16 DIAGNOSIS — Z6841 Body Mass Index (BMI) 40.0 and over, adult: Secondary | ICD-10-CM

## 2022-10-16 DIAGNOSIS — R400 Somnolence: Secondary | ICD-10-CM | POA: Diagnosis present

## 2022-10-16 DIAGNOSIS — Z7982 Long term (current) use of aspirin: Secondary | ICD-10-CM

## 2022-10-16 DIAGNOSIS — E870 Hyperosmolality and hypernatremia: Secondary | ICD-10-CM | POA: Diagnosis present

## 2022-10-16 DIAGNOSIS — R29810 Facial weakness: Secondary | ICD-10-CM | POA: Diagnosis present

## 2022-10-16 DIAGNOSIS — G8194 Hemiplegia, unspecified affecting left nondominant side: Secondary | ICD-10-CM | POA: Diagnosis present

## 2022-10-16 DIAGNOSIS — Z8249 Family history of ischemic heart disease and other diseases of the circulatory system: Secondary | ICD-10-CM

## 2022-10-16 DIAGNOSIS — I63031 Cerebral infarction due to thrombosis of right carotid artery: Secondary | ICD-10-CM | POA: Diagnosis not present

## 2022-10-16 DIAGNOSIS — F17210 Nicotine dependence, cigarettes, uncomplicated: Secondary | ICD-10-CM | POA: Diagnosis present

## 2022-10-16 DIAGNOSIS — Z720 Tobacco use: Secondary | ICD-10-CM | POA: Insufficient documentation

## 2022-10-16 DIAGNOSIS — F1290 Cannabis use, unspecified, uncomplicated: Secondary | ICD-10-CM | POA: Insufficient documentation

## 2022-10-16 DIAGNOSIS — Z809 Family history of malignant neoplasm, unspecified: Secondary | ICD-10-CM

## 2022-10-16 DIAGNOSIS — Z83511 Family history of glaucoma: Secondary | ICD-10-CM

## 2022-10-16 DIAGNOSIS — E669 Obesity, unspecified: Secondary | ICD-10-CM | POA: Diagnosis present

## 2022-10-16 DIAGNOSIS — R7303 Prediabetes: Secondary | ICD-10-CM | POA: Diagnosis present

## 2022-10-16 DIAGNOSIS — R0683 Snoring: Secondary | ICD-10-CM | POA: Insufficient documentation

## 2022-10-16 DIAGNOSIS — Z789 Other specified health status: Secondary | ICD-10-CM | POA: Insufficient documentation

## 2022-10-16 DIAGNOSIS — R45851 Suicidal ideations: Secondary | ICD-10-CM | POA: Diagnosis present

## 2022-10-16 DIAGNOSIS — F1090 Alcohol use, unspecified, uncomplicated: Secondary | ICD-10-CM | POA: Insufficient documentation

## 2022-10-16 DIAGNOSIS — I82452 Acute embolism and thrombosis of left peroneal vein: Secondary | ICD-10-CM | POA: Diagnosis present

## 2022-10-16 DIAGNOSIS — I82432 Acute embolism and thrombosis of left popliteal vein: Secondary | ICD-10-CM | POA: Diagnosis present

## 2022-10-16 DIAGNOSIS — I6300 Cerebral infarction due to thrombosis of unspecified precerebral artery: Secondary | ICD-10-CM | POA: Diagnosis not present

## 2022-10-16 DIAGNOSIS — F19239 Other psychoactive substance dependence with withdrawal, unspecified: Secondary | ICD-10-CM | POA: Diagnosis present

## 2022-10-16 DIAGNOSIS — G935 Compression of brain: Secondary | ICD-10-CM | POA: Diagnosis present

## 2022-10-16 DIAGNOSIS — F4321 Adjustment disorder with depressed mood: Secondary | ICD-10-CM | POA: Diagnosis present

## 2022-10-16 DIAGNOSIS — E875 Hyperkalemia: Secondary | ICD-10-CM | POA: Diagnosis present

## 2022-10-16 DIAGNOSIS — R29720 NIHSS score 20: Secondary | ICD-10-CM | POA: Diagnosis present

## 2022-10-16 DIAGNOSIS — I63411 Cerebral infarction due to embolism of right middle cerebral artery: Secondary | ICD-10-CM | POA: Diagnosis not present

## 2022-10-16 DIAGNOSIS — D72828 Other elevated white blood cell count: Secondary | ICD-10-CM | POA: Diagnosis present

## 2022-10-16 HISTORY — PX: CRANIOTOMY: SHX93

## 2022-10-16 LAB — I-STAT CHEM 8, ED
BUN: 42 mg/dL — ABNORMAL HIGH (ref 6–20)
BUN: 29 mg/dL — ABNORMAL HIGH (ref 6–20)
Calcium, Ion: 1 mmol/L — ABNORMAL LOW (ref 1.15–1.40)
Calcium, Ion: 0.93 mmol/L — ABNORMAL LOW (ref 1.15–1.40)
Chloride: 109 mmol/L (ref 98–111)
Chloride: 113 mmol/L — ABNORMAL HIGH (ref 98–111)
Creatinine, Ser: 0.9 mg/dL (ref 0.44–1.00)
Creatinine, Ser: 0.9 mg/dL (ref 0.44–1.00)
Glucose, Bld: 161 mg/dL — ABNORMAL HIGH (ref 70–99)
Glucose, Bld: 138 mg/dL — ABNORMAL HIGH (ref 70–99)
HCT: 51 % — ABNORMAL HIGH (ref 36.0–46.0)
HCT: 53 % — ABNORMAL HIGH (ref 36.0–46.0)
Hemoglobin: 17.3 g/dL — ABNORMAL HIGH (ref 12.0–15.0)
Hemoglobin: 18 g/dL — ABNORMAL HIGH (ref 12.0–15.0)
Potassium: 7 mmol/L (ref 3.5–5.1)
Potassium: 3.8 mmol/L (ref 3.5–5.1)
Sodium: 138 mmol/L (ref 135–145)
Sodium: 141 mmol/L (ref 135–145)
TCO2: 22 mmol/L (ref 22–32)
TCO2: 16 mmol/L — ABNORMAL LOW (ref 22–32)

## 2022-10-16 LAB — MRSA NEXT GEN BY PCR, NASAL: MRSA by PCR Next Gen: NOT DETECTED

## 2022-10-16 LAB — GLUCOSE, CAPILLARY
Glucose-Capillary: 155 mg/dL — ABNORMAL HIGH (ref 70–99)
Glucose-Capillary: 163 mg/dL — ABNORMAL HIGH (ref 70–99)

## 2022-10-16 LAB — DIFFERENTIAL
Abs Immature Granulocytes: 0.07 10*3/uL (ref 0.00–0.07)
Basophils Absolute: 0.1 10*3/uL (ref 0.0–0.1)
Basophils Relative: 0 %
Eosinophils Absolute: 0 10*3/uL (ref 0.0–0.5)
Eosinophils Relative: 0 %
Immature Granulocytes: 1 %
Lymphocytes Relative: 15 %
Lymphs Abs: 1.7 10*3/uL (ref 0.7–4.0)
Monocytes Absolute: 0.3 10*3/uL (ref 0.1–1.0)
Monocytes Relative: 3 %
Neutro Abs: 9.1 10*3/uL — ABNORMAL HIGH (ref 1.7–7.7)
Neutrophils Relative %: 81 %

## 2022-10-16 LAB — COMPREHENSIVE METABOLIC PANEL
ALT: 23 U/L (ref 0–44)
AST: 25 U/L (ref 15–41)
Albumin: 3.8 g/dL (ref 3.5–5.0)
Alkaline Phosphatase: 57 U/L (ref 38–126)
Anion gap: 11 (ref 5–15)
BUN: 27 mg/dL — ABNORMAL HIGH (ref 6–20)
CO2: 23 mmol/L (ref 22–32)
Calcium: 8.9 mg/dL (ref 8.9–10.3)
Chloride: 105 mmol/L (ref 98–111)
Creatinine, Ser: 0.97 mg/dL (ref 0.44–1.00)
GFR, Estimated: >60 mL/min (ref >60)
Glucose, Bld: 118 mg/dL — ABNORMAL HIGH (ref 70–99)
Potassium: 4.7 mmol/L (ref 3.5–5.1)
Sodium: 139 mmol/L (ref 135–145)
Total Bilirubin: 0.7 mg/dL (ref 0.3–1.2)
Total Protein: 6.7 g/dL (ref 6.5–8.1)

## 2022-10-16 LAB — URINALYSIS, ROUTINE W REFLEX MICROSCOPIC
Bilirubin Urine: NEGATIVE
Glucose, UA: NEGATIVE mg/dL
Hgb urine dipstick: NEGATIVE
Ketones, ur: 5 mg/dL — AB
Leukocytes,Ua: NEGATIVE
Nitrite: NEGATIVE
Protein, ur: NEGATIVE mg/dL
Specific Gravity, Urine: 1.018 (ref 1.005–1.030)
pH: 5 (ref 5.0–8.0)

## 2022-10-16 LAB — RAPID URINE DRUG SCREEN, HOSP PERFORMED
Amphetamines: NOT DETECTED
Barbiturates: NOT DETECTED
Benzodiazepines: NOT DETECTED
Cocaine: POSITIVE — AB
Opiates: NOT DETECTED
Tetrahydrocannabinol: NOT DETECTED

## 2022-10-16 LAB — TYPE AND SCREEN
ABO/RH(D): B POS
Antibody Screen: NEGATIVE

## 2022-10-16 LAB — I-STAT BETA HCG BLOOD, ED (MC, WL, AP ONLY): I-stat hCG, quantitative: <5 m[IU]/mL (ref <5)

## 2022-10-16 LAB — PROTIME-INR
INR: 1 (ref 0.8–1.2)
Prothrombin Time: 13 seconds (ref 11.4–15.2)

## 2022-10-16 LAB — CBC
HCT: 51.3 % — ABNORMAL HIGH (ref 36.0–46.0)
Hemoglobin: 17.1 g/dL — ABNORMAL HIGH (ref 12.0–15.0)
MCH: 29.7 pg (ref 26.0–34.0)
MCHC: 33.3 g/dL (ref 30.0–36.0)
MCV: 89.1 fL (ref 80.0–100.0)
Platelets: 245 10*3/uL (ref 150–400)
RBC: 5.76 MIL/uL — ABNORMAL HIGH (ref 3.87–5.11)
RDW: 14.2 % (ref 11.5–15.5)
WBC: 11.3 10*3/uL — ABNORMAL HIGH (ref 4.0–10.5)
nRBC: 0 % (ref 0.0–0.2)

## 2022-10-16 LAB — SODIUM
Sodium: 139 mmol/L (ref 135–145)
Sodium: 145 mmol/L (ref 135–145)

## 2022-10-16 LAB — APTT: aPTT: 22 seconds — ABNORMAL LOW (ref 24–36)

## 2022-10-16 LAB — ABO/RH: ABO/RH(D): B POS

## 2022-10-16 LAB — MAGNESIUM: Magnesium: 2.3 mg/dL (ref 1.7–2.4)

## 2022-10-16 LAB — ETHANOL: Alcohol, Ethyl (B): <10 mg/dL (ref <10)

## 2022-10-16 SURGERY — CRANIOTOMY HEMATOMA EVACUATION SUBDURAL
Anesthesia: General | Site: Head | Laterality: Right

## 2022-10-16 MED ORDER — LACTATED RINGERS IV SOLN: INTRAVENOUS | Status: DC

## 2022-10-16 MED ORDER — MORPHINE SULFATE (PF) 2 MG/ML IV SOLN: 2.0000 mg | INTRAVENOUS | Status: DC | PRN

## 2022-10-16 MED ORDER — BACITRACIN ZINC 500 UNIT/GM EX OINT
TOPICAL_OINTMENT | CUTANEOUS | Status: DC | PRN
  Administered 2022-10-16: 1 via TOPICAL

## 2022-10-16 MED ORDER — CHLORHEXIDINE GLUCONATE CLOTH 2 % EX PADS
6.0000 | MEDICATED_PAD | Freq: Every day | CUTANEOUS | Status: DC
  Administered 2022-10-16 – 2022-10-23 (×8): 6 via TOPICAL

## 2022-10-16 MED ORDER — HYDRALAZINE HCL 20 MG/ML IJ SOLN: 10.0000 mg | INTRAMUSCULAR | Status: DC | PRN

## 2022-10-16 MED ORDER — ONDANSETRON HCL 4 MG/2ML IJ SOLN: 4.0000 mg | INTRAMUSCULAR | Status: DC | PRN

## 2022-10-16 MED ORDER — HYDROCHLOROTHIAZIDE 12.5 MG PO TABS: 12.5000 mg | ORAL_TABLET | Freq: Every day | ORAL | Status: DC

## 2022-10-16 MED ORDER — PROPOFOL 10 MG/ML IV BOLUS
INTRAVENOUS | Status: DC | PRN
  Administered 2022-10-16: 100 mg via INTRAVENOUS
  Administered 2022-10-16: 80 mg via INTRAVENOUS

## 2022-10-16 MED ORDER — 0.9 % SODIUM CHLORIDE (POUR BTL) OPTIME
TOPICAL | Status: DC | PRN
  Administered 2022-10-16 (×2): 1000 mL

## 2022-10-16 MED ORDER — CEFAZOLIN SODIUM-DEXTROSE 2-4 GM/100ML-% IV SOLN
2.0000 g | Freq: Three times a day (TID) | INTRAVENOUS | Status: AC
  Administered 2022-10-16 – 2022-10-17 (×2): 2 g via INTRAVENOUS
  Filled 2022-10-16 (×2): qty 100

## 2022-10-16 MED ORDER — ONDANSETRON HCL 4 MG/2ML IJ SOLN: 4.0000 mg | Freq: Once | INTRAMUSCULAR | Status: DC | PRN

## 2022-10-16 MED ORDER — PROMETHAZINE HCL 25 MG PO TABS: 12.5000 mg | ORAL_TABLET | ORAL | Status: DC | PRN

## 2022-10-16 MED ORDER — ROCURONIUM BROMIDE 10 MG/ML (PF) SYRINGE
PREFILLED_SYRINGE | INTRAVENOUS | Status: DC | PRN
  Administered 2022-10-16 (×2): 20 mg via INTRAVENOUS
  Administered 2022-10-16: 60 mg via INTRAVENOUS
  Administered 2022-10-16: 20 mg via INTRAVENOUS

## 2022-10-16 MED ORDER — ONDANSETRON HCL 4 MG PO TABS
4.0000 mg | ORAL_TABLET | ORAL | Status: DC | PRN
  Administered 2022-10-20: 4 mg via ORAL
  Filled 2022-10-16: qty 1

## 2022-10-16 MED ORDER — POTASSIUM CHLORIDE IN NACL 20-0.9 MEQ/L-% IV SOLN: INTRAVENOUS | Status: DC

## 2022-10-16 MED ORDER — FENTANYL CITRATE (PF) 100 MCG/2ML IJ SOLN: 25.0000 ug | INTRAMUSCULAR | Status: DC | PRN

## 2022-10-16 MED ORDER — BUPIVACAINE-EPINEPHRINE 0.5% -1:200000 IJ SOLN
INTRAMUSCULAR | Status: DC | PRN
  Administered 2022-10-16: 20 mL
  Administered 2022-10-16: 10 mL

## 2022-10-16 MED ORDER — ORAL CARE MOUTH RINSE
15.0000 mL | OROMUCOSAL | Status: DC
  Administered 2022-10-16 – 2022-10-23 (×23): 15 mL via OROMUCOSAL

## 2022-10-16 MED ORDER — ESMOLOL HCL 100 MG/10ML IV SOLN
INTRAVENOUS | Status: AC
  Filled 2022-10-16: qty 10

## 2022-10-16 MED ORDER — DEXAMETHASONE SODIUM PHOSPHATE 10 MG/ML IJ SOLN
INTRAMUSCULAR | Status: AC
  Filled 2022-10-16: qty 1

## 2022-10-16 MED ORDER — CEFAZOLIN SODIUM-DEXTROSE 2-4 GM/100ML-% IV SOLN
INTRAVENOUS | Status: AC
  Filled 2022-10-16: qty 100

## 2022-10-16 MED ORDER — ORAL CARE MOUTH RINSE: 15.0000 mL | Freq: Once | OROMUCOSAL | Status: DC

## 2022-10-16 MED ORDER — BUPIVACAINE-EPINEPHRINE (PF) 0.5% -1:200000 IJ SOLN
INTRAMUSCULAR | Status: AC
  Filled 2022-10-16: qty 30

## 2022-10-16 MED ORDER — SODIUM CHLORIDE 3 % IV SOLN
INTRAVENOUS | Status: DC
  Filled 2022-10-16 (×4): qty 500

## 2022-10-16 MED ORDER — PROPOFOL 10 MG/ML IV BOLUS
INTRAVENOUS | Status: AC
  Filled 2022-10-16: qty 20

## 2022-10-16 MED ORDER — SUGAMMADEX SODIUM 200 MG/2ML IV SOLN
INTRAVENOUS | Status: DC | PRN
  Administered 2022-10-16: 200 mg via INTRAVENOUS

## 2022-10-16 MED ORDER — ACETAMINOPHEN 10 MG/ML IV SOLN
INTRAVENOUS | Status: AC
  Filled 2022-10-16: qty 100

## 2022-10-16 MED ORDER — CLEVIDIPINE BUTYRATE 0.5 MG/ML IV EMUL
INTRAVENOUS | Status: AC
  Filled 2022-10-16: qty 50

## 2022-10-16 MED ORDER — DEXAMETHASONE SODIUM PHOSPHATE 10 MG/ML IJ SOLN
INTRAMUSCULAR | Status: DC | PRN
  Administered 2022-10-16: 10 mg via INTRAVENOUS

## 2022-10-16 MED ORDER — ACETAMINOPHEN 10 MG/ML IV SOLN
INTRAVENOUS | Status: DC | PRN
  Administered 2022-10-16: 1000 mg via INTRAVENOUS

## 2022-10-16 MED ORDER — LISINOPRIL 20 MG PO TABS: 20.0000 mg | ORAL_TABLET | Freq: Every day | ORAL | Status: DC

## 2022-10-16 MED ORDER — FENTANYL CITRATE (PF) 250 MCG/5ML IJ SOLN
INTRAMUSCULAR | Status: AC
  Filled 2022-10-16: qty 5

## 2022-10-16 MED ORDER — ORAL CARE MOUTH RINSE: 15.0000 mL | OROMUCOSAL | Status: DC | PRN

## 2022-10-16 MED ORDER — LIDOCAINE 2% (20 MG/ML) 5 ML SYRINGE
INTRAMUSCULAR | Status: DC | PRN
  Administered 2022-10-16: 80 mg via INTRAVENOUS

## 2022-10-16 MED ORDER — PANTOPRAZOLE SODIUM 40 MG IV SOLR
40.0000 mg | Freq: Every day | INTRAVENOUS | Status: DC
  Administered 2022-10-16 – 2022-10-20 (×5): 40 mg via INTRAVENOUS
  Filled 2022-10-16 (×5): qty 10

## 2022-10-16 MED ORDER — SODIUM CHLORIDE 0.9 % IV SOLN: INTRAVENOUS | Status: DC | PRN

## 2022-10-16 MED ORDER — LIDOCAINE 2% (20 MG/ML) 5 ML SYRINGE
INTRAMUSCULAR | Status: AC
  Filled 2022-10-16: qty 5

## 2022-10-16 MED ORDER — ACETAMINOPHEN 650 MG RE SUPP: 650.0000 mg | RECTAL | Status: DC | PRN

## 2022-10-16 MED ORDER — PHENYLEPHRINE HCL-NACL 20-0.9 MG/250ML-% IV SOLN
INTRAVENOUS | Status: DC | PRN
  Administered 2022-10-16: 20 ug/min via INTRAVENOUS

## 2022-10-16 MED ORDER — THROMBIN 5000 UNITS EX SOLR
OROMUCOSAL | Status: DC | PRN
  Administered 2022-10-16: 5 mL via TOPICAL

## 2022-10-16 MED ORDER — AMLODIPINE BESYLATE 10 MG PO TABS: 10.0000 mg | ORAL_TABLET | Freq: Every day | ORAL | Status: DC

## 2022-10-16 MED ORDER — EPHEDRINE SULFATE-NACL 50-0.9 MG/10ML-% IV SOSY
PREFILLED_SYRINGE | INTRAVENOUS | Status: DC | PRN
  Administered 2022-10-16: 5 mg via INTRAVENOUS

## 2022-10-16 MED ORDER — CHLORHEXIDINE GLUCONATE 0.12 % MT SOLN
OROMUCOSAL | Status: AC
  Filled 2022-10-16: qty 15

## 2022-10-16 MED ORDER — LABETALOL HCL 5 MG/ML IV SOLN: 10.0000 mg | INTRAVENOUS | Status: DC | PRN

## 2022-10-16 MED ORDER — LABETALOL HCL 5 MG/ML IV SOLN
10.0000 mg | INTRAVENOUS | Status: DC | PRN
  Administered 2022-10-16 – 2022-10-22 (×2): 20 mg via INTRAVENOUS
  Filled 2022-10-16 (×2): qty 4

## 2022-10-16 MED ORDER — ONDANSETRON HCL 4 MG/2ML IJ SOLN
INTRAMUSCULAR | Status: AC
  Filled 2022-10-16: qty 2

## 2022-10-16 MED ORDER — THROMBIN 5000 UNITS EX SOLR
CUTANEOUS | Status: AC
  Filled 2022-10-16: qty 5000

## 2022-10-16 MED ORDER — SODIUM CHLORIDE 3 % IV SOLN: INTRAVENOUS | Status: DC

## 2022-10-16 MED ORDER — FENTANYL CITRATE (PF) 250 MCG/5ML IJ SOLN
INTRAMUSCULAR | Status: DC | PRN
  Administered 2022-10-16 (×4): 50 ug via INTRAVENOUS

## 2022-10-16 MED ORDER — BACITRACIN ZINC 500 UNIT/GM EX OINT
TOPICAL_OINTMENT | CUTANEOUS | Status: AC
  Filled 2022-10-16: qty 28.35

## 2022-10-16 MED ORDER — ACETAMINOPHEN 325 MG PO TABS
650.0000 mg | ORAL_TABLET | ORAL | Status: DC | PRN
  Administered 2022-10-18 – 2022-10-22 (×12): 650 mg via ORAL
  Filled 2022-10-16 (×13): qty 2

## 2022-10-16 MED ORDER — ACETAMINOPHEN 650 MG RE SUPP: 650.0000 mg | Freq: Four times a day (QID) | RECTAL | Status: DC | PRN

## 2022-10-16 MED ORDER — CHLORHEXIDINE GLUCONATE 0.12 % MT SOLN: 15.0000 mL | Freq: Once | OROMUCOSAL | Status: DC

## 2022-10-16 MED ORDER — ROCURONIUM BROMIDE 10 MG/ML (PF) SYRINGE
PREFILLED_SYRINGE | INTRAVENOUS | Status: AC
  Filled 2022-10-16: qty 10

## 2022-10-16 MED ORDER — ONDANSETRON HCL 4 MG/2ML IJ SOLN
INTRAMUSCULAR | Status: DC | PRN
  Administered 2022-10-16: 4 mg via INTRAVENOUS

## 2022-10-16 MED ORDER — LISINOPRIL-HYDROCHLOROTHIAZIDE 20-12.5 MG PO TABS: 1.0000 | ORAL_TABLET | Freq: Every day | ORAL | Status: DC

## 2022-10-16 MED ORDER — MIDAZOLAM HCL 2 MG/2ML IJ SOLN
INTRAMUSCULAR | Status: AC
  Filled 2022-10-16: qty 2

## 2022-10-16 SURGICAL SUPPLY — 64 items
BAG COUNTER SPONGE SURGICOUNT (BAG) ×2 IMPLANT
BAG SPNG CNTER NS LX DISP (BAG) ×2
BAND INSRT 18 STRL LF DISP RB (MISCELLANEOUS) ×4
BAND RUBBER #18 3X1/16 STRL (MISCELLANEOUS) ×4 IMPLANT
BIT DRILL WIRE PASS 1.3MM (BIT) IMPLANT
BLADE CLIPPER SPEC (BLADE) ×2 IMPLANT
BLADE SURG 15 STRL LF DISP TIS (BLADE) ×1 IMPLANT
BLADE SURG 15 STRL SS (BLADE) ×2
BUR PRECISION FLUTE 6.0 (BURR) ×2 IMPLANT
BUR SPIRAL ROUTER 2.3 (BUR) ×1 IMPLANT
CANISTER SUCT 3000ML PPV (MISCELLANEOUS) ×2 IMPLANT
CLIP RANEY DISP (INSTRUMENTS) ×2 IMPLANT
CLIP VESOCCLUDE MED 6/CT (CLIP) IMPLANT
COVER BACK TABLE 60X90IN (DRAPES) IMPLANT
COVER SURGICAL LIGHT HANDLE (MISCELLANEOUS) ×1 IMPLANT
DRAPE NEUROLOGICAL W/INCISE (DRAPES) ×2 IMPLANT
DRAPE SURG 17X23 STRL (DRAPES) IMPLANT
DRAPE WARM FLUID 44X44 (DRAPES) ×2 IMPLANT
DRILL WIRE PASS 1.3MM (BIT)
DRSG OPSITE POSTOP 4X8 (GAUZE/BANDAGES/DRESSINGS) ×4 IMPLANT
ELECT REM PT RETURN 9FT ADLT (ELECTROSURGICAL) ×2
ELECTRODE REM PT RTRN 9FT ADLT (ELECTROSURGICAL) ×2 IMPLANT
EVACUATOR 1/8 PVC DRAIN (DRAIN) IMPLANT
EVACUATOR SILICONE 100CC (DRAIN) ×2 IMPLANT
FORCEPS BIPOLAR SPETZLER 8 1.0 (NEUROSURGERY SUPPLIES) ×1 IMPLANT
GAUZE 4X4 16PLY ~~LOC~~+RFID DBL (SPONGE) IMPLANT
GAUZE SPONGE 4X4 12PLY STRL (GAUZE/BANDAGES/DRESSINGS) IMPLANT
GLOVE BIO SURGEON STRL SZ 6.5 (GLOVE) ×4 IMPLANT
GLOVE BIO SURGEON STRL SZ7 (GLOVE) ×1 IMPLANT
GLOVE BIO SURGEON STRL SZ8 (GLOVE) ×2 IMPLANT
GLOVE BIO SURGEON STRL SZ8.5 (GLOVE) ×2 IMPLANT
GLOVE EXAM NITRILE XL STR (GLOVE) IMPLANT
GOWN STRL REUS W/ TWL LRG LVL3 (GOWN DISPOSABLE) ×2 IMPLANT
GOWN STRL REUS W/ TWL XL LVL3 (GOWN DISPOSABLE) ×1 IMPLANT
GOWN STRL REUS W/TWL LRG LVL3 (GOWN DISPOSABLE) ×4
GOWN STRL REUS W/TWL XL LVL3 (GOWN DISPOSABLE) ×2
HEMOSTAT POWDER KIT SURGIFOAM (HEMOSTASIS) ×2 IMPLANT
KIT BASIN OR (CUSTOM PROCEDURE TRAY) ×2 IMPLANT
KIT TURNOVER KIT B (KITS) ×2 IMPLANT
MARKER SKIN DUAL TIP RULER LAB (MISCELLANEOUS) IMPLANT
NEEDLE HYPO 22GX1.5 SAFETY (NEEDLE) ×2 IMPLANT
NS IRRIG 1000ML POUR BTL (IV SOLUTION) ×3 IMPLANT
PACK CRANIOTOMY CUSTOM (CUSTOM PROCEDURE TRAY) ×2 IMPLANT
PAD ARMBOARD 7.5X6 YLW CONV (MISCELLANEOUS) ×2 IMPLANT
PATTIES SURGICAL .25X.25 (GAUZE/BANDAGES/DRESSINGS) IMPLANT
PATTIES SURGICAL .5 X.5 (GAUZE/BANDAGES/DRESSINGS) IMPLANT
PATTIES SURGICAL .5 X3 (DISPOSABLE) IMPLANT
PATTIES SURGICAL 1X1 (DISPOSABLE) IMPLANT
PIN MAYFIELD SKULL DISP (PIN) ×1 IMPLANT
SPONGE NEURO XRAY DETECT 1X3 (DISPOSABLE) IMPLANT
SPONGE SURGIFOAM ABS GEL 100 (HEMOSTASIS) IMPLANT
STAPLER SKIN PROX WIDE 3.9 (STAPLE) ×2 IMPLANT
SUT ETHILON 3 0 FSL (SUTURE) IMPLANT
SUT NURALON 4 0 TR CR/8 (SUTURE) ×4 IMPLANT
SUT PROLENE 6 0 BV (SUTURE) IMPLANT
SUT VIC AB 2-0 CP2 18 (SUTURE) ×5 IMPLANT
SUT VIC AB 3-0 FS2 27 (SUTURE) ×2 IMPLANT
SUT VICRYL 4-0 PS2 18IN ABS (SUTURE) IMPLANT
TOWEL GREEN STERILE (TOWEL DISPOSABLE) ×2 IMPLANT
TOWEL GREEN STERILE FF (TOWEL DISPOSABLE) ×2 IMPLANT
TRAY FOLEY MTR SLVR 16FR STAT (SET/KITS/TRAYS/PACK) ×1 IMPLANT
TUBING FEATHERFLOW (TUBING) IMPLANT
UNDERPAD 30X36 HEAVY ABSORB (UNDERPADS AND DIAPERS) IMPLANT
WATER STERILE IRR 1000ML POUR (IV SOLUTION) ×2 IMPLANT

## 2022-10-16 NOTE — Consult Note (Signed)
Reason for Consult: Right brain stroke Referring Physician: Dr. Vale Haven is an 57 y.o. female.  HPI: The patient is a 57 year old obese black female with a history of hypertension who presented to the ER with left hemiplegia.  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  A neurosurgical consultation was requested regarding a decompressive craniectomy.  Presently the patient is alert and pleasant.  She is oriented x 3.  She complains of a headache.  She is right-handed.  Past Medical History:  Diagnosis Date   Hypertension     No past surgical history on file.  Family History  Problem Relation Age of Onset   Cancer Mother    Heart disease Father    Glaucoma Father    Varicose Veins Paternal Grandfather    Breast cancer Neg Hx     Social History:  reports that she has been smoking cigarettes. She has been smoking an average of .1 packs per day. She has never used smokeless tobacco. She reports current alcohol use. She reports current drug use. Drug: Cocaine.  Allergies: No Known Allergies  Medications: I have reviewed the patient's current medications. Prior to Admission: (Not in a hospital admission)  Scheduled: Continuous:  sodium chloride (hypertonic) 50 mL/hr at 10/16/22 0600   BPZ:WCHENIDPOEUMP, hydrALAZINE Anti-infectives (From admission, onward)    None        Results for orders placed or performed during the hospital encounter of 10/16/22 (from the past 48 hour(s))  Ethanol     Status: None   Collection Time: 10/16/22  4:12 AM  Result Value Ref Range   Alcohol, Ethyl (B) <10 <10 mg/dL    Comment: (NOTE) Lowest detectable limit for serum alcohol is 10 mg/dL.  For medical purposes only. Performed at [facility], [place] [address], [place], [place] [address]   CBC     Status: Abnormal   Collection Time: 10/16/22  4:12 AM  Result Value Ref Range   WBC 11.3 (H) 4.0 - 10.5 K/uL   RBC 5.76 (H) 3.87 - 5.11 MIL/uL   Hemoglobin 17.1 (H)  12.0 - 15.0 g/dL   HCT 51.3 (H) 36.0 - 46.0 %   MCV 89.1 80.0 - 100.0 fL   MCH 29.7 26.0 - 34.0 pg   MCHC 33.3 30.0 - 36.0 g/dL   RDW 14.2 11.5 - 15.5 %   Platelets 245 150 - 400 K/uL   nRBC 0.0 0.0 - 0.2 %    Comment: Performed at [facility], [place] [address], [place], [place] [address]  Differential     Status: Abnormal   Collection Time: 10/16/22  4:12 AM  Result Value Ref Range   Neutrophils Relative % 81 %   Neutro Abs 9.1 (H) 1.7 - 7.7 K/uL   Lymphocytes Relative 15 %   Lymphs Abs 1.7 0.7 - 4.0 K/uL   Monocytes Relative 3 %   Monocytes Absolute 0.3 0.1 - 1.0 K/uL   Eosinophils Relative 0 %   Eosinophils Absolute 0.0 0.0 - 0.5 K/uL   Basophils Relative 0 %   Basophils Absolute 0.1 0.0 - 0.1 K/uL   Immature Granulocytes 1 %   Abs Immature Granulocytes 0.07 0.00 - 0.07 K/uL    Comment: Performed at [place] [address], [place], [place] [address]  I-stat chem 8, ED     Status: Abnormal   Collection Time: 10/16/22  4:13 AM  Result Value Ref Range  Sodium 138 135 - 145 mmol/L   Potassium 7.0 (HH) 3.5 - 5.1 mmol/L   Chloride 109 98 - 111 mmol/L   BUN 42 (H) 6 - 20 mg/dL   Creatinine, Ser 0.90 0.44 - 1.00 mg/dL   Glucose, Bld 161 (H) 70 - 99 mg/dL    Comment: Glucose reference range applies only to samples taken after fasting for at least 8 hours.   Calcium, Ion 1.00 (L) 1.15 - 1.40 mmol/L   TCO2 22 22 - 32 mmol/L   Hemoglobin 17.3 (H) 12.0 - 15.0 g/dL   HCT 51.0 (H) 36.0 - 46.0 %   Comment NOTIFIED PHYSICIAN   I-stat chem 8, ed     Status: Abnormal   Collection Time: 10/16/22  5:01 AM  Result Value Ref Range   Sodium 141 135 - 145 mmol/L   Potassium 3.8 3.5 - 5.1 mmol/L   Chloride 113 (H) 98 - 111 mmol/L   BUN 29 (H) 6 - 20 mg/dL   Creatinine, Ser 0.90 0.44 - 1.00 mg/dL   Glucose, Bld 138 (H) 70 - 99 mg/dL    Comment: Glucose reference range applies only to samples taken after fasting for at least 8 hours.   Calcium, Ion 0.93 (L) 1.15 - 1.40  mmol/L   TCO2 16 (L) 22 - 32 mmol/L   Hemoglobin 18.0 (H) 12.0 - 15.0 g/dL   HCT 53.0 (H) 36.0 - 46.0 %  I-Stat beta hCG blood, ED     Status: None   Collection Time: 10/16/22  5:22 AM  Result Value Ref Range   I-stat hCG, quantitative <5.0 <5 mIU/mL   Comment 3            Comment:   GEST. AGE      CONC.  (mIU/mL)   <=1 WEEK        5 - 50     2 WEEKS       50 - 500     3 WEEKS       100 - 10,000     4 WEEKS     1,000 - 30,000        FEMALE AND NON-PREGNANT FEMALE:     LESS THAN 5 mIU/mL   Comprehensive metabolic panel     Status: Abnormal   Collection Time: 10/16/22  5:53 AM  Result Value Ref Range   Sodium 139 135 - 145 mmol/L   Potassium 4.7 3.5 - 5.1 mmol/L   Chloride 105 98 - 111 mmol/L   CO2 23 22 - 32 mmol/L   Glucose, Bld 118 (H) 70 - 99 mg/dL    Comment: Glucose reference range applies only to samples taken after fasting for at least 8 hours.   BUN 27 (H) 6 - 20 mg/dL   Creatinine, Ser 0.97 0.44 - 1.00 mg/dL   Calcium 8.9 8.9 - 10.3 mg/dL   Total Protein 6.7 6.5 - 8.1 g/dL   Albumin 3.8 3.5 - 5.0 g/dL   AST 25 15 - 41 U/L   ALT 23 0 - 44 U/L   Alkaline Phosphatase 57 38 - 126 U/L   Total Bilirubin 0.7 0.3 - 1.2 mg/dL   GFR, Estimated >60 >60 mL/min    Comment: (NOTE) Calculated using the CKD-EPI Creatinine Equation (2021)    Anion gap 11 5 - 15    Comment: Performed at [facility] [address], [place], [place] [address]  Magnesium     Status: None  Collection Time: 10/16/22  5:53 AM  Result Value Ref Range   Magnesium 2.3 1.7 - 2.4 mg/dL    Comment: Performed at [facility], [address] [place], [place] [address]  Protime-INR     Status: None   Collection Time: 10/16/22  6:46 AM  Result Value Ref Range   Prothrombin Time 13.0 11.4 - 15.2 seconds   INR 1.0 0.8 - 1.2    Comment: (NOTE) INR goal varies based on device and disease states. Performed at [facility], [address], [place], [place] [address]   APTT     Status:  Abnormal   Collection Time: 10/16/22  6:46 AM  Result Value Ref Range   aPTT 22 (L) 24 - 36 seconds    Comment: Performed at [facility], [address] [place], [place] [address]    CT HEAD CODE STROKE WO CONTRAST  Result Date: 10/16/2022 CLINICAL DATA:  Code stroke. 57 year old female with left side deficits. EXAM: CT HEAD WITHOUT CONTRAST TECHNIQUE: Contiguous axial images were obtained from the base of the skull through the vertex without intravenous contrast. RADIATION DOSE REDUCTION: This exam was performed according to the departmental dose-optimization program which includes automated exposure control, adjustment of the mA and/or kV according to patient size and/or use of iterative reconstruction technique. COMPARISON:  None Available. FINDINGS: Brain: Extremely hyperdense right MCA M1 segment on series 2, image 11. And cytotoxic edema with loss of normal gray-white differentiation throughout most of the right MCA territory. ASPECTS 2. Partially effaced right lateral ventricle but no midline shift. No hemorrhagic transformation identified. Basilar cisterns remain patent. Left hemisphere gray-white differentiation appears maintained. There is patchy bilateral periventricular white matter hypodensity. Chronic appearing small right lateral cerebellar infarct. Other posterior fossa gray-white differentiation within normal limits. Vascular: Faint Calcified atherosclerosis at the skull base. Hyperdense right MCA M1 beginning just distal to its origin. Skull: No acute osseous abnormality identified. Sinuses/Orbits: Visualized paranasal sinuses and mastoids are clear. Other: Mildly Disconjugate gaze.  Negative scalp. ASPECTS [facility], [facility], [facility] Stroke Program Early CT Score) - Ganglionic level infarction (caudate, lentiform nuclei, internal capsule, insula, M1-M3 cortex): 1 - Supraganglionic infarction (M4-M6 cortex): 1 Total score (0-10 with 10 being normal): 2 IMPRESSION: 1. Large Right MCA infarct. ASPECTS  2. No hemorrhagic transformation, and only minor intracranial mass effect at this time with no midline shift. 2. Underlying bilateral white matter disease and chronic appearing right cerebellar infarct. 3. These results were communicated to Dr. Otelia Limes at 4:26 am on 10/16/2022 by text page via the [facility] messaging system. Electronically Signed   By: Odessa Fleming M.D.   On: 10/16/2022 04:27    ROS: As above Blood pressure (!) 126/98, pulse 71, resp. rate 17, SpO2 98 %. Estimated body mass index is 38.87 kg/m as calculated from the following:   Height as of 09/23/22: 5\' 2"  (1.575 m).   Weight as of 09/23/22: 96.4 kg.  Physical Exam  General: An alert and attentive 57 year old obese black female in no apparent distress  HEENT: The patient has a right gaze preference.  Her pupils are equal.  Neck: Unremarkable, normal range of motion  Thorax: Symmetric  Abdomen: Obese and soft  Extremities: Unremarkable  Neurologic exam: The patient is alert and oriented x 3.  She has a right gaze preference as above.  Her pupils are equal.  She has a left facial droop.  She is left hemiplegic.  The patient's motor strength is normal in her right  upper and lower extremity.  Imaging studies: I have reviewed the patient's head CT performed at [facility] today.  It demonstrates a large right MCA infarct.  There is no shift presently.  Assessment/Plan: Large right MCA infarct: I have discussed the situation with the patient.  We discussed the various treatment options including medical management versus surgery.  I told her that there is a high probability of swelling and neurologic decline.  We discussed the surgical option of a right decompressive craniectomy with placement of the bone flap in her abdomen.  I described the surgery to her.  We discussed the risk including risk of anesthesia, hemorrhage, infection, failure to help her, medical risk, etc.  I have answered all her questions.  She wants to proceed  with surgery.  Cristi Loron 10/16/2022, 7:57 AM

## 2022-10-16 NOTE — Code Documentation (Signed)
Responded to Code Stroke called at 6004 for L sided deficits, [id number]. Pt arrived at 0401, CBG-161, NIH-20, CT head-large R MCA infarct, ASPECTS-2. TNK not given-completed stroke. Plan: PCCM and neurosurgery consults, hypertonic saline, ICU admission.

## 2022-10-16 NOTE — Progress Notes (Addendum)
STROKE TEAM PROGRESS NOTE   INTERVAL HISTORY Patient missed during AM rounds as she was going to the OR for emergency right hemicraniectomy.  She presented with sudden onset of right gaze deviation, left hemiplegia and hemineglect and sensory loss with NIH stroke scale of 20 with CT scan showing large right MCA infarct with low aspect score of 2.  Patient was not given TNK due to large size of the infarct during exclusion.  She was felt to likely be at risk for brain herniation and neurological death due to malignant cerebral edema and taken for emergency right hemicraniectomy by neurosurgery after discussion of risk benefits with the family.  She had surgery earlier today and was extubated No family at the bedside, snoring initially, appears to have eye-opening apraxia.  She is following commands consistently on the right.  There is mild left hemineglect and dense left hemiplegia JP drain in place. Craniotomy site CDI, bone flap in abdomen   Vitals:   10/16/22 1300 10/16/22 1315 10/16/22 1330 10/16/22 1400  BP: 109/79 114/82 115/78 129/88  Pulse: 64 68 66 66  Resp: 15 13 12 18   Temp:   98.6 F (37 C)   TempSrc:      SpO2: 94% 94% 94% 97%  Weight:      Height:       CBC:  Recent Labs  Lab 10/16/22 0412 10/16/22 0413 10/16/22 0501  WBC 11.3*  --   --   NEUTROABS 9.1*  --   --   HGB 17.1* 17.3* 18.0*  HCT 51.3* 51.0* 53.0*  MCV 89.1  --   --   PLT 245  --   --    Basic Metabolic Panel:  Recent Labs  Lab 10/16/22 0501 10/16/22 0553 10/16/22 1407  NA 141 139 139  K 3.8 4.7  --   CL 113* 105  --   CO2  --  23  --   GLUCOSE 138* 118*  --   BUN 29* 27*  --   CREATININE 0.90 0.97  --   CALCIUM  --  8.9  --   MG  --  2.3  --    Lipid Panel: No results for input(s): "CHOL", "TRIG", "HDL", "CHOLHDL", "VLDL", "LDLCALC" in the last 168 hours. HgbA1c: No results for input(s): "HGBA1C" in the last 168 hours. Urine Drug Screen: No results for input(s): "LABOPIA", "COCAINSCRNUR",  "LABBENZ", "AMPHETMU", "THCU", "LABBARB" in the last 168 hours.  Alcohol Level  Recent Labs  Lab 10/16/22 0412  ETH <10    IMAGING past 24 hours CT HEAD CODE STROKE WO CONTRAST  Result Date: 10/16/2022 CLINICAL DATA:  Code stroke. 57 year old female with left side deficits. EXAM: CT HEAD WITHOUT CONTRAST TECHNIQUE: Contiguous axial images were obtained from the base of the skull through the vertex without intravenous contrast. RADIATION DOSE REDUCTION: This exam was performed according to the departmental dose-optimization program which includes automated exposure control, adjustment of the mA and/or kV according to patient size and/or use of iterative reconstruction technique. COMPARISON:  None Available. FINDINGS: Brain: Extremely hyperdense right MCA M1 segment on series 2, image 11. And cytotoxic edema with loss of normal gray-white differentiation throughout most of the right MCA territory. ASPECTS 2. Partially effaced right lateral ventricle but no midline shift. No hemorrhagic transformation identified. Basilar cisterns remain patent. Left hemisphere gray-white differentiation appears maintained. There is patchy bilateral periventricular white matter hypodensity. Chronic appearing small right lateral cerebellar infarct. Other posterior fossa gray-white differentiation within normal limits. Vascular: Faint  Calcified atherosclerosis at the skull base. Hyperdense right MCA M1 beginning just distal to its origin. Skull: No acute osseous abnormality identified. Sinuses/Orbits: Visualized paranasal sinuses and mastoids are clear. Other: Mildly Disconjugate gaze.  Negative scalp. ASPECTS [facility] Stroke Program Early CT Score) - Ganglionic level infarction (caudate, lentiform nuclei, internal capsule, insula, M1-M3 cortex): 1 - Supraganglionic infarction (M4-M6 cortex): 1 Total score (0-10 with 10 being normal): 2 IMPRESSION: 1. Large Right MCA infarct. ASPECTS 2. No hemorrhagic transformation, and only  minor intracranial mass effect at this time with no midline shift. 2. Underlying bilateral white matter disease and chronic appearing right cerebellar infarct. 3. These results were communicated to Dr. Otelia Limes at 4:26 am on 10/16/2022 by text page via the [facility] messaging system. Electronically Signed   By: Odessa Fleming M.D.   On: 10/16/2022 04:27    PHYSICAL EXAM  Constitutional: Appears well-developed and well-nourished.   Cardiovascular: Normal rate and regular rhythm.  Respiratory: snoring, rate/rhythm regular  Neuro: Mental Status: Patient is snoring, awakens to voice, nods appropriately. Follows commands on the right with eyes closed, no verbal output on my exam. Nursing states that she did say her name when asked.  Right gaze preference and unable to look to the left past midline. Cranial Nerves: Eyes to the right with forced eye opening. Unable able to bring them midline.Left facial droop. Hearing is intact to voice Head turned to the right Motor: Moves right upper and lower extremity antigravity.  No movement noted on the left.  Sensory: Localizes to painful stimuli on the right. No movement with painful stimuli on the left. States she can not feel light or deep pain on the left but does acknowledge the left side    ASSESSMENT/PLAN Sheri Beck is a 57 y.o. female with history of hypertension presenting with left hemiplegia, depressed level of consciousness and rightward gaze.   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  Patient appears to be a rapid progresser of malignant cerebral edema with significant established edema within few hours of onset Etiology:  large vessel occlusion right ICA or right middle cerebral artery likely. Code Stroke CT head - Large Right MCA infarct. ASPECTS 2. No hemorrhagic transformation, and only minor intracranial mass effect at this time with no midline  shift. CTA head & neck pending CT Repeat 1/14 MRI  pending 2D Echo pending LDL 151 HgbA1c 6.3 VTE prophylaxis - scds    Diet   Diet NPO time specified   No antithrombotic prior to admission, now on No antithrombotic.  Therapy recommendations:  Pending Disposition:  Pending   Brain Compression Hypertonic saline 50cc/hr S/p right frontal temporal parietal decompressive craniectomy with insertion of craniotomy flap into abdomen, JP drain in place  Hypertension Home meds:  Amlodipine 10mg , lisinopril-hydrochlorothiazide  Stable BP goal less than 160 PRN hydralazine and labetalol  Long-term BP goal normotensive  Hyperlipidemia Home meds:  None, resumed in hospital LDL 151, goal < 70 Add statin when no longer NPO  Other Stroke Risk Factors Obesity, Body mass index is 39.69 kg/m., BMI >/= 30 associated with increased stroke risk, recommend weight loss, diet and exercise as appropriate   Other Active Problems   Hospital day # 0  Patient seen and examined by NP/APP with MD. MD to update note as needed.   , DNP, FNP-BC [facility] Pager: [phone]  STROKE MD NOTE :  I  have personally obtained history,examined this patient, reviewed notes, independently viewed imaging studies, participated in medical decision making and plan of care.ROS completed by me personally and pertinent positives fully documented  I have made any additions or clarifications directly to the above note. Agree with note above.  Patient presented with sudden onset of right gaze deviation and left hemiplegia and hemisensory loss with NIH stroke scale of 20 likely due to large right MCA infarct presented within time window for thrombolysis but CT scan shows established large right MCA infarct with aspect score of 2 making it contraindication with high risk for hemorrhagic transformation with TNK.  Patient was taken for emergency hemicraniectomy after discussion with neurosurgery.   Recommend close neurological follow-up and strict control of hypertension with systolic goal below 161 and continue hypertonic saline for cerebral edema.  Repeat CT head and CT angiogram of brain and neck tomorrow morning.  No family available at the bedside for discussion.  Continue ongoing stroke workup with echocardiogram and cardiac monitoring.  Discussed with Dr. Lynetta Mare critical care medicine.This patient is critically ill and at significant risk of neurological worsening, death and care requires constant monitoring of vital signs, hemodynamics,respiratory and cardiac monitoring, extensive review of multiple databases, frequent neurological assessment, discussion with family, other specialists and medical decision making of high complexity.I have made any additions or clarifications directly to the above note.This critical care time does not reflect procedure time, or teaching time or supervisory time of PA/NP/Med Resident etc but could involve care discussion time.  I spent 40 minutes of neurocritical care time  in the care of  this patient.      Antony Contras, MD Medical Director [facility] St[facility] Pager: [phone] 10/16/2022 3:53 PM  To contact Stroke Continuity provider, please refer to [email or web address] After hours, contact General Neurology

## 2022-10-16 NOTE — H&P (Signed)
NAME:  Sheri Beck, MRN:  [id number], DOB:  09-Jun-1966, LOS: 0 ADMISSION DATE:  10/16/2022, CONSULTATION DATE:  1/13 REFERRING MD:  Dr. Randal Buba, CHIEF COMPLAINT:  R MCA   History of Present Illness:  Patient is a 57 year old female with pertinent PMH of HTN, prediabetes presents to [facility] ED on 1/13 code stroke.  Patient's LKN on 1/13 at 98.  Husband witnessed patient having left facial droop and slurred speech.  EMS called and witnessed patient with left-sided weakness and lethargy; unequal pupils.  Patient transported to [facility] as code stroke.  Upon arrival to [facility], patient's vital stable.  Neuro consulted.  Patient with left-sided deficits.  CT head showing large right MCA infarct. Not tnk candidate due to risk of hemorrhagic transformation.  High risk of developing significant edema. NSG consulted and planning to start patient on hypertonic saline. Labs pending. PCCM consulted for icu admission.  Pertinent  Medical History   Past Medical History:  Diagnosis Date   Hypertension      Significant Hospital Events: Including procedures, antibiotic start and stop dates in addition to other pertinent events   1/13 large R mca stroke w/ cerebral edema; neuro and nsg following; hypertonic saline for edema; pccm to admit  Interim History / Subjective:  See above  Objective   There were no vitals taken for this visit.       No intake or output data in the 24 hours ending 10/16/22 0458 There were no vitals filed for this visit.  Examination: General:  ill appearing female in nad HEENT: MM pink/moist Neuro: mild lethargy; Aox3; Left sided hemiplegia; dysarthria; right gaze deviation CV: s1s2, RRR, no m/r/g PULM:  dim clear BS bilaterally; on ra GI: soft, bsx4 active  Extremities: warm/dry, no edema  Skin: no rashes or lesions appreciated   Resolved Hospital Problem list     Assessment & Plan:  R MCA stroke -high risk of developing significant edema w/ mass effect and possible  herniation Plan: -neuro following; appreciate recs -NSG consulted -starting on hypertonic saline; na goal per neuro; frequent na checks -further imaging per neuro -f/u UDS, ethanol -frequent neuro checks -limit sedating meds -BP goal per neuro -statin, DAPT per neuro -Continue neuroprotective measures- normothermia, euglycemia, HOB greater than 30, head in neutral alignment, normocapnia, normoxia.  -PT/OT/SLP  Hyperkalemia: on istat Plan: -cmp pending to confirm istat -consider temporizing meds pending cmp results  HTN Plan: -bp management per neuro -hold asa, norvasc, and liniopril/hctz while npo   Prediabetes -on 1/2 A1C 6.3 Plan: -cbg monitoring  Leukocytosis -reactive? Plan: -trend wbc/fever curve -f/u UA  Best Practice (right click and "Reselect all SmartList Selections" daily)   Diet/type: NPO DVT prophylaxis: SCD GI prophylaxis: N/A Lines: N/A Foley:  N/A Code Status:  full code Last date of multidisciplinary goals of care discussion [1/13 spoke with husband and patient at bedside]  Labs   CBC: Recent Labs  Lab 10/16/22 0412 10/16/22 0413  WBC 11.3*  --   NEUTROABS 9.1*  --   HGB 17.1* 17.3*  HCT 51.3* 51.0*  MCV 89.1  --   PLT 245  --     Basic Metabolic Panel: Recent Labs  Lab 10/16/22 0413  NA 138  K 7.0*  CL 109  GLUCOSE 161*  BUN 42*  CREATININE 0.90   GFR: CrCl cannot be calculated (Unknown ideal weight.). Recent Labs  Lab 10/16/22 0412  WBC 11.3*    Liver Function Tests: No results for input(s): "AST", "ALT", "ALKPHOS", "BILITOT", "  PROT", "ALBUMIN" in the last 168 hours. No results for input(s): "LIPASE", "AMYLASE" in the last 168 hours. No results for input(s): "AMMONIA" in the last 168 hours.  ABG    Component Value Date/Time   TCO2 22 10/16/2022 0413     Coagulation Profile: No results for input(s): "INR", "PROTIME" in the last 168 hours.  Cardiac Enzymes: No results for input(s): "CKTOTAL", "CKMB",  "CKMBINDEX", "TROPONINI" in the last 168 hours.  HbA1C: Hemoglobin A1C  Date/Time Value Ref Range Status  04/23/2020 01:52 PM 5.9 (A) 4.0 - 5.6 % Final  03/31/2018 11:31 AM 5.8 (A) 4.0 - 5.6 % Final   Hgb A1c MFr Bld  Date/Time Value Ref Range Status  10/05/2022 07:15 AM 6.3 4.6 - 6.5 % Final    Comment:    Glycemic Control Guidelines for People with Diabetes:Non Diabetic:  <6%Goal of Therapy: <7%Additional Action Suggested:  >8%     CBG: No results for input(s): "GLUCAP" in the last 168 hours.  Review of Systems:   Patient is encephalopathic and/or intubated. Therefore history has been obtained from chart review.    Past Medical History:  She,  has a past medical history of Hypertension.   Surgical History:  No past surgical history on file.   Social History:   reports that she has been smoking cigarettes. She has been smoking an average of .1 packs per day. She has never used smokeless tobacco. She reports current alcohol use. She reports current drug use. Drug: Cocaine.   Family History:  Her family history includes Cancer in her mother; Glaucoma in her father; Heart disease in her father; Varicose Veins in her paternal grandfather. There is no history of Breast cancer.   Allergies No Known Allergies   Home Medications  Prior to Admission medications   Medication Sig Start Date End Date Taking? Authorizing Provider  amLODipine (NORVASC) 10 MG tablet Take 1 tablet by mouth once daily 10/05/22   Farrel Conners, MD  aspirin 325 MG EC tablet Take 325 mg by mouth every 6 (six) hours as needed for pain.    [provider]  lisinopril-hydrochlorothiazide (ZESTORETIC) 20-12.5 MG tablet Take 1 tablet by mouth once daily 10/05/22   Farrel Conners, MD  naproxen (NAPROSYN) 375 MG tablet Take 1 tablet (375 mg total) by mouth 2 (two) times daily. 04/16/22   Scot Jun, FNP     Critical care time: 45 minutes    JD [facility] 10/16/2022, 5:36 AM  Please see [email or web address] for pager details.  From 7A-7P if no response, please call [phone]. After hours, please call ELink [phone].

## 2022-10-16 NOTE — Anesthesia Procedure Notes (Signed)
Arterial Line Insertion Start/End1/13/2024 8:45 AM, 10/16/2022 9:00 AM Performed by: Valda Favia, CRNA, CRNA  Patient location: Pre-op. Preanesthetic checklist: patient identified, IV checked, site marked, risks and benefits discussed, surgical consent, monitors and equipment checked, pre-op evaluation, timeout performed and anesthesia consent Lidocaine 1% used for infiltration Left, radial was placed Catheter size: 20 G Hand hygiene performed  and maximum sterile barriers used  Allen's test indicative of satisfactory collateral circulation Attempts: 1 Procedure performed without using ultrasound guided technique. Following insertion, dressing applied and Biopatch. Patient tolerated the procedure well with no immediate complications.

## 2022-10-16 NOTE — ED Notes (Signed)
Fiancee at bedside updated by neurologist regarding pts status and plan of care

## 2022-10-16 NOTE — Op Note (Signed)
The patient is a 57 year old black female presented with left hemiplegia and was found to have a large right MCA stroke.  She was sent a candidate for thrombolysis.  A neurosurgical consultation was requested regarding her brain edema.  I discussed the situation with the patient.  We discussed medical treatment versus decompressive craniectomy.  She has decided to proceed with surgery.  Preop diagnosis: Right MCA cerebrovascular accident, brain edema  Postop diagnosis: The same  Procedure: Right frontal temporal parietal decompressive craniectomy; insertion of craniotomy flap into the abdominal subcutaneous tissue  Surgeon: Dr. Earle Gell  Assistant: Arnetha Massy, NP  Anesthesia: General tracheal  Estimated blood loss: 150 cc  Specimens: None  Drains: 1 epidural Jackson-Pratt drain  Complications: None  Description of procedure: The patient was brought to the operating room by the anesthesia team.  General endotracheal anesthesia was induced.  The patient remained in the supine position.  A roll was placed on her right shoulder.  I applied the Mayfield 3 point headrest of the patient calvarium.  We turned her head to the left exposing the right scalp.  Her right scalp was then prepared with Betadine scrub and Betadine solution.  Sterile drapes were applied.  I then injected the area to be incised with Marcaine with epinephrine solution.  I used a scalpel to make a?  Type incision in the right scalp.  We used Raney clips for wound edge hemostasis.  I used the periosteal elevators and electrocautery to expose the underlying calvarium.  We used the towel clamp and rubber bands for exposure.  I then used a high-speed drill to create a right temporal right parietal and right frontal burr hole.  I freed up the underlying dura with the nerve hook.  I then used the footplate device to create a large right frontotemporoparietal craniotomy flap.  I then elevated the craniotomy flap with the  periosteal elevator and the Penfield #1.  The underlying dura was quite tense.  I then incised the dura with a 15 blade scalpel and using the Metzenbaum scissors and a Woodson elevator incised the dura along the edge of the craniectomy, leaving the base of the dura intact.  The brain was quite swollen and protruding.  We obtained hemostasis using bipolar cautery.  We irrigated the wound out with saline solution.  We now turned our attention to the insertion of the craniectomy flap into the abdomen.  I injected the area to be incised with Marcaine with epinephrine solution.  I used a scalpel to make a horizontal incision to the patient's right upper quadrant of the abdomen.  We used electrocautery to create a large subcutaneous space in the patient's abdominal fat.  I then inserted the craniotomy flap into the abdominal fat.  We then reapproximated patient's subcutaneous tissue with interrupted 2-0 Vicryl suture.  The skin with stainless steel staples.  We placed a 10 mm flat Jackson-Pratt drain in the epidural space and tunneled with L through separate stab wound.  We then reapproximated the patient's galea with interrupted 2-0 Vicryl suture.  We reapproximated the scalp skin with stainless steel staples.  The wounds were then coated with bacitracin ointment.  Sterile drapes were applied.  The drapes were removed.  I then removed the Mayfield 3 point headrest from the patient's calvarium.  By report all sponge, instrument, and needle counts were correct at the end of this case.  I then spoke with the patient's daughter.

## 2022-10-16 NOTE — OR Nursing (Signed)
Incision for the placement of Skull Flap in abdomen made at 1137. Closed with correct count at 1149.

## 2022-10-16 NOTE — Anesthesia Procedure Notes (Signed)
Procedure Name: Intubation Date/Time: 10/16/2022 9:34 AM  Performed by: Leonor Liv, CRNAPre-anesthesia Checklist: Patient identified, Emergency Drugs available, Suction available and Patient being monitored Patient Re-evaluated:Patient Re-evaluated prior to induction Oxygen Delivery Method: Circle System Utilized Preoxygenation: Pre-oxygenation with 100% oxygen Induction Type: IV induction Ventilation: Mask ventilation without difficulty and Oral airway inserted - appropriate to patient size Laryngoscope Size: Mac and 3 Grade View: Grade I Tube type: Oral Number of attempts: 1 Airway Equipment and Method: Stylet and Oral airway Placement Confirmation: ETT inserted through vocal cords under direct vision, positive ETCO2 and breath sounds checked- equal and bilateral Secured at: 21 cm Tube secured with: Tape Dental Injury: Teeth and Oropharynx as per pre-operative assessment

## 2022-10-16 NOTE — Consult Note (Signed)
NEURO HOSPITALIST CONSULT NOTE   Requestig physician: Dr. Randal Buba  Reason for Consult: Acute onset of left hemiplegia, depressed level of consciousness and rightward gaze  History obtained from:  EMS, Patient and Chart     HPI:                                                                                                                                          Sheri Beck is an 57 y.o. female with a PMHx of HTN who presents via EMS from home as a Code Stroke for acute onset of left hemiplegia, depressed level of consciousness and rightward gaze. LKN was 0230. Vitals per EMS: 130/80, HR 60's, CBG 137. Symptoms were first noticed by her fiance while they were watching TV in the living room; he noted left facial droop and slurred speech. She has no prior history of stroke. She is not on a blood thinner.   Past Medical History:  Diagnosis Date   Hypertension     No past surgical history on file.  Family History  Problem Relation Age of Onset   Cancer Mother    Heart disease Father    Glaucoma Father    Varicose Veins Paternal Grandfather    Breast cancer Neg Hx            Social History:  reports that she has been smoking cigarettes. She has been smoking an average of .1 packs per day. She has never used smokeless tobacco. She reports current alcohol use. She reports current drug use. Drug: Cocaine.  No Known Allergies  HOME MEDICATIONS:                                                                                                                      No current facility-administered medications on file prior to encounter.   Current Outpatient Medications on File Prior to Encounter  Medication Sig Dispense Refill   amLODipine (NORVASC) 10 MG tablet Take 1 tablet by mouth once daily 90 tablet 0   aspirin 325 MG EC tablet Take 325 mg by mouth every 6 (six) hours as needed for pain.     lisinopril-hydrochlorothiazide (ZESTORETIC) 20-12.5 MG tablet Take 1  tablet by mouth once daily 90 tablet 0   naproxen (NAPROSYN)  375 MG tablet Take 1 tablet (375 mg total) by mouth 2 (two) times daily. 20 tablet 0     ROS:                                                                                                                                       As per HPI. The patient does not endorse any other symptoms in the context of left hemineglect.   BP (!) 123/94   Pulse 78   Resp (!) 21   SpO2 95%    General Examination:                                                                                                       Physical Exam  HEENT-  /AT   Lungs- Respirations unlabored Extremities- No edema   Neurological Examination Mental Status: Drowsy with decreased level of alertness and left hemineglect. Speech dysarthric and sparse but fluent. Able to follow all right-sided commands. Oriented x 5. Increased latencies of verbal responses.  Cranial Nerves: II: Left visual field cut. Left pupil 4 mm and right pupil 2 mm, both reactive to penlight.   III,IV, VI: Right gaze deviation. Unable to track to the left more than halfway towards the midline. Unable to overcome with oculocephalic reflex. No nystagmus.  V: Decreased sensation to left side of face.  VII: Prominent left facial droop  VIII: Hearing intact to voice IX,X: No hoarseness XI: Head preferentially rotated to the right XII: Leftward tongue deviation Motor: RUE 4+/5 proximally and distally to commands RLE 4+/5 proximally and distally to commands LUE 0/5 to commands and noxious LLE 1-2/5 to noxious only Sensory: Severe sensory loss to LUE and LLE. Intact on the right.   Deep Tendon Reflexes: 2+ bilateral brachioradialis, biceps and patellae.  Cerebellar: No ataxia with RUE movement. Unable to assess LUE Gait: Unable to assess   Lab Results: Basic Metabolic Panel: Recent Labs  Lab 10/16/22 0413  NA 138  K 7.0*  CL 109  GLUCOSE 161*  BUN 42*  CREATININE 0.90     CBC: Recent Labs  Lab 10/16/22 0413  HGB 17.3*  HCT 51.0*    Cardiac Enzymes: No results for input(s): "CKTOTAL", "CKMB", "CKMBINDEX", "TROPONINI" in the last 168 hours.  Lipid Panel: No results for input(s): "CHOL", "TRIG", "HDL", "CHOLHDL", "VLDL", "LDLCALC" in the last 168 hours.  Imaging: CT HEAD CODE STROKE WO CONTRAST  Result Date: 10/16/2022 CLINICAL DATA:  Code stroke. 57 year old female with left  side deficits. EXAM: CT HEAD WITHOUT CONTRAST TECHNIQUE: Contiguous axial images were obtained from the base of the skull through the vertex without intravenous contrast. RADIATION DOSE REDUCTION: This exam was performed according to the departmental dose-optimization program which includes automated exposure control, adjustment of the mA and/or kV according to patient size and/or use of iterative reconstruction technique. COMPARISON:  None Available. FINDINGS: Brain: Extremely hyperdense right MCA M1 segment on series 2, image 11. And cytotoxic edema with loss of normal gray-white differentiation throughout most of the right MCA territory. ASPECTS 2. Partially effaced right lateral ventricle but no midline shift. No hemorrhagic transformation identified. Basilar cisterns remain patent. Left hemisphere gray-white differentiation appears maintained. There is patchy bilateral periventricular white matter hypodensity. Chronic appearing small right lateral cerebellar infarct. Other posterior fossa gray-white differentiation within normal limits. Vascular: Faint Calcified atherosclerosis at the skull base. Hyperdense right MCA M1 beginning just distal to its origin. Skull: No acute osseous abnormality identified. Sinuses/Orbits: Visualized paranasal sinuses and mastoids are clear. Other: Mildly Disconjugate gaze.  Negative scalp. ASPECTS [facility] Stroke Program Early CT Score) - Ganglionic level infarction (caudate, lentiform nuclei, internal capsule, insula, M1-M3 cortex): 1 - Supraganglionic  infarction (M4-M6 cortex): 1 Total score (0-10 with 10 being normal): 2 IMPRESSION: 1. Large Right MCA infarct. ASPECTS 2. No hemorrhagic transformation, and only minor intracranial mass effect at this time with no midline shift. 2. Underlying bilateral white matter disease and chronic appearing right cerebellar infarct. 3. These results were communicated to Dr. Otelia Limes at 4:26 am on 10/16/2022 by text page via the [facility] messaging system. Electronically Signed   By: Odessa Fleming M.D.   On: 10/16/2022 04:27     Assessment: 57 year old female presenting with acute onset of left hemiplegia - Exam reveals left hemiplegia, left hemineglect, left hemisensory loss, right gaze deviation, dysarthria and left visual field cut. NIHSS 20 - CT head: Large Right MCA infarct. ASPECTS 2. No hemorrhagic transformation, and only minor intracranial mass effect at this time with no midline shift. Underlying bilateral white matter disease and chronic appearing right cerebellar infarct. - Not a candidate for TNK given involvement of > 1/3 of the MCA territory with high risk of hemorrhagic transformation. Based on the extent of the hypodensity, with essentially all or nearly all of the right MCA territory being infarcted, there is unlikely to be any appreciable penumbra to be saved with IV or mechanical thrombolysis. Unacceptable risk of hemorrhagic transformation from luxury perfusion in the event of mechanical thrombectomy.  - At high risk of developing significant edema with mass effect and possible herniation. Discussed with fiance that there is also a relatively high risk for death during this admission. Patient has given healthcare staff permission to discuss her condition with her fiance. Her father is next of kin for medical decision making and fiance states that he will bring father in later today.  - CTA will not provide information that will change management in terms of emergent interventions.   Recommendations: - UDS  pending - Hypertonic saline at 50 cc/hr (ordered) - Neurosurgery has been consulted for consideration of possible right hemicraniectomy. - SBP goal < 160. No appreciable penumbra requiring higher perfusion pressure based on CT appearance.  - HgbA1c, fasting lipid panel - MRI brain with MRA head when stable - PT consult, OT consult, Speech consult - TTE - Carotid ultrasound - Hold off on antiplatelet med for now due to high risk of hemorrhagic conversion and possible need for hemicraniectomy.  - Risk  factor modification to be discussed later this admission if she survives - Telemetry monitoring - Frequent neuro checks - NPO until passes stroke swallow screen.  - Repeat CT head in 12 hours (ordered).     Electronically signed: Dr. Kerney Elbe 10/16/2022, 4:29 AM

## 2022-10-16 NOTE — Anesthesia Preprocedure Evaluation (Signed)
Anesthesia Evaluation  Patient identified by MRN, date of birth, ID band Patient confused    Reviewed: Allergy & Precautions, NPO status , Patient's Chart, lab work & pertinent test results  Airway Mallampati: II  TM Distance: >3 FB Neck ROM: Full    Dental  (+) Dental Advisory Given, Poor Dentition, Missing   Pulmonary Current Smoker   Pulmonary exam normal breath sounds clear to auscultation       Cardiovascular hypertension, Pt. on medications Normal cardiovascular exam Rhythm:Regular Rate:Normal     Neuro/Psych CVA    GI/Hepatic negative GI ROS,,,(+)     substance abuse  cocaine use  Endo/Other  negative endocrine ROS    Renal/GU negative Renal ROS     Musculoskeletal negative musculoskeletal ROS (+)    Abdominal   Peds  Hematology negative hematology ROS (+)   Anesthesia Other Findings Day of surgery medications reviewed with the patient.  Reproductive/Obstetrics                             Anesthesia Physical Anesthesia Plan  ASA: 4 and emergent  Anesthesia Plan: General   Post-op Pain Management: Ofirmev IV (intra-op)*   Induction: Intravenous  PONV Risk Score and Plan: 2 and Dexamethasone and Ondansetron  Airway Management Planned: Oral ETT  Additional Equipment: Arterial line  Intra-op Plan:   Post-operative Plan: Extubation in OR  Informed Consent: I have reviewed the patients History and Physical, chart, labs and discussed the procedure including the risks, benefits and alternatives for the proposed anesthesia with the patient or authorized representative who has indicated his/her understanding and acceptance.     Dental advisory given  Plan Discussed with: CRNA  Anesthesia Plan Comments:        Anesthesia Quick Evaluation

## 2022-10-16 NOTE — Plan of Care (Signed)
57 year old female with PMH HTN, pre-diabetes. Presented 1/13 with left facial droop and slurred speech. CT head with large right MCA with minimal intracranial mass effect. Not a candidate for TNK or thrombectomy. NSY consulted. Taken for hemicrani.   On arrival to ICU. Patient with loud snoring, eyelid apraxia, when awakens follows commands on right hemibody. Left with no motor movement.   Plan - CTA Head in AM  - BP goal <150 per NSY  - Plans to continue hypertonic saline. Stop if sodium >155. No plans to increase at this time.

## 2022-10-16 NOTE — Transfer of Care (Signed)
Immediate Anesthesia Transfer of Care Note  Patient: Sheri Beck  Procedure(s) Performed: RIGHT CRANIECTOMY (Right: Head)  Patient Location: PACU  Anesthesia Type:General  Level of Consciousness: awake, alert , and oriented  Airway & Oxygen Therapy: Patient Spontanous Breathing and Patient connected to nasal cannula oxygen  Post-op Assessment: Report given to RN, Post -op Vital signs reviewed and stable, and Patient moving all extremities  Post vital signs: Reviewed and stable  Last Vitals:  Vitals Value Taken Time  BP 108/77 10/16/22 1245  Temp    Pulse 66 10/16/22 1249  Resp 17 10/16/22 1249  SpO2 93 % 10/16/22 1249  Vitals shown include unvalidated device data.  Last Pain:  Vitals:   10/16/22 0822  TempSrc: Temporal         Complications: No notable events documented.

## 2022-10-16 NOTE — Anesthesia Postprocedure Evaluation (Signed)
Anesthesia Post Note  Patient: Sheri Beck  Procedure(s) Performed: RIGHT CRANIECTOMY (Right: Head)     Patient location during evaluation: PACU Anesthesia Type: General Level of consciousness: awake and alert Pain management: pain level controlled Vital Signs Assessment: post-procedure vital signs reviewed and stable Respiratory status: spontaneous breathing, nonlabored ventilation, respiratory function stable and patient connected to nasal cannula oxygen Cardiovascular status: blood pressure returned to baseline and stable Postop Assessment: no apparent nausea or vomiting Anesthetic complications: no   No notable events documented.  Last Vitals:  Vitals:   10/16/22 1300 10/16/22 1315  BP: 109/79 114/82  Pulse: 64 68  Resp: 15 13  Temp:    SpO2: 94% 94%    Last Pain:  Vitals:   10/16/22 1300  TempSrc:   PainSc: [place]

## 2022-10-16 NOTE — ED Triage Notes (Signed)
Pt bib [facility] as a code stroke. Per pt husband, pt watching tv in living room when husband noted L facial droop and slurred speech. Same noted upon arrival + L side weakness and lethargy. Pupils noted to be unequal but reactive.

## 2022-10-16 NOTE — Progress Notes (Signed)
SLP Cancellation Note  Patient Details Name: CARLITA WHITCOMB MRN: [id number] DOB: 1966/03/10   Cancelled eval: pt in OR. Will follow as appropriate.  Babygirl Trager L. Tivis Ringer, MA CCC/SLP Clinical Specialist - Acute Care SLP Acute Rehabilitation Services Office number [phone]            Juan Quam Laurice 10/16/2022, 12:42 PM

## 2022-10-16 NOTE — ED Provider Notes (Signed)
[facility] EMERGENCY DEPARTMENT Provider Note   CSN: [id number] Arrival date & time: 10/16/22  0401  An emergency department physician performed an initial assessment on this suspected stroke patient at 0401.  History  Chief Complaint  Patient presents with   Code Stroke    Sheri Beck is a 57 y.o. female.  The history is provided by the EMS personnel. The history is limited by the condition of the patient.  Cerebrovascular Accident This is a new problem. The current episode started 3 to 5 hours ago. The problem occurs constantly. The problem has not changed since onset.Pertinent negatives include no chest pain and no abdominal pain. Nothing aggravates the symptoms. Nothing relieves the symptoms. She has tried nothing for the symptoms. The treatment provided no relief.  Patient with HTN presents with facial droop speech deficit and LU/LLE weakness since reportedly 230 am.       Home Medications Prior to Admission medications   Medication Sig Start Date End Date Taking? Authorizing Provider  amLODipine (NORVASC) 10 MG tablet Take 1 tablet by mouth once daily 10/05/22   Farrel Conners, MD  aspirin 325 MG EC tablet Take 325 mg by mouth every 6 (six) hours as needed for pain.    [provider]  lisinopril-hydrochlorothiazide (ZESTORETIC) 20-12.5 MG tablet Take 1 tablet by mouth once daily 10/05/22   Farrel Conners, MD  naproxen (NAPROSYN) 375 MG tablet Take 1 tablet (375 mg total) by mouth 2 (two) times daily. 04/16/22   Scot Jun, FNP      Allergies    Patient has no known allergies.    Review of Systems   Review of Systems  Unable to perform ROS: Acuity of condition  Cardiovascular:  Negative for chest pain.  Gastrointestinal:  Negative for abdominal pain.  Neurological:  Positive for facial asymmetry, speech difficulty and weakness.    Physical Exam Updated Vital Signs BP (!) 113/91   Pulse 71   Resp (!) 21   SpO2 97%   Physical Exam Vitals and nursing note reviewed. Exam conducted with a chaperone present.  Constitutional:      General: She is not in acute distress.    Appearance: She is well-developed.  HENT:     Head: Normocephalic and atraumatic.     Nose: Nose normal.     Mouth/Throat:     Mouth: Mucous membranes are moist.  Eyes:     Pupils: Pupils are equal, round, and reactive to light.  Cardiovascular:     Rate and Rhythm: Normal rate and regular rhythm.     Pulses: Normal pulses.     Heart sounds: Normal heart sounds.  Pulmonary:     Effort: Pulmonary effort is normal. No respiratory distress.     Breath sounds: Normal breath sounds.  Abdominal:     General: Bowel sounds are normal. There is no distension.     Palpations: Abdomen is soft.     Tenderness: There is no abdominal tenderness. There is no guarding or rebound.  Genitourinary:    Vagina: No vaginal discharge.  Musculoskeletal:        General: Normal range of motion.     Cervical back: Neck supple.  Skin:    General: Skin is dry.     Capillary Refill: Capillary refill takes less than 2 seconds.     Findings: No erythema or rash.  Neurological:     Cranial Nerves: Cranial nerve deficit present.     Sensory:  Sensory deficit present.     Motor: Weakness present.     Comments: Speech deficit and facial droop, no movements of the left leg, 4/5 LUE  Psychiatric:        Mood and Affect: Mood normal.     ED Results / Procedures / Treatments   Labs (all labs ordered are listed, but only abnormal results are displayed) Results for orders placed or performed during the hospital encounter of 10/16/22  Ethanol  Result Value Ref Range   Alcohol, Ethyl (B) <10 <10 mg/dL  CBC  Result Value Ref Range   WBC 11.3 (H) 4.0 - 10.5 K/uL   RBC 5.76 (H) 3.87 - 5.11 MIL/uL   Hemoglobin 17.1 (H) 12.0 - 15.0 g/dL   HCT 51.3 (H) 36.0 - 46.0 %   MCV 89.1 80.0 - 100.0 fL   MCH 29.7 26.0 - 34.0 pg   MCHC 33.3 30.0 - 36.0 g/dL   RDW 14.2  11.5 - 15.5 %   Platelets 245 150 - 400 K/uL   nRBC 0.0 0.0 - 0.2 %  Differential  Result Value Ref Range   Neutrophils Relative % 81 %   Neutro Abs 9.1 (H) 1.7 - 7.7 K/uL   Lymphocytes Relative 15 %   Lymphs Abs 1.7 0.7 - 4.0 K/uL   Monocytes Relative 3 %   Monocytes Absolute 0.3 0.1 - 1.0 K/uL   Eosinophils Relative 0 %   Eosinophils Absolute 0.0 0.0 - 0.5 K/uL   Basophils Relative 0 %   Basophils Absolute 0.1 0.0 - 0.1 K/uL   Immature Granulocytes 1 %   Abs Immature Granulocytes 0.07 0.00 - 0.07 K/uL  I-stat chem 8, ED  Result Value Ref Range   Sodium 138 135 - 145 mmol/L   Potassium 7.0 (HH) 3.5 - 5.1 mmol/L   Chloride 109 98 - 111 mmol/L   BUN 42 (H) 6 - 20 mg/dL   Creatinine, Ser 0.90 0.44 - 1.00 mg/dL   Glucose, Bld 161 (H) 70 - 99 mg/dL   Calcium, Ion 1.00 (L) 1.15 - 1.40 mmol/L   TCO2 22 22 - 32 mmol/L   Hemoglobin 17.3 (H) 12.0 - 15.0 g/dL   HCT 51.0 (H) 36.0 - 46.0 %   Comment NOTIFIED PHYSICIAN   I-Stat beta hCG blood, ED  Result Value Ref Range   I-stat hCG, quantitative <5.0 <5 mIU/mL   Comment 3          I-stat chem 8, ed  Result Value Ref Range   Sodium 141 135 - 145 mmol/L   Potassium 3.8 3.5 - 5.1 mmol/L   Chloride 113 (H) 98 - 111 mmol/L   BUN 29 (H) 6 - 20 mg/dL   Creatinine, Ser 0.90 0.44 - 1.00 mg/dL   Glucose, Bld 138 (H) 70 - 99 mg/dL   Calcium, Ion 0.93 (L) 1.15 - 1.40 mmol/L   TCO2 16 (L) 22 - 32 mmol/L   Hemoglobin 18.0 (H) 12.0 - 15.0 g/dL   HCT 53.0 (H) 36.0 - 46.0 %   CT HEAD CODE STROKE WO CONTRAST  Result Date: 10/16/2022 CLINICAL DATA:  Code stroke. 57 year old female with left side deficits. EXAM: CT HEAD WITHOUT CONTRAST TECHNIQUE: Contiguous axial images were obtained from the base of the skull through the vertex without intravenous contrast. RADIATION DOSE REDUCTION: This exam was performed according to the departmental dose-optimization program which includes automated exposure control, adjustment of the mA and/or kV according to  patient size and/or use of iterative  reconstruction technique. COMPARISON:  None Available. FINDINGS: Brain: Extremely hyperdense right MCA M1 segment on series 2, image 11. And cytotoxic edema with loss of normal gray-white differentiation throughout most of the right MCA territory. ASPECTS 2. Partially effaced right lateral ventricle but no midline shift. No hemorrhagic transformation identified. Basilar cisterns remain patent. Left hemisphere gray-white differentiation appears maintained. There is patchy bilateral periventricular white matter hypodensity. Chronic appearing small right lateral cerebellar infarct. Other posterior fossa gray-white differentiation within normal limits. Vascular: Faint Calcified atherosclerosis at the skull base. Hyperdense right MCA M1 beginning just distal to its origin. Skull: No acute osseous abnormality identified. Sinuses/Orbits: Visualized paranasal sinuses and mastoids are clear. Other: Mildly Disconjugate gaze.  Negative scalp. ASPECTS [facility] Stroke Program Early CT Score) - Ganglionic level infarction (caudate, lentiform nuclei, internal capsule, insula, M1-M3 cortex): 1 - Supraganglionic infarction (M4-M6 cortex): 1 Total score (0-10 with 10 being normal): 2 IMPRESSION: 1. Large Right MCA infarct. ASPECTS 2. No hemorrhagic transformation, and only minor intracranial mass effect at this time with no midline shift. 2. Underlying bilateral white matter disease and chronic appearing right cerebellar infarct. 3. These results were communicated to Dr. Otelia Limes at 4:26 am on 10/16/2022 by text page via the [facility] messaging system. Electronically Signed   By: Odessa Fleming M.D.   On: 10/16/2022 04:27   MM 3D SCREEN BREAST BILATERAL  Result Date: 10/06/2022 CLINICAL DATA:  Screening. EXAM: DIGITAL SCREENING BILATERAL MAMMOGRAM WITH TOMOSYNTHESIS AND CAD TECHNIQUE: Bilateral screening digital craniocaudal and mediolateral oblique mammograms were obtained. Bilateral screening digital  breast tomosynthesis was performed. The images were evaluated with computer-aided detection. COMPARISON:  Previous exam(s). ACR Breast Density Category b: There are scattered areas of fibroglandular density. FINDINGS: There are no findings suspicious for malignancy. IMPRESSION: No mammographic evidence of malignancy. A result letter of this screening mammogram will be mailed directly to the patient. RECOMMENDATION: Screening mammogram in one year. (Code:SM-B-01Y) BI-RADS CATEGORY  1: Negative. Electronically Signed   By: Edwin Cap M.D.   On: 10/06/2022 08:03    Radiology CT HEAD CODE STROKE WO CONTRAST  Result Date: 10/16/2022 CLINICAL DATA:  Code stroke. 57 year old female with left side deficits. EXAM: CT HEAD WITHOUT CONTRAST TECHNIQUE: Contiguous axial images were obtained from the base of the skull through the vertex without intravenous contrast. RADIATION DOSE REDUCTION: This exam was performed according to the departmental dose-optimization program which includes automated exposure control, adjustment of the mA and/or kV according to patient size and/or use of iterative reconstruction technique. COMPARISON:  None Available. FINDINGS: Brain: Extremely hyperdense right MCA M1 segment on series 2, image 11. And cytotoxic edema with loss of normal gray-white differentiation throughout most of the right MCA territory. ASPECTS 2. Partially effaced right lateral ventricle but no midline shift. No hemorrhagic transformation identified. Basilar cisterns remain patent. Left hemisphere gray-white differentiation appears maintained. There is patchy bilateral periventricular white matter hypodensity. Chronic appearing small right lateral cerebellar infarct. Other posterior fossa gray-white differentiation within normal limits. Vascular: Faint Calcified atherosclerosis at the skull base. Hyperdense right MCA M1 beginning just distal to its origin. Skull: No acute osseous abnormality identified. Sinuses/Orbits:  Visualized paranasal sinuses and mastoids are clear. Other: Mildly Disconjugate gaze.  Negative scalp. ASPECTS [facility] Stroke Program Early CT Score) - Ganglionic level infarction (caudate, lentiform nuclei, internal capsule, insula, M1-M3 cortex): 1 - Supraganglionic infarction (M4-M6 cortex): 1 Total score (0-10 with 10 being normal): 2 IMPRESSION: 1. Large Right MCA infarct. ASPECTS 2. No hemorrhagic transformation, and only minor intracranial mass  effect at this time with no midline shift. 2. Underlying bilateral white matter disease and chronic appearing right cerebellar infarct. 3. These results were communicated to Dr. Otelia Limes at 4:26 am on 10/16/2022 by text page via the [facility] messaging system. Electronically Signed   By: Odessa Fleming M.D.   On: 10/16/2022 04:27    Procedures Procedures    Medications Ordered in ED Medications - No data to display  ED Course/ Medical Decision Making/ A&P                             Medical Decision Making Stoke like symptoms since 230 am, unable to move left side with thick speech   Amount and/or Complexity of Data Reviewed Independent Historian: EMS    Details: See above  External Data Reviewed: notes.    Details: Previous notes reviewed  Labs: ordered.    Details: All labs reviewed: white count slight elevation 11.3 elevated hemoglobin 17.1, normal platelet count.  Normal coags,  normal sodium 141 and potassium 3.8, normal creatinine.  Negative ETOH.   Radiology: ordered and independent interpretation performed.    Details: Large MCA stroke on CT Discussion of management or test interpretation with external provider(s): Case d/w Dr. Otelia Limes please admit to ICU d/t Mental status and please call neurosurgery  Case d/w Dr. Tonia Brooms, who will see the patient and is awaiting input from NSG Case d/w Dr. Lovell Sheehan. He will see the patient when he rounds in the am.  States this could have waited until am to be called  Risk Decision regarding  hospitalization.  Critical Care Total time providing critical care: 30 minutes (Multiple consults, high degree of complexity of care and potential for serious outcomes and multiple reevaluations )   CRITICAL CARE Performed by: Christie Copley K Neeko Pharo-Rasch Total critical care time: 30 minutes Critical care time was exclusive of separately billable procedures and treating other patients. Critical care was necessary to treat or prevent imminent or life-threatening deterioration. Critical care was time spent personally by me on the following activities: development of treatment plan with patient and/or surrogate as well as nursing, discussions with consultants, evaluation of patient's response to treatment, examination of patient, obtaining history from patient or surrogate, ordering and performing treatments and interventions, ordering and review of laboratory studies, ordering and review of radiographic studies, pulse oximetry and re-evaluation of patient's condition.  Final Clinical Impression(s) / ED Diagnoses Final diagnoses:  Cerebrovascular accident (CVA), unspecified mechanism (HCC)   The patient appears reasonably stabilized for admission considering the current resources, flow, and capabilities available in the ED at this time, and I doubt any other [facility] requiring further screening and/or treatment in the ED prior to admission.  Rx / DC Orders ED Discharge Orders     None         Haasini Patnaude, MD 10/16/22 9169

## 2022-10-17 ENCOUNTER — Encounter (HOSPITAL_COMMUNITY): Payer: Self-pay | Admitting: Neurosurgery

## 2022-10-17 ENCOUNTER — Inpatient Hospital Stay: Payer: Self-pay

## 2022-10-17 ENCOUNTER — Inpatient Hospital Stay (HOSPITAL_COMMUNITY): Payer: Medicaid Other

## 2022-10-17 DIAGNOSIS — I63311 Cerebral infarction due to thrombosis of right middle cerebral artery: Secondary | ICD-10-CM

## 2022-10-17 DIAGNOSIS — I63511 Cerebral infarction due to unspecified occlusion or stenosis of right middle cerebral artery: Secondary | ICD-10-CM | POA: Diagnosis not present

## 2022-10-17 LAB — BASIC METABOLIC PANEL
Anion gap: 6 (ref 5–15)
BUN: 16 mg/dL (ref 6–20)
CO2: 24 mmol/L (ref 22–32)
Calcium: 8 mg/dL — ABNORMAL LOW (ref 8.9–10.3)
Chloride: 120 mmol/L — ABNORMAL HIGH (ref 98–111)
Creatinine, Ser: 0.8 mg/dL (ref 0.44–1.00)
GFR, Estimated: >60 mL/min (ref >60)
Glucose, Bld: 135 mg/dL — ABNORMAL HIGH (ref 70–99)
Potassium: 3.9 mmol/L (ref 3.5–5.1)
Sodium: 150 mmol/L — ABNORMAL HIGH (ref 135–145)

## 2022-10-17 LAB — GLUCOSE, CAPILLARY
Glucose-Capillary: 112 mg/dL — ABNORMAL HIGH (ref 70–99)
Glucose-Capillary: 130 mg/dL — ABNORMAL HIGH (ref 70–99)
Glucose-Capillary: 133 mg/dL — ABNORMAL HIGH (ref 70–99)
Glucose-Capillary: 136 mg/dL — ABNORMAL HIGH (ref 70–99)
Glucose-Capillary: 141 mg/dL — ABNORMAL HIGH (ref 70–99)
Glucose-Capillary: 151 mg/dL — ABNORMAL HIGH (ref 70–99)
Glucose-Capillary: 155 mg/dL — ABNORMAL HIGH (ref 70–99)

## 2022-10-17 LAB — CBC
HCT: 36.4 % (ref 36.0–46.0)
Hemoglobin: 12.5 g/dL (ref 12.0–15.0)
MCH: 29.9 pg (ref 26.0–34.0)
MCHC: 34.3 g/dL (ref 30.0–36.0)
MCV: 87.1 fL (ref 80.0–100.0)
Platelets: 200 10*3/uL (ref 150–400)
RBC: 4.18 MIL/uL (ref 3.87–5.11)
RDW: 14.3 % (ref 11.5–15.5)
WBC: 17.2 10*3/uL — ABNORMAL HIGH (ref 4.0–10.5)
nRBC: 0 % (ref 0.0–0.2)

## 2022-10-17 LAB — HIV ANTIBODY (ROUTINE TESTING W REFLEX): HIV Screen 4th Generation wRfx: NONREACTIVE

## 2022-10-17 LAB — SODIUM
Sodium: 148 mmol/L — ABNORMAL HIGH (ref 135–145)
Sodium: 148 mmol/L — ABNORMAL HIGH (ref 135–145)
Sodium: 148 mmol/L — ABNORMAL HIGH (ref 135–145)

## 2022-10-17 LAB — MAGNESIUM: Magnesium: 2.2 mg/dL (ref 1.7–2.4)

## 2022-10-17 LAB — PHOSPHORUS: Phosphorus: 2.8 mg/dL (ref 2.5–4.6)

## 2022-10-17 MED ORDER — SODIUM CHLORIDE 0.9% FLUSH: 10.0000 mL | INTRAVENOUS | Status: DC | PRN

## 2022-10-17 MED ORDER — IOHEXOL 350 MG/ML SOLN
75.0000 mL | Freq: Once | INTRAVENOUS | Status: AC | PRN
  Administered 2022-10-17: 75 mL via INTRAVENOUS

## 2022-10-17 MED ORDER — SODIUM CHLORIDE 0.9% FLUSH
10.0000 mL | Freq: Two times a day (BID) | INTRAVENOUS | Status: DC
  Administered 2022-10-17 – 2022-10-18 (×2): 10 mL
  Administered 2022-10-18: 20 mL
  Administered 2022-10-19 – 2022-10-23 (×7): 10 mL

## 2022-10-17 MED ORDER — SODIUM CHLORIDE 3 % IV SOLN
INTRAVENOUS | Status: DC
  Filled 2022-10-17 (×4): qty 500

## 2022-10-17 MED ORDER — HEPARIN SODIUM (PORCINE) 5000 UNIT/ML IJ SOLN
5000.0000 [IU] | Freq: Three times a day (TID) | INTRAMUSCULAR | Status: DC
  Administered 2022-10-17 – 2022-10-20 (×8): 5000 [IU] via SUBCUTANEOUS
  Filled 2022-10-17 (×8): qty 1

## 2022-10-17 NOTE — Progress Notes (Signed)
Peripherally Inserted Central Catheter Placement  The IV Nurse has discussed with the patient and/or persons authorized to consent for the patient, the purpose of this procedure and the potential benefits and risks involved with this procedure.  The benefits include less needle sticks, lab draws from the catheter, and the patient may be discharged home with the catheter. Risks include, but not limited to, infection, bleeding, blood clot (thrombus formation), and puncture of an artery; nerve damage and irregular heartbeat and possibility to perform a PICC exchange if needed/ordered by physician.  Alternatives to this procedure were also discussed.  Bard Power PICC patient education guide, fact sheet on infection prevention and patient information card has been provided to patient /or left at bedside. Telephone consent obtained from pt's niece, Chanel.     PICC Placement Documentation  PICC Double Lumen 62/22/97 Right Basilic 38 cm 1 cm (Active)  Indication for Insertion or Continuance of Line Vasoactive infusions 10/17/22 1755  Exposed Catheter (cm) 1 cm 10/17/22 1755  Site Assessment Clean, Dry, Intact 10/17/22 1755  Lumen #1 Status Saline locked;Flushed;Blood return noted 10/17/22 1755  Lumen #2 Status Saline locked;Flushed;Blood return noted 10/17/22 1755  Dressing Type Transparent;Securing device 10/17/22 1755  Dressing Status Antimicrobial disc in place;Clean, Dry, Intact 10/17/22 1755  Safety Lock Not Applicable 98/92/11 9417  Line Care Connections checked and tightened 10/17/22 1755  Line Adjustment (NICU/IV Team Only) No 10/17/22 1755  Dressing Intervention New dressing 10/17/22 1755  Dressing Change Due 10/24/22 10/17/22 [place] 10/17/2022, 5:56 PM

## 2022-10-17 NOTE — Evaluation (Signed)
Occupational Therapy Evaluation Patient Details Name: Sheri Beck MRN: [id number] DOB: [birth year] Today's Date: 10/17/2022   History of Present Illness 57 yo female presents to [facility] on 1/13 with L facial droop, slurred speech, L weakness, UDS + cocaine. CTH shows large R MCA CVA. S/p R frontal temporal parietal decompressive craniectomy, insertion of craniotomy flap in abdomen on 1/13. PMHx: HTN, HLD, substance use   Clinical Impression   Sandrea was evaluated s/p the above admission list, she is indep at baseline, lives in a 2 level apt and loves to go to the grocery store. Upon evaluation she had functional limitations due to low LOA, lethargy, L hemiplegia, L visual field deficits with R gaze, impaired cognition, and poor activity tolerance. Overall she is dependent at bed level, bed moved to chair position with gradual decreased LOA as session progressed. She requires max A +2 for all UB ADLs and total A for LB ADLs. Unable to visual cross midline and only brief moments of midline attention with max cues. She may benefit from occlusion glasses. OT to continue to follow acutely. Recommend d/c to AIR for maximal functional progress.      Recommendations for follow up therapy are one component of a multi-disciplinary discharge planning process, led by the attending physician.  Recommendations may be updated based on patient status, additional functional criteria and insurance authorization.   Follow Up Recommendations  Acute inpatient rehab (3hours/day)     Assistance Recommended at Discharge Frequent or constant Supervision/Assistance  Patient can return home with the following A lot of help with walking and/or transfers;Two people to help with walking and/or transfers;A lot of help with bathing/dressing/bathroom;Two people to help with bathing/dressing/bathroom;Assistance with cooking/housework;Assistance with feeding;Direct supervision/assist for medications management;Direct supervision/assist  for financial management;Assist for transportation;Help with stairs or ramp for entrance    Functional Status Assessment  Patient has had a recent decline in their functional status and/or demonstrates limited ability to make significant improvements in function in a reasonable and predictable amount of time  Equipment Recommendations  Other (comment) (defer)    Recommendations for Other Services Rehab consult     Precautions / Restrictions Precautions Precautions: Fall Precaution Comments: no R skull flap, abdominal flap, head JP Restrictions Weight Bearing Restrictions: No      Mobility Bed Mobility Overal bed mobility: Needs Assistance             General bed mobility comments: dependent for all bed adjustments - session completed while supported upright in chair position    Transfers Overall transfer level: Needs assistance                 General transfer comment: deferred      Balance                                           ADL either performed or assessed with clinical judgement   ADL Overall ADL's : Needs assistance/impaired Eating/Feeding: Maximal assistance;Bed level Eating/Feeding Details (indicate cue type and reason): supported upright in bed Grooming: Maximal assistance;Bed level   Upper Body Bathing: Maximal assistance;Bed level   Lower Body Bathing: Total assistance   Upper Body Dressing : Maximal assistance;Bed level   Lower Body Dressing: Total assistance   Toilet Transfer: Total assistance   Toileting- Clothing Manipulation and Hygiene: Total assistance       Functional mobility during ADLs: Total assistance General  ADL Comments: bed level assessment due to LOA. limited by L hemi, L neglect vs hemianopsia     Vision Baseline Vision/History: 0 No visual deficits Vision Assessment?: Vision impaired- to be further tested in functional context;Yes Ocular Range of Motion: Restricted on the  left Alignment/Gaze Preference: Gaze right Visual Fields: Left visual field deficit Additional Comments: difficult to assess due to LOA. able to briefly attend to midline with maximal cues     Perception Perception Perception Tested?: No   Praxis Praxis Praxis tested?: Not tested    Pertinent Vitals/Pain Pain Assessment Pain Assessment: Faces Faces Pain Scale: Hurts even more Pain Location: head Pain Descriptors / Indicators: Discomfort Pain Intervention(s): Limited activity within patient's tolerance, Monitored during session     Hand Dominance Right   Extremity/Trunk Assessment Upper Extremity Assessment Upper Extremity Assessment: RUE deficits/detail;LUE deficits/detail RUE Deficits / Details: limited assessment but seemingly WFL. RUE Sensation: WNL RUE Coordination: WNL LUE Deficits / Details: flaccid, increased tone. no response to noxious stim LUE Sensation: decreased light touch;decreased proprioception LUE Coordination: decreased fine motor;decreased gross motor   Lower Extremity Assessment Lower Extremity Assessment: Defer to PT evaluation   Cervical / Trunk Assessment Cervical / Trunk Assessment: Normal   Communication Communication Communication: No difficulties   Cognition Arousal/Alertness: Lethargic Behavior During Therapy: Flat affect Overall Cognitive Status: Difficult to assess                                 General Comments: Oriented and following R sided cues with max cues. limited by lethargy and low LOA progressing throughout session. Will benefit from higher level cog assessment when able     General Comments  VSS, spouse present and supportive. Per RN report, pt cleared for bed/EOB evaluation, but no OOB yet    Exercises     Shoulder Instructions      Home Living Family/patient expects to be discharged to:: Private residence Living Arrangements: Spouse/significant other Available Help at Discharge: Family;Available 24  hours/day Type of Home: Apartment Home Access: Level entry     Home Layout: Two level;Bed/bath upstairs Alternate Level Stairs-Number of Steps: flight   Bathroom Shower/Tub: Teacher, early years/pre: Standard     Home Equipment: None      Lives With: Significant other    Prior Functioning/Environment Prior Level of Function : Independent/Modified Independent             Mobility Comments: no AD ADLs Comments: drives without license, enjoys going to grocery store        OT Problem List: Decreased strength;Decreased range of motion;Decreased activity tolerance;Impaired balance (sitting and/or standing);Decreased cognition;Decreased safety awareness;Decreased knowledge of use of DME or AE;Decreased knowledge of precautions;Impaired UE functional use;Pain;Impaired sensation;Impaired tone;Impaired vision/perception;Decreased coordination      OT Treatment/Interventions: Self-care/ADL training;Therapeutic exercise;Neuromuscular education;DME and/or AE instruction;Therapeutic activities;Patient/family education;Balance training    OT Goals(Current goals can be found in the care plan section) Acute Rehab OT Goals Patient Stated Goal: to drink gingerale OT Goal Formulation: With patient Time For Goal Achievement: 10/31/22 Potential to Achieve Goals: Good ADL Goals Pt Will Perform Grooming: with mod assist;sitting Pt Will Perform Upper Body Dressing: with mod assist;sitting Pt Will Transfer to Toilet: with max assist;stand pivot transfer;bedside commode Additional ADL Goal #1: pt will locate at least 3 ADL items in her L environment with max cues in preparation for activity Additional ADL Goal #2: Pt will complete bed mobiliy with  mod A as a precursor to ADLs  OT Frequency: Min 2X/week    Co-evaluation PT/OT/SLP Co-Evaluation/Treatment: Yes Reason for Co-Treatment: For patient/therapist safety;To address functional/ADL transfers;Complexity of the patient's  impairments (multi-system involvement)   OT goals addressed during session: ADL's and self-care      AM-PAC OT "6 Clicks" Daily Activity     Outcome Measure Help from another person eating meals?: A Lot Help from another person taking care of personal grooming?: A Lot Help from another person toileting, which includes using toliet, bedpan, or urinal?: Total Help from another person bathing (including washing, rinsing, drying)?: A Lot Help from another person to put on and taking off regular upper body clothing?: A Lot Help from another person to put on and taking off regular lower body clothing?: Total 6 Click Score: 10   End of Session Nurse Communication: Mobility status  Activity Tolerance: Patient limited by lethargy Patient left: in bed;with call bell/phone within reach;with bed alarm set;with family/visitor present  OT Visit Diagnosis: Unsteadiness on feet (R26.81);Other abnormalities of gait and mobility (R26.89);Repeated falls (R29.6);Muscle weakness (generalized) (M62.81);Pain;Hemiplegia and hemiparesis Hemiplegia - Right/Left: Left Hemiplegia - dominant/non-dominant: Non-Dominant Hemiplegia - caused by: Cerebral infarction                Time: 6301-6010 OT Time Calculation (min): 21 min Charges:  OT General Charges $OT Visit: 1 Visit OT Evaluation $OT Eval Moderate Complexity: 1 Mod   [place] D Causey 10/17/2022, 1:32 PM

## 2022-10-17 NOTE — Progress Notes (Signed)
Subjective: The patient is in no apparent distress.  Objective: Vital signs in last 24 hours: Temp:  [96 F (35.6 C)-98.7 F (37.1 C)] 98.2 F (36.8 C) (01/14 0300) Pulse Rate:  [62-91] 73 (01/14 0700) Resp:  [12-22] 17 (01/14 0700) BP: (109-157)/(64-105) 134/105 (01/14 0600) SpO2:  [91 %-100 %] 100 % (01/14 0700) Arterial Line BP: (68-150)/(52-71) 132/60 (01/14 0700) Weight:  [98.4 kg] 98.4 kg (01/13 0840) Estimated body mass index is 39.69 kg/m as calculated from the following:   Height as of this encounter: 5\' 2"  (1.575 m).   Weight as of this encounter: 98.4 kg.   Intake/Output from previous day: 01/13 0701 - 01/14 0700 In: 1908.6 [I.V.:1908.1; IV Piggyback:0.5] Out: 0865 [HQION:6295; Drains:175; Blood:150] Intake/Output this shift: No intake/output data recorded.  Physical exam the patient is mildly somnolent.  She is easily arousable.  She is oriented to person and [facility].  She is left hemiplegic.  She follows commands on the right.  Her pupils are equal.  The patient is a follow-up head CT performed today demonstrates her craniectomy and the expected progression/evolution of her right MCA infarct and swelling.  Lab Results: Recent Labs    10/16/22 0412 10/16/22 0413 10/16/22 0501 10/17/22 0550  WBC 11.3*  --   --  17.2*  HGB 17.1*   < > 18.0* 12.5  HCT 51.3*   < > 53.0* 36.4  PLT 245  --   --  200   < > = values in this interval not displayed.   BMET Recent Labs    10/16/22 0553 10/16/22 1407 10/17/22 0204 10/17/22 0550  NA 139   < > 148* 150*  K 4.7  --   --  3.9  CL 105  --   --  120*  CO2 23  --   --  24  GLUCOSE 118*  --   --  135*  BUN 27*  --   --  16  CREATININE 0.97  --   --  0.80  CALCIUM 8.9  --   --  8.0*   < > = values in this interval not displayed.    Studies/Results: CT ANGIO HEAD NECK W WO CM  Addendum Date: 10/17/2022   ADDENDUM REPORT: 10/17/2022 06:16 ADDENDUM: Study discussed by telephone with Dr. Cheral Marker at  [facility] am on 10/17/2022. Electronically Signed   By: Genevie Ann M.D.   On: 10/17/2022 06:16   Result Date: 10/17/2022 CLINICAL DATA:  57 year old female code stroke presentation yesterday morning. Large right MCA infarct. Postoperative day 1 decompressive craniotomy for right MCA brain edema. EXAM: CT ANGIOGRAPHY HEAD AND NECK TECHNIQUE: Multidetector CT imaging of the head and neck was performed using the standard protocol during bolus administration of intravenous contrast. Multiplanar CT image reconstructions and MIPs were obtained to evaluate the vascular anatomy. Carotid stenosis measurements (when applicable) are obtained utilizing NASCET criteria, using the distal internal carotid diameter as the denominator. RADIATION DOSE REDUCTION: This exam was performed according to the departmental dose-optimization program which includes automated exposure control, adjustment of the mA and/or kV according to patient size and/or use of iterative reconstruction technique. CONTRAST:  54mL OMNIPAQUE IOHEXOL 350 MG/ML SOLN COMPARISON:  Presentation head CT 10/16/2022. 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 Interval decompressive craniectomy with mild brain herniation through the skull defect (series 15, image 19). Increased mass effect on the right lateral ventricle, especially the frontal horn, but there is no leftward  midline shift. Basilar cisterns remain patent. Hyperdense right MCA and its branches. There are scattered small foci of petechial hemorrhage along the periphery of the infarcted right hemisphere (series 15, images 21, 22, 23). No malignant hemorrhagic transformation. Elsewhere gray-white differentiation is stable. Small chronic right cerebellar infarct. Calvarium and skull base: New long segment right craniectomy. Paranasal sinuses: Visualized paranasal sinuses and mastoids are stable and well aerated. Orbits: Postoperative changes to the right  scalp. Postoperative scalp drain in place. Overlying skin staples. Orbits soft tissues are stable, negative. CTA NECK Skeleton: Right craniectomy described above. No other No acute osseous abnormality identified. Upper chest: Negative, lung apices are fairly clear. Other neck: Pharynx motion artifact. No other acute finding in the neck. Aortic arch: 3 vessel arch configuration with minimal distal arch atherosclerosis. Right carotid system: Mildly tortuous brachiocephalic artery and right CCA origin. Motion artifact at the right carotid bifurcation. But no significant plaque or stenosis identified. Left carotid system: Similar mild tortuosity and left carotid bifurcation motion artifact but no plaque or stenosis is identified. Vertebral arteries: Proximal subclavian arteries and vertebral artery origins are normal. Codominant vertebral arteries with tortuous V1 segments, but no vertebral plaque or stenosis to the skull base. CTA HEAD Posterior circulation: Distal vertebral arteries and vertebrobasilar junction are normal. Normal left PICA origin and dominant appearing right AICA origin. Patent basilar artery without stenosis. Patent SCA and PCA origins. Posterior communicating arteries are diminutive or absent. Bilateral PCA branches are within normal limits. Anterior circulation: Both ICA siphons are patent. The left siphon appears normal to the carotid terminus. The right ICA siphon caliber is mildly diminished throughout. But there is no significant siphon plaque or stenosis. Right ICA terminus and right ACA origin remain patent but the MCA origin is occluded. There is minimal right MCA collateral enhancement. Contralateral left ICA terminus, left MCA and ACA origin are normal. A1 segments, anterior communicating artery, and bilateral ACA branches are within normal limits. Left MCA M1 segment and bifurcation are patent without stenosis. Left MCA branches are within normal limits. Venous sinuses: Early contrast  timing. The superior sagittal sinus, transverse and sigmoid sinuses appear grossly patent. Anatomic variants: None. Review of the MIP images confirms the above findings IMPRESSION: 1. Large Right MCA infarct with progressive cytotoxic edema, but no leftward midline shift following decompressive craniectomy. Basilar cisterns remain patent. 2. Mild petechial hemorrhage along the periphery of the infarct. But no malignant hemorrhagic transformation. 3. Confluent occlusion of the Right MCA at its origin. Right ICA terminus and Right ACA remain patent. And virtually NO atherosclerosis in the head or neck. Given other evidence of chronic cerebral ischemia (right cerebellum), consider the possibility of a PFO or similar thromboembolic mechanism. Salient findings were communicated to Dr. Cheral Marker at 5:47 am on 10/17/2022 by text page via the [facility] messaging system. Electronically Signed: By: Genevie Ann M.D. On: 10/17/2022 05:50   CT HEAD CODE STROKE WO CONTRAST  Result Date: 10/16/2022 CLINICAL DATA:  Code stroke. 57 year old female with left side deficits. EXAM: CT HEAD WITHOUT CONTRAST TECHNIQUE: Contiguous axial images were obtained from the base of the skull through the vertex without intravenous contrast. RADIATION DOSE REDUCTION: This exam was performed according to the departmental dose-optimization program which includes automated exposure control, adjustment of the mA and/or kV according to patient size and/or use of iterative reconstruction technique. COMPARISON:  None Available. FINDINGS: Brain: Extremely hyperdense right MCA M1 segment on series 2, image 11. And cytotoxic edema with loss of normal gray-white differentiation throughout  most of the right MCA territory. ASPECTS 2. Partially effaced right lateral ventricle but no midline shift. No hemorrhagic transformation identified. Basilar cisterns remain patent. Left hemisphere gray-white differentiation appears maintained. There is patchy bilateral  periventricular white matter hypodensity. Chronic appearing small right lateral cerebellar infarct. Other posterior fossa gray-white differentiation within normal limits. Vascular: Faint Calcified atherosclerosis at the skull base. Hyperdense right MCA M1 beginning just distal to its origin. Skull: No acute osseous abnormality identified. Sinuses/Orbits: Visualized paranasal sinuses and mastoids are clear. Other: Mildly Disconjugate gaze.  Negative scalp. ASPECTS [facility] Stroke Program Early CT Score) - Ganglionic level infarction (caudate, lentiform nuclei, internal capsule, insula, M1-M3 cortex): 1 - Supraganglionic infarction (M4-M6 cortex): 1 Total score (0-10 with 10 being normal): 2 IMPRESSION: 1. Large Right MCA infarct. ASPECTS 2. No hemorrhagic transformation, and only minor intracranial mass effect at this time with no midline shift. 2. Underlying bilateral white matter disease and chronic appearing right cerebellar infarct. 3. These results were communicated to Dr. Otelia Limes at 4:26 am on 10/16/2022 by text page via the [facility] messaging system. Electronically Signed   By: Odessa Fleming M.D.   On: 10/16/2022 04:27    Assessment/Plan: Postop day 1: The patient is able.  Her head CT looks good.  LOS: 1 day     Cristi Loron 10/17/2022, 8:11 AM     Patient ID: Sheri Beck, female   DOB: 1966/03/09, 57 y.o.   MRN: [id number]

## 2022-10-17 NOTE — Progress Notes (Signed)
NAME:  Sheri Beck, MRN:  [id number], DOB:  1965/12/30, LOS: 1 ADMISSION DATE:  10/16/2022, CONSULTATION DATE:  1/13 REFERRING MD:  Dr. Randal Buba, CHIEF COMPLAINT:  R MCA   History of Present Illness:  Patient is a 57 year old female with pertinent PMH of HTN, prediabetes presents to [facility] ED on 1/13 code stroke.  Patient's LKN on 1/13 at 9.  Husband witnessed patient having left facial droop and slurred speech.  EMS called and witnessed patient with left-sided weakness and lethargy; unequal pupils.  Patient transported to [facility] as code stroke.  Upon arrival to [facility], patient's vital stable.  Neuro consulted.  Patient with left-sided deficits.  CT head showing large right MCA infarct. Not tnk candidate due to risk of hemorrhagic transformation.  High risk of developing significant edema. NSG consulted and planning to start patient on hypertonic saline. Labs pending. PCCM consulted for icu admission.  NSY took to OR for hemicrani. On arrival to ICU patient with loud snoring, eyelid apraxia, when awakens follows commands on right hemibody. Left with no motor movement. On 50 ml/hr 3%.   Pertinent  Medical History  HTN, pre-diabetes   Significant Hospital Events: Including procedures, antibiotic start and stop dates in addition to other pertinent events   1/13 large R mca stroke w/ cerebral edema; neuro and nsg following; hypertonic saline for edema; pccm to admit  Interim History / Subjective:  No events overnight. Off 3% this AM.   Objective   Blood pressure (!) 134/105, pulse 73, temperature 98.3 F (36.8 C), temperature source Oral, resp. rate 17, height 5\' 2"  (1.575 m), weight 98.4 kg, SpO2 100 %.        Intake/Output Summary (Last 24 hours) at 10/17/2022 1031 Last data filed at 10/17/2022 0800 Gross per 24 hour  Intake 1741.89 ml  Output 2055 ml  Net -313.11 ml   Filed Weights   10/16/22 0840  Weight: 98.4 kg    Examination: General: Critically ill adult female, lying in bed,  no distress  HEENT: MM pink/moist, JP drain and sutures noted to right cranium,   Neuro: opens eyes with stimulation, follows commands briskly on right hemibody, triple flexes left lower, no movement left upper  CV: RRR, HR 61, no mRG  PULM:  clear breath sounds, no use of accessory muscles  GI: soft, active bowel sounds  Extremities: warm/dry, no edema  Skin: no rashes or lesions appreciated  Resolved Hospital Problem list     Assessment & Plan:   R MCA stroke -high risk of developing significant edema w/ mass effect and possible herniation -S/P Crani 1/13  -CTA 1/14 Large Right MCA infarct with progressive cytotoxic edema, but no leftward midline shift following decompressive craniectomy. Basilar cisterns remain patent. Mild petechial hemorrhage along the periphery of the infarct. But no malignant hemorrhagic transformation. -UDS +Cocaine  Plan: -neuro and NSY following; appreciate recs -frequent neuro checks -BP goal per neuro -statin, DAPT per neuro -Continue neuroprotective measures- normothermia, euglycemia, HOB greater than 30, head in neutral alignment, normocapnia, normoxia.  -Lipid/Hemoglobin AIC pending  -ECHO with bubble pending  -PT/OT/SLP >> Speech pending, may need Cortrack   Hypernatremia in setting of hypertonic  Plan: -Trend BMP -3% stopped this AM  -Allow to self resolve. No dextrose or free water for now   HTN Plan: -Systolic BP goal <295  -hold home asa, norvasc, and liniopril/hctz while npo   Prediabetes -on 1/2 A1C 6.3 Plan: -cbg monitoring -HAIC pending   Leukocytosis, likely reactive, afebrile   -  U/A negative  Plan: -Trend WBC and Fever Curve  Best Practice (right click and "Reselect all SmartList Selections" daily)   Diet/type: NPO DVT prophylaxis: SCD GI prophylaxis: N/A Lines: N/A Foley:  D/C  Code Status:  full code Last date of multidisciplinary goals of care discussion [1/13 spoke with husband and patient at bedside]  Labs    CBC: Recent Labs  Lab 10/16/22 0412 10/16/22 0413 10/16/22 0501 10/17/22 0550  WBC 11.3*  --   --  17.2*  NEUTROABS 9.1*  --   --   --   HGB 17.1* 17.3* 18.0* 12.5  HCT 51.3* 51.0* 53.0* 36.4  MCV 89.1  --   --  87.1  PLT 245  --   --  440    Basic Metabolic Panel: Recent Labs  Lab 10/16/22 0413 10/16/22 0501 10/16/22 0553 10/16/22 1407 10/16/22 2033 10/17/22 0204 10/17/22 0550  NA 138 141 139 139 145 148* 150*  K 7.0* 3.8 4.7  --   --   --  3.9  CL 109 113* 105  --   --   --  120*  CO2  --   --  23  --   --   --  24  GLUCOSE 161* 138* 118*  --   --   --  135*  BUN 42* 29* 27*  --   --   --  16  CREATININE 0.90 0.90 0.97  --   --   --  0.80  CALCIUM  --   --  8.9  --   --   --  8.0*  MG  --   --  2.3  --   --   --  2.2  PHOS  --   --   --   --   --   --  2.8   GFR: Estimated Creatinine Clearance: 84 mL/min (by C-G formula based on SCr of 0.8 mg/dL). Recent Labs  Lab 10/16/22 0412 10/17/22 0550  WBC 11.3* 17.2*    Liver Function Tests: Recent Labs  Lab 10/16/22 0553  AST 25  ALT 23  ALKPHOS 57  BILITOT 0.7  PROT 6.7  ALBUMIN 3.8   No results for input(s): "LIPASE", "AMYLASE" in the last 168 hours. No results for input(s): "AMMONIA" in the last 168 hours.  ABG    Component Value Date/Time   TCO2 16 (L) 10/16/2022 0501     Coagulation Profile: Recent Labs  Lab 10/16/22 0646  INR 1.0    Cardiac Enzymes: No results for input(s): "CKTOTAL", "CKMB", "CKMBINDEX", "TROPONINI" in the last 168 hours.  HbA1C: Hemoglobin A1C  Date/Time Value Ref Range Status  04/23/2020 01:52 PM 5.9 (A) 4.0 - 5.6 % Final  03/31/2018 11:31 AM 5.8 (A) 4.0 - 5.6 % Final   Hgb A1c MFr Bld  Date/Time Value Ref Range Status  10/05/2022 07:15 AM 6.3 4.6 - 6.5 % Final    Comment:    Glycemic Control Guidelines for People with Diabetes:Non Diabetic:  <6%Goal of Therapy: <7%Additional Action Suggested:  >8%     CBG: Recent Labs  Lab 10/16/22 1535 10/16/22 1955  10/17/22 0034 10/17/22 0400 10/17/22 0832  GLUCAP 155* 163* 155* 130* 136*    Review of Systems:   Denies pain, Denies SOB  Time Spent: 55 minutes  Hayden Pedro, AGACNP-BC [facility]  PCCM Pgr: [phone]

## 2022-10-17 NOTE — Progress Notes (Signed)
PT Cancellation Note  Patient Details Name: Sheri Beck MRN: [id number] DOB: [birth year]   Cancelled Treatment:    Reason Eval/Treat Not Completed: Active bedrest order   Stacie Glaze, PT DPT Acute Rehabilitation Services Pager [phone]  Office [phone]    [name] 10/17/2022, 7:59 AM

## 2022-10-17 NOTE — Progress Notes (Signed)
Foley pulled at 1635, pt only had 50cc of urine output, attempted to bladder scan multiple times but the bladder scan was unsuccessful and patient stated she felt like she had to pee but was unable to so patient was I&O cathed,  the catheter went in about 3 inches with a small amount of urine return, before meeting resistance and would not advance in the urethra anymore. There was blood in the catheter portion that had been inserted into the urethra noted upon removal. CCM aware.

## 2022-10-17 NOTE — Progress Notes (Signed)
[facility] Progress Note Patient Name: Sheri Beck DOB: 12-07-1965 MRN: [id number]   Date of Service  10/17/2022  HPI/Events of Note  Patient had foley D/C'd at 1600 and has minimal urine output since. Unable to bladder scan due to body habitus, attempted by 2 RNs. BSRN attempted to I&O but immediately met resistance.  eICU Interventions  Bedside RN will try to reach urology for consult     Intervention Category Intermediate Interventions: Oliguria - evaluation and management  [name] 10/17/2022, 9:37 PM

## 2022-10-17 NOTE — Progress Notes (Signed)
OT Cancellation Note  Patient Details Name: Sheri Beck MRN: [id number] DOB: Jun 02, 1966   Cancelled Treatment:    Reason Eval/Treat Not Completed: Active bedrest order (OT to evaluate when activity orders progress)  Elliot Cousin 10/17/2022, 7:06 AM

## 2022-10-17 NOTE — Progress Notes (Signed)
STROKE TEAM PROGRESS NOTE   INTERVAL HISTORY Patient is more alert and interactive today.  He tends to lie in bed with eyes closed but does respond and opens eyes partially.  He continues to have right gaze deviation and unable to look to the left past midline.  There is persistent dense left hemiplegia and partial left hemineglect.  Blood pressure is adequately controlled.  CT head from this morning shows large right MCA infarct with progressive cytotoxic edema but no leftward midline shift following decompressive craniectomy.  Mild petechial hemorrhage along the periphery of the infarct.  CT angiogram shows occlusion of the right MCA at its origin. Hypertonic saline d/c'd by CCM and serum sodium was at goal at 150   Blood pressure adequately controlled.  Vital signs stable.  Vitals:   10/17/22 0500 10/17/22 0600 10/17/22 0700 10/17/22 0800  BP: (!) 146/103 (!) 134/105    Pulse: 66 64 73   Resp: 13 15 17    Temp:    98.3 F (36.8 C)  TempSrc:    Oral  SpO2: 100% 100% 100%   Weight:      Height:       CBC:  Recent Labs  Lab 10/16/22 0412 10/16/22 0413 10/16/22 0501 10/17/22 0550  WBC 11.3*  --   --  17.2*  NEUTROABS 9.1*  --   --   --   HGB 17.1*   < > 18.0* 12.5  HCT 51.3*   < > 53.0* 36.4  MCV 89.1  --   --  87.1  PLT 245  --   --  200   < > = values in this interval not displayed.    Basic Metabolic Panel:  Recent Labs  Lab 10/16/22 0553 10/16/22 1407 10/17/22 0204 10/17/22 0550  NA 139   < > 148* 150*  K 4.7  --   --  3.9  CL 105  --   --  120*  CO2 23  --   --  24  GLUCOSE 118*  --   --  135*  BUN 27*  --   --  16  CREATININE 0.97  --   --  0.80  CALCIUM 8.9  --   --  8.0*  MG 2.3  --   --  2.2  PHOS  --   --   --  2.8   < > = values in this interval not displayed.    Lipid Panel: No results for input(s): "CHOL", "TRIG", "HDL", "CHOLHDL", "VLDL", "LDLCALC" in the last 168 hours. HgbA1c: No results for input(s): "HGBA1C" in the last 168 hours. Urine Drug  Screen:  Recent Labs  Lab 10/16/22 1414  LABOPIA NONE DETECTED  COCAINSCRNUR POSITIVE*  LABBENZ NONE DETECTED  AMPHETMU NONE DETECTED  THCU NONE DETECTED  LABBARB NONE DETECTED    Alcohol Level  Recent Labs  Lab 10/16/22 0412  ETH <10     IMAGING past 24 hours CT ANGIO HEAD NECK W WO CM  Addendum Date: 10/17/2022   ADDENDUM REPORT: 10/17/2022 06:16 ADDENDUM: Study discussed by telephone with Dr. 10/19/2022 at 6:16 am on 10/17/2022. Electronically Signed   By: 10/19/2022 M.D.   On: 10/17/2022 06:16   Result Date: 10/17/2022 CLINICAL DATA:  57 year old female code stroke presentation yesterday morning. Large right MCA infarct. Postoperative day 1 decompressive craniotomy for right MCA brain edema. EXAM: CT ANGIOGRAPHY HEAD AND NECK TECHNIQUE: Multidetector CT imaging of the head and neck was performed using the standard protocol during  bolus administration of intravenous contrast. Multiplanar CT image reconstructions and MIPs were obtained to evaluate the vascular anatomy. Carotid stenosis measurements (when applicable) are obtained utilizing NASCET criteria, using the distal internal carotid diameter as the denominator. RADIATION DOSE REDUCTION: This exam was performed according to the departmental dose-optimization program which includes automated exposure control, adjustment of the mA and/or kV according to patient size and/or use of iterative reconstruction technique. CONTRAST:  10mL OMNIPAQUE IOHEXOL 350 MG/ML SOLN COMPARISON:  Presentation head CT 10/16/2022. 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 Interval decompressive craniectomy with mild brain herniation through the skull defect (series 15, image 19). Increased mass effect on the right lateral ventricle, especially the frontal horn, but there is no leftward midline shift. Basilar cisterns remain patent. Hyperdense right MCA and its branches. There are scattered small foci of  petechial hemorrhage along the periphery of the infarcted right hemisphere (series 15, images 21, 22, 23). No malignant hemorrhagic transformation. Elsewhere gray-white differentiation is stable. Small chronic right cerebellar infarct. Calvarium and skull base: New long segment right craniectomy. Paranasal sinuses: Visualized paranasal sinuses and mastoids are stable and well aerated. Orbits: Postoperative changes to the right scalp. Postoperative scalp drain in place. Overlying skin staples. Orbits soft tissues are stable, negative. CTA NECK Skeleton: Right craniectomy described above. No other No acute osseous abnormality identified. Upper chest: Negative, lung apices are fairly clear. Other neck: Pharynx motion artifact. No other acute finding in the neck. Aortic arch: 3 vessel arch configuration with minimal distal arch atherosclerosis. Right carotid system: Mildly tortuous brachiocephalic artery and right CCA origin. Motion artifact at the right carotid bifurcation. But no significant plaque or stenosis identified. Left carotid system: Similar mild tortuosity and left carotid bifurcation motion artifact but no plaque or stenosis is identified. Vertebral arteries: Proximal subclavian arteries and vertebral artery origins are normal. Codominant vertebral arteries with tortuous V1 segments, but no vertebral plaque or stenosis to the skull base. CTA HEAD Posterior circulation: Distal vertebral arteries and vertebrobasilar junction are normal. Normal left PICA origin and dominant appearing right AICA origin. Patent basilar artery without stenosis. Patent SCA and PCA origins. Posterior communicating arteries are diminutive or absent. Bilateral PCA branches are within normal limits. Anterior circulation: Both ICA siphons are patent. The left siphon appears normal to the carotid terminus. The right ICA siphon caliber is mildly diminished throughout. But there is no significant siphon plaque or stenosis. Right ICA  terminus and right ACA origin remain patent but the MCA origin is occluded. There is minimal right MCA collateral enhancement. Contralateral left ICA terminus, left MCA and ACA origin are normal. A1 segments, anterior communicating artery, and bilateral ACA branches are within normal limits. Left MCA M1 segment and bifurcation are patent without stenosis. Left MCA branches are within normal limits. Venous sinuses: Early contrast timing. The superior sagittal sinus, transverse and sigmoid sinuses appear grossly patent. Anatomic variants: None. Review of the MIP images confirms the above findings IMPRESSION: 1. Large Right MCA infarct with progressive cytotoxic edema, but no leftward midline shift following decompressive craniectomy. Basilar cisterns remain patent. 2. Mild petechial hemorrhage along the periphery of the infarct. But no malignant hemorrhagic transformation. 3. Confluent occlusion of the Right MCA at its origin. Right ICA terminus and Right ACA remain patent. And virtually NO atherosclerosis in the head or neck. Given other evidence of chronic cerebral ischemia (right cerebellum), consider the possibility of a PFO or similar thromboembolic mechanism. Salient findings were communicated to  Dr. Cheral Marker at 5:47 am on 10/17/2022 by text page via the [facility] messaging system. Electronically Signed: By: Genevie Ann M.D. On: 10/17/2022 05:50    PHYSICAL EXAM  Constitutional: Appears well-developed and well-nourished.  [name] Cardiovascular: Normal rate and regular rhythm.  Respiratory: snoring, rate/rhythm regular Skull shows right hemicraniectomy surgical defect with bandage surgical clips Neuro: Mental Status: Patient is lying with eyes closed, awakens to voice, and opens eyes partially, nods appropriately. Follows commands on the right , no verbal output on my exam. Nursing states that she did say her name when asked.  Right gaze preference and unable to look to the left past  midline.  Partial left hemineglect Cranial Nerves: Eyes to the right with forced eye opening. Unable able to bring them midline.Left facial droop. Hearing is intact to voice Head turned to the right Motor: Moves right upper and lower extremity antigravity.  No movement noted on the left.  Sensory: Localizes to painful stimuli on the right. No movement with painful stimuli on the left. States she can not feel light or deep pain on the left but does acknowledge the left side    ASSESSMENT/PLAN Sheri Beck is a 57 y.o. female with history of hypertension presenting with left hemiplegia, depressed level of consciousness and rightward gaze.   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  Patient appears to be a rapid progresser of malignant cerebral edema with significant established edema within few hours of onset Etiology:  large vessel occlusion right ICA or right middle cerebral artery likely. Code Stroke CT head - Large Right MCA infarct. ASPECTS 2. No hemorrhagic transformation, and only minor intracranial mass effect at this time with no midline shift. CTA head & neck right M1 occlusion at its origin. CT Repeat 1/14 shows large right MCA infarct with cytotoxic edema and s/p right hemicraniectomy but no midline shift to the left.  Trace petechial hemorrhage at the periphery of the infarct. MRI  pending 2D Echo pending LDL 151 HgbA1c 6.3 VTE prophylaxis - scds    Diet   Diet NPO time specified   No antithrombotic prior to admission, now on No antithrombotic.  Therapy recommendations:  Pending Disposition:  Pending   Brain Compression Hypertonic saline 50cc/hr S/p right frontal temporal parietal decompressive craniectomy with insertion of craniotomy flap into abdomen, JP drain in place  Hypertension Home meds:  Amlodipine 10mg , lisinopril-hydrochlorothiazide  Stable BP goal less  than 160 PRN hydralazine and labetalol  Long-term BP goal normotensive  Hyperlipidemia Home meds:  None, resumed in hospital LDL 151, goal < 70 Add statin when no longer NPO  Other Stroke Risk Factors Obesity, Body mass index is 39.69 kg/m., BMI >/= 30 associated with increased stroke risk, recommend weight loss, diet and exercise as appropriate   Other Active Problems   Hospital day # 1  Patient presented with sudden onset of right gaze deviation and left hemiplegia and hemisensory loss with NIH stroke scale of 20  due to large right MCA infarct presented within time window for thrombolysis but CT scan showed established large right MCA infarct with aspect score of 2 making it contraindication with high risk for hemorrhagic transformation with TNK.  Patient was taken for emergency hemicraniectomy after discussion with neurosurgery.  Her neurological exam has shown fair improvement but dense left hemiplegia and neglect persists.  Recommend close neurological follow-up and strict control of hypertension  with systolic goal below 350 and plan to discontinue hypertonic saline .  Marland Kitchen  Long discussion with the patient's sister at the bedside and answered questions..  Continue ongoing stroke workup with echocardiogram and cardiac monitoring.  Discussed with Dr. Lynetta Mare critical care medicine.This patient is critically ill and at significant risk of neurological worsening, death and care requires constant monitoring of vital signs, hemodynamics,respiratory and cardiac monitoring, extensive review of multiple databases, frequent neurological assessment, discussion with family, other specialists and medical decision making of high complexity.I have made any additions or clarifications directly to the above note.This critical care time does not reflect procedure time, or teaching time or supervisory time of PA/NP/Med Resident etc but could involve care discussion time.  Discussed with Dr. Lynetta Mare critical  care medicine and Dr. Arnoldo Morale neurosurgeon  I spent 35 minutes of neurocritical care time  in the care of  this patient.      Antony Contras, MD Medical Director [facility] Pager: [phone] 10/17/2022 8:56 AM  To contact Stroke Continuity provider, please refer to [email or web address] After hours, contact General Neurology

## 2022-10-17 NOTE — Evaluation (Addendum)
Speech Language Pathology Evaluation Patient Details Name: Sheri Beck MRN: [id number] DOB: 05-08-1966 Today's Date: 10/17/2022 Time: 3557-3220 SLP Time Calculation (min) (ACUTE ONLY): 18 min  Problem List:  Patient Active Problem List   Diagnosis Date Noted   Stroke ([place]) 10/16/2022   Stroke (cerebrum) ([place]) 10/16/2022   Hyperlipidemia 09/28/2022   Severe obesity (BMI 35.0-39.9) with comorbidity ([place]) 09/28/2022   Acute bacterial conjunctivitis of right eye 05/06/2020   Dental abscess 05/06/2020   Essential hypertension 05/06/2020   Past Medical History:  Past Medical History:  Diagnosis Date   Hypertension    Past Surgical History:  Past Surgical History:  Procedure Laterality Date   CRANIOTOMY Right 10/16/2022   Procedure: RIGHT CRANIECTOMY;  Surgeon: Newman Pies, MD;  Location: [place];  Service: Neurosurgery;  Laterality: Right;   HPI:  Pt is a 57 year old female who presented to the ED on 1/13 due to left-sided weakness and slurred speech. 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 Risk for brain herniation and neurological death due to malignant cerebral edema thought to be high; pt s/p emergency right frontal temporal parietal decompressive craniectomy1/13. PMH: HTN, prediabetes   Assessment / Plan / Recommendation Clinical Impression  Pt participated in speech-language-cognition evaluation with her fiance present. Pt reported that she has a ninth-grade education, is currently unemployed, and resides with her fiance who manages her medications. Pt and her fiance denied the pt having any baseline deficits in speech, language, or cognition, but stated that her speech and thinking are now "slow". [facility] Mental Status Examination was completed to evaluate the pt's cognitive-linguistic skills. She achieved a score of 13/30 which is below the normal limits of 25 or more out of 30  for an individual with her level of education. She exhibited difficulty in the  areas of awareness, attention, declarative memory, complex problem solving, and executive function. Pt's processing speed was reduced and repetition was often necessary; however, SLP suspects that pt's performance was also impacted by her lethargy. Language skills were grossly WFL, but auditory comprehension was intermittently negatively impacted by impairments in sustained/selective attention. Pt also presented with moderate dysarthria characterized by reduced articulatory precision, a hoarse vocal quality, and reduced vocal intensity which negatively impacted speech intelligibility at the sentence level. Skilled SLP services are clinically indicated at this time to improve motor speech and cognitive-linguistic function.    SLP Assessment  SLP Recommendation/Assessment: Patient needs continued Speech Lanaguage Pathology Services SLP Visit Diagnosis: Cognitive communication deficit (R41.841);Dysarthria and anarthria (R47.1)    Recommendations for follow up therapy are one component of a multi-disciplinary discharge planning process, led by the attending physician.  Recommendations may be updated based on patient status, additional functional criteria and insurance authorization.    Follow Up Recommendations   (Continued SLP services at level of care recommended by PT/OT)    Assistance Recommended at Discharge  Frequent or constant Supervision/Assistance  Functional Status Assessment Patient has had a recent decline in their functional status and demonstrates the ability to make significant improvements in function in a reasonable and predictable amount of time.  Frequency and Duration min 2x/week  2 weeks      SLP Evaluation Cognition  Overall Cognitive Status: Impaired/Different from baseline Arousal/Alertness: Awake/alert Orientation Level: Oriented X4 Year: 2024 Month: January Day of Week: Correct Attention: Focused;Sustained Focused Attention: Appears intact Sustained Attention:  Impaired Sustained Attention Impairment: Verbal complex Memory: Impaired Memory Impairment: Retrieval deficit (Immediate: 5/5; delayed: 5/5 with additional processing time; paragraph: 4/8) Awareness: Impaired  Awareness Impairment: Emergent impairment Problem Solving: Impaired Problem Solving Impairment: Verbal complex Executive Function: Organizing;Sequencing Sequencing: Impaired (clock: 0/4) Sequencing Impairment: Verbal complex Organizing: Impaired Organizing Impairment: Verbal complex (backward digit span: 0/2) Behaviors: Impulsive       Comprehension  Auditory Comprehension Overall Auditory Comprehension: Appears within functional limits for tasks assessed Yes/No Questions: Within Functional Limits Commands: Within Functional Limits Interfering Components: Processing speed;Working Marine scientist;Attention    Expression Expression Primary Mode of Expression: Verbal Verbal Expression Initiation: No impairment Level of Generative/Spontaneous Verbalization: Conversation Repetition: No impairment Naming: No impairment Interfering Components: Attention Written Expression Dominant Hand: Right   Oral / Motor  Oral Motor/Sensory Function Overall Oral Motor/Sensory Function: Moderate impairment Facial ROM: Reduced left;Suspected CN VII (facial) dysfunction Facial Symmetry: Abnormal symmetry left;Suspected CN VII (facial) dysfunction Facial Strength: Reduced left;Suspected CN VII (facial) dysfunction Facial Sensation: Within Functional Limits Lingual ROM: Reduced right;Reduced left;Suspected CN XII (hypoglossal) dysfunction Lingual Strength: Reduced;Suspected CN XII (hypoglossal) dysfunction Motor Speech Overall Motor Speech: Impaired Respiration: Within functional limits Phonation: Low vocal intensity;Hoarse Resonance: Within functional limits Articulation: Impaired Level of Impairment: Sentence Intelligibility: Intelligibility reduced Word: 75-100% accurate Phrase: 50-74%  accurate Sentence: 25-49% accurate Conversation: 25-49% accurate Motor Planning: Witnin functional limits Motor Speech Errors: Aware;Consistent Effective Techniques: Slow rate;Over-articulate;Increased vocal intensity           Sylvester Salonga I. Hardin Negus, [place], [place] Office number [phone]  Horton Marshall 10/17/2022, 12:39 PM

## 2022-10-17 NOTE — Evaluation (Signed)
Physical Therapy Evaluation Patient Details Name: Sheri Beck MRN: [id number] DOB: 11-24-1965 Today's Date: 10/17/2022  History of Present Illness  57 yo female presents to [facility] on 1/13 with L facial droop, slurred speech, L weakness, UDS + cocaine. CTH shows large R MCA CVA. S/p R frontal temporal parietal decompressive craniectomy, insertion of craniotomy flap in abdomen on 1/13. PMHx: HTN, HLD, substance use  Clinical Impression   Pt presents with dense L hemiplegia, decreased L sensation, L inattention with eyes not crossing midline towards L, total assist for repositioning. Pt to benefit from acute PT to address deficits. Pt requiring total +2 for boost up in bed, repositioning, and chair position in bed. Unsafe to attempt EOB this date given increasing pt lethargy. PTA, pt was completely independent with mobility and ADLs. PT to progress mobility as tolerated, and will continue to follow acutely.   pt with decreased arousal as session continued, RN aware        Recommendations for follow up therapy are one component of a multi-disciplinary discharge planning process, led by the attending physician.  Recommendations may be updated based on patient status, additional functional criteria and insurance authorization.  Follow Up Recommendations Acute inpatient rehab (3hours/day)      Assistance Recommended at Discharge Frequent or constant Supervision/Assistance  Patient can return home with the following  Two people to help with walking and/or transfers;Two people to help with bathing/dressing/bathroom;Direct supervision/assist for medications management;Assistance with cooking/housework;Direct supervision/assist for financial management;Assist for transportation;Help with stairs or ramp for entrance    Equipment Recommendations Hospital bed;Wheelchair (measurements PT);Wheelchair cushion (measurements PT);Other (comment) (hoyer lift)  Recommendations for Other Services       Functional  Status Assessment Patient has had a recent decline in their functional status and/or demonstrates limited ability to make significant improvements in function in a reasonable and predictable amount of time     Precautions / Restrictions Precautions Precautions: Fall Precaution Comments: no R skull flap, abdominal flap, head JP Restrictions Weight Bearing Restrictions: No      Mobility  Bed Mobility Overal bed mobility: Needs Assistance             General bed mobility comments: dependent for all bed adjustments - session completed while supported upright in chair position    Transfers Overall transfer level: Needs assistance                 General transfer comment: deferred    Ambulation/Gait                  Stairs            Wheelchair Mobility    Modified Rankin (Stroke Patients Only) Modified Rankin (Stroke Patients Only) Pre-Morbid Rankin Score: No symptoms Modified Rankin: Severe disability     Balance Overall balance assessment: Needs assistance     Sitting balance - Comments: bed position only; dependent                                     Pertinent Vitals/Pain Pain Assessment Pain Assessment: Faces Faces Pain Scale: Hurts even more Pain Location: head Pain Descriptors / Indicators: Discomfort Pain Intervention(s): Limited activity within patient's tolerance, Monitored during session, Repositioned    Home Living Family/patient expects to be discharged to:: Private residence Living Arrangements: Spouse/significant other Available Help at Discharge: Family;Available 24 hours/day Type of Home: Apartment Home Access: Level entry  Alternate Level Stairs-Number of Steps: flight Home Layout: Two level;Bed/bath upstairs Home Equipment: None      Prior Function Prior Level of Function : Independent/Modified Independent             Mobility Comments: no AD ADLs Comments: drives without license, enjoys  going to grocery store     Hand Dominance   Dominant Hand: Right    Extremity/Trunk Assessment   Upper Extremity Assessment Upper Extremity Assessment: Defer to OT evaluation RUE Deficits / Details: limited assessment but seemingly WFL. RUE Sensation: WNL RUE Coordination: WNL LUE Deficits / Details: flaccid, increased tone. no response to noxious stim LUE Sensation: decreased light touch;decreased proprioception LUE Coordination: decreased fine motor;decreased gross motor    Lower Extremity Assessment Lower Extremity Assessment: LLE deficits/detail LLE Deficits / Details: 0/5 throughout, hypertonia noted in hip and knee flexion. LLE Sensation: decreased light touch    Cervical / Trunk Assessment Cervical / Trunk Assessment: Other exceptions Cervical / Trunk Exceptions: head rotated to R and cervical flexion, movable out of this posture with manual assist  Communication   Communication: No difficulties  Cognition Arousal/Alertness: Lethargic Behavior During Therapy: Flat affect Overall Cognitive Status: Difficult to assess                                 General Comments: Oriented and following R sided cues with max cues. limited by lethargy and low LOA progressing throughout session. Will benefit from higher level cog assessment when able        General Comments General comments (skin integrity, edema, etc.): pt with decreased arousal as session continued, RN aware and called stat head CT    Exercises     Assessment/Plan    PT Assessment Patient needs continued PT services  PT Problem List Decreased strength;Decreased mobility;Decreased safety awareness;Decreased activity tolerance;Decreased balance;Decreased knowledge of use of DME;Pain;Decreased cognition;Decreased range of motion;Decreased knowledge of precautions;Impaired tone;Impaired sensation       PT Treatment Interventions DME instruction;Therapeutic activities;Gait training;Therapeutic  exercise;Patient/family education;Balance training;Functional mobility training;Neuromuscular re-education    PT Goals (Current goals can be found in the Care Plan section)  Acute Rehab PT Goals PT Goal Formulation: With patient Time For Goal Achievement: 10/31/22 Potential to Achieve Goals: Fair    Frequency Min 3X/week     Co-evaluation PT/OT/SLP Co-Evaluation/Treatment: Yes Reason for Co-Treatment: For patient/therapist safety;To address functional/ADL transfers   OT goals addressed during session: ADL's and self-care       AM-PAC PT "6 Clicks" Mobility  Outcome Measure Help needed turning from your back to your side while in a flat bed without using bedrails?: Total Help needed moving from lying on your back to sitting on the side of a flat bed without using bedrails?: Total Help needed moving to and from a bed to a chair (including a wheelchair)?: Total Help needed standing up from a chair using your arms (e.g., wheelchair or bedside chair)?: Total Help needed to walk in hospital room?: Total Help needed climbing 3-5 steps with a railing? : Total 6 Click Score: 6    End of Session   Activity Tolerance: Patient limited by fatigue;Patient limited by lethargy Patient left: in bed;with call bell/phone within reach;with bed alarm set;with family/visitor present;with nursing/sitter in room Nurse Communication: Mobility status PT Visit Diagnosis: Other abnormalities of gait and mobility (R26.89);Muscle weakness (generalized) (M62.81)    Time: 2979-8921 PT Time Calculation (min) (ACUTE ONLY): 25 min  Charges:   PT Evaluation $PT Eval Low Complexity: 1 Low         Navarro Nine S, PT DPT Acute Rehabilitation Services Pager [phone]  Office [phone]   [name] 10/17/2022, 3:16 PM

## 2022-10-17 NOTE — Evaluation (Signed)
Clinical/Bedside Swallow Evaluation Patient Details  Name: Sheri Beck MRN: [id number] Date of Birth: Sep 30, 1966  Today's Date: 10/17/2022 Time: SLP Start Time (ACUTE ONLY): 1126 SLP Stop Time (ACUTE ONLY): 1142 SLP Time Calculation (min) (ACUTE ONLY): 16 min  Past Medical History:  Past Medical History:  Diagnosis Date   Hypertension    Past Surgical History:  Past Surgical History:  Procedure Laterality Date   CRANIOTOMY Right 10/16/2022   Procedure: RIGHT CRANIECTOMY;  Surgeon: Newman Pies, MD;  Location: [facility];  Service: Neurosurgery;  Laterality: Right;   HPI:  Pt is a 57 year old female who presented to the ED on 1/13 due to left-sided weakness and slurred speech. 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 Risk for brain herniation and neurological death due to malignant cerebral edema thought to be high; pt s/p emergency right hemicraniectomy 1/13. PMH: HTN, prediabetes    Assessment / Plan / Recommendation  Clinical Impression  Pt was seen for bedside swallow evaluation with her fiance, sister and niece present. All parties denied the pt having a history of dysphagia. Pt's alertness was adequate for p.o. intake, but suboptimal. Oral mechanism exam was significant for left-sided facial weakness and reduced lingual strength. Dentition was reduced, but adequate for mastication. She presented with symptoms of oropharyngeal dysphagia characterized by reduced labial seal and stripping, significantly prolonged mastication, mild oral holding, and signs of aspiration with thin liquids and regular texture solids. The impact of her lethargy on her performance is considered. A dysphagia 1 diet with nectar thick liquids is recommended at this time. SLP will follow to ensure tolerance, and for diet advancement or instrumental assessment as clinically indicated. SLP Visit Diagnosis: Dysphagia, unspecified (R13.10)    Aspiration Risk  Mild aspiration risk    Diet Recommendation Dysphagia 1  (Puree);Nectar-thick liquid   Liquid Administration via: Cup;Straw Medication Administration: Crushed with puree (or whole with puree; as tolerated) Supervision: Staff to assist with self feeding Compensations: Slow rate;Small sips/bites Postural Changes: Seated upright at 90 degrees    Other  Recommendations Oral Care Recommendations: Oral care BID Other Recommendations: Order thickener from pharmacy    Recommendations for follow up therapy are one component of a multi-disciplinary discharge planning process, led by the attending physician.  Recommendations may be updated based on patient status, additional functional criteria and insurance authorization.  Follow up Recommendations  (Continued SLP services at level of care recommended by PT/OT)      Assistance Recommended at Discharge    Functional Status Assessment Patient has had a recent decline in their functional status and demonstrates the ability to make significant improvements in function in a reasonable and predictable amount of time.  Frequency and Duration min 2x/week  2 weeks       Prognosis Prognosis for Safe Diet Advancement: Good Barriers to Reach Goals: Cognitive deficits;Severity of deficits      Swallow Study   General Date of Onset: 10/16/22 HPI: Pt is a 57 year old female who presented to the ED on 1/13 due to left-sided weakness and slurred speech. 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 Risk for brain herniation and neurological death due to malignant cerebral edema thought to be high; pt s/p emergency right hemicraniectomy 1/13. PMH: HTN, prediabetes Type of Study: Bedside Swallow Evaluation Previous Swallow Assessment: none Diet Prior to this Study: NPO Temperature Spikes Noted: No Respiratory Status: Nasal cannula History of Recent Intubation: Yes Length of Intubations (days):  (for surgery) Date extubated: 10/16/22 Behavior/Cognition: Alert;Cooperative;Pleasant mood Oral Cavity Assessment: Within  Functional Limits Oral Care Completed by SLP: No Oral Cavity - Dentition: Adequate natural dentition Vision: Functional for self-feeding Patient Positioning: Upright in bed;Postural control adequate for testing Baseline Vocal Quality: Low vocal intensity Volitional Cough: Weak;Congested Volitional Swallow: Able to elicit    Oral/Motor/Sensory Function Overall Oral Motor/Sensory Function: Moderate impairment Facial ROM: Reduced left;Suspected CN VII (facial) dysfunction Facial Symmetry: Abnormal symmetry left;Suspected CN VII (facial) dysfunction Facial Strength: Reduced left;Suspected CN VII (facial) dysfunction Facial Sensation: Within Functional Limits Lingual ROM: Reduced right;Reduced left;Suspected CN XII (hypoglossal) dysfunction Lingual Strength: Reduced;Suspected CN XII (hypoglossal) dysfunction   Ice Chips Ice chips: Within functional limits Presentation: Spoon   Thin Liquid Thin Liquid: Impaired Presentation: Cup Pharyngeal  Phase Impairments: Cough - Immediate;Throat Clearing - Immediate;Suspected delayed Swallow    Nectar Thick Nectar Thick Liquid: Impaired Presentation: Straw Oral Phase Impairments: Poor awareness of bolus Oral phase functional implications: Oral holding   Honey Thick Honey Thick Liquid: Not tested   Puree Puree: Impaired Presentation: Spoon Oral Phase Impairments: Reduced labial seal   Solid     Solid: Impaired Presentation: Self Fed Oral Phase Impairments: Impaired mastication Pharyngeal Phase Impairments: Throat Clearing - Immediate;Throat Clearing - Delayed     Sheri Beck, [place], [place] Office number [phone]  Horton Marshall 10/17/2022,12:15 PM

## 2022-10-18 ENCOUNTER — Inpatient Hospital Stay (HOSPITAL_COMMUNITY): Payer: Medicaid Other

## 2022-10-18 DIAGNOSIS — I63411 Cerebral infarction due to embolism of right middle cerebral artery: Secondary | ICD-10-CM | POA: Diagnosis not present

## 2022-10-18 DIAGNOSIS — I6389 Other cerebral infarction: Secondary | ICD-10-CM

## 2022-10-18 DIAGNOSIS — I639 Cerebral infarction, unspecified: Secondary | ICD-10-CM | POA: Diagnosis not present

## 2022-10-18 LAB — ECHOCARDIOGRAM COMPLETE BUBBLE STUDY
Area-P 1/2: 5.27 cm2
Calc EF: 60.1 %
S' Lateral: 2.8 cm
Single Plane A2C EF: 63.7 %
Single Plane A4C EF: 60.1 %

## 2022-10-18 LAB — BASIC METABOLIC PANEL
Anion gap: 6 (ref 5–15)
BUN: 10 mg/dL (ref 6–20)
CO2: 25 mmol/L (ref 22–32)
Calcium: 8.3 mg/dL — ABNORMAL LOW (ref 8.9–10.3)
Chloride: 116 mmol/L — ABNORMAL HIGH (ref 98–111)
Creatinine, Ser: 0.73 mg/dL (ref 0.44–1.00)
GFR, Estimated: >60 mL/min (ref >60)
Glucose, Bld: 120 mg/dL — ABNORMAL HIGH (ref 70–99)
Potassium: 3.3 mmol/L — ABNORMAL LOW (ref 3.5–5.1)
Sodium: 147 mmol/L — ABNORMAL HIGH (ref 135–145)

## 2022-10-18 LAB — SODIUM
Sodium: 148 mmol/L — ABNORMAL HIGH (ref 135–145)
Sodium: 149 mmol/L — ABNORMAL HIGH (ref 135–145)
Sodium: 148 mmol/L — ABNORMAL HIGH (ref 135–145)
Sodium: 141 mmol/L (ref 135–145)

## 2022-10-18 LAB — CBC
HCT: 35.4 % — ABNORMAL LOW (ref 36.0–46.0)
Hemoglobin: 11.7 g/dL — ABNORMAL LOW (ref 12.0–15.0)
MCH: 30 pg (ref 26.0–34.0)
MCHC: 33.1 g/dL (ref 30.0–36.0)
MCV: 90.8 fL (ref 80.0–100.0)
Platelets: 184 10*3/uL (ref 150–400)
RBC: 3.9 MIL/uL (ref 3.87–5.11)
RDW: 14.5 % (ref 11.5–15.5)
WBC: 13.3 10*3/uL — ABNORMAL HIGH (ref 4.0–10.5)
nRBC: 0 % (ref 0.0–0.2)

## 2022-10-18 LAB — LIPID PANEL
Cholesterol: 159 mg/dL (ref 0–200)
HDL: 43 mg/dL (ref >40)
LDL Cholesterol: 93 mg/dL (ref 0–99)
Total CHOL/HDL Ratio: 3.7 RATIO
Triglycerides: 116 mg/dL (ref <150)
VLDL: 23 mg/dL (ref 0–40)

## 2022-10-18 LAB — GLUCOSE, CAPILLARY
Glucose-Capillary: 125 mg/dL — ABNORMAL HIGH (ref 70–99)
Glucose-Capillary: 147 mg/dL — ABNORMAL HIGH (ref 70–99)
Glucose-Capillary: 106 mg/dL — ABNORMAL HIGH (ref 70–99)
Glucose-Capillary: 123 mg/dL — ABNORMAL HIGH (ref 70–99)
Glucose-Capillary: 88 mg/dL (ref 70–99)

## 2022-10-18 LAB — HEMOGLOBIN A1C
Hgb A1c MFr Bld: 6 % — ABNORMAL HIGH (ref 4.8–5.6)
Mean Plasma Glucose: 125.5 mg/dL

## 2022-10-18 LAB — PHOSPHORUS: Phosphorus: 2.3 mg/dL — ABNORMAL LOW (ref 2.5–4.6)

## 2022-10-18 LAB — MAGNESIUM: Magnesium: 2.4 mg/dL (ref 1.7–2.4)

## 2022-10-18 MED ORDER — STROKE: EARLY STAGES OF RECOVERY BOOK
Freq: Once | Status: AC
  Filled 2022-10-18: qty 1

## 2022-10-18 MED ORDER — POTASSIUM CHLORIDE 20 MEQ PO PACK
20.0000 meq | PACK | Freq: Once | ORAL | Status: AC
  Administered 2022-10-18: 20 meq via ORAL
  Filled 2022-10-18: qty 1

## 2022-10-18 MED ORDER — POTASSIUM CHLORIDE 10 MEQ/50ML IV SOLN
10.0000 meq | INTRAVENOUS | Status: AC
  Administered 2022-10-18 (×2): 10 meq via INTRAVENOUS
  Filled 2022-10-18 (×2): qty 50

## 2022-10-18 MED ORDER — POTASSIUM PHOSPHATES 15 MMOLE/5ML IV SOLN
15.0000 mmol | Freq: Once | INTRAVENOUS | Status: AC
  Administered 2022-10-18: 15 mmol via INTRAVENOUS
  Filled 2022-10-18: qty 5

## 2022-10-18 NOTE — Progress Notes (Signed)
Pharmacy Electrolyte Replacement  Recent Labs:  Recent Labs    10/18/22 0500  K 3.3*  MG 2.4  PHOS 2.3*  CREATININE 0.73    Low Critical Values (K </= 2.5, Phos </= 1, Mg </= 1) Present: None  MD Contacted: n/a - no critical values noted  Plan: KPhos 58mmol IV x 1 KCL 89mEq IV x 2 doses + KCl 8mEq PO x 1 Recheck labs in AM per protocol   Arturo Morton, PharmD, BCPS Please check AMION for all [place] contact numbers Clinical Pharmacist 10/18/2022 1:06 PM

## 2022-10-18 NOTE — PMR Pre-admission (Shared)
PMR Admission Coordinator Pre-Admission Assessment  Patient: Sheri Beck is an 57 y.o., female MRN: [id number] DOB: [birth year] Height: 5\' 2"  (157.5 cm) Weight: 98.4 kg  Insurance Information HMO: ***    PPO: ***     PCP:      IPA:      80/20:      OTHER:  PRIMARY: [employer] Medicaid      Policy#: [id number]      Subscriber: patient CM Name: ***      Phone#: ***     Fax#: *** Pre-Cert#: ***      Employer: *** Benefits:  Phone #: ***     Name: *** Irene Shipper. Date: ***     Deduct: ***      Out of Pocket Max: ***      Life Max: *** CIR: ***      SNF: *** Outpatient: ***     Co-Pay: *** Home Health: ***      Co-Pay: *** DME: ***     Co-Pay: *** Providers: in-network SECONDARY:       Policy#:      Phone#:   Financial Counselor:       Phone#:   [employer] Engineer, [employer]" for patients in Inpatient Rehabilitation Facilities with attached "Privacy Act [place] Records" was provided and verbally reviewed with: N/A  Emergency Contact Information Contact Information     Name Relation Home Work [place] Daughter [phone]     Bessent,Paul Father [phone]     Parham,Tony Significant other   [phone]   Papesh,Chanel Niece   [phone]   Keener,Darrell Brother   [phone]       Current Medical History  Patient Admitting Diagnosis: CVA History of Present Illness: Pt is a 57 year old female with medical hx significant for: HTN. Pt [facility] to [facility] on 10/16/22 as a Code Stroke. Pt had acute onset of left hemiplegia, rightward gaze, decreased level of consciousness. 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 Pt was not a tPA or thrombectomy candidate. Neurosurgery consulted regarding decompressive craniectomy. Pt underwent right frontal temporal parietal decompressive craniectomy with insertion of craniotomy flap into abdominal subcutaneous tissue on 10/16/22; JP drain placed.Therapy evaluations completed and CIR recommended d/t pt's deficits  in functional mobility, inability to complete ADLs independently, cognitive-linguistic deficits, and dysphagia.*** Complete NIHSS TOTAL: 20  Patient's medical record from [facility] has been reviewed by the rehabilitation admission coordinator and physician.  Past Medical History  Past Medical History:  Diagnosis Date   Hypertension     Has the patient had major surgery during 100 days prior to admission? Yes  Family History   family history includes Cancer in her mother; Glaucoma in her father; Heart disease in her father; Varicose Veins in her paternal grandfather.  Current Medications  Current Facility-Administered Medications:    acetaminophen (TYLENOL) tablet 650 mg, 650 mg, Oral, Q4H PRN, 650 mg at 10/18/22 0940 **OR** acetaminophen (TYLENOL) suppository 650 mg, 650 mg, Rectal, Q4H PRN, Newman Pies, MD   Chlorhexidine Gluconate Cloth 2 % PADS 6 each, 6 each, Topical, Daily, Newman Pies, MD, 6 each at 10/18/22 1031   heparin injection 5,000 Units, 5,000 Units, Subcutaneous, Q8H, Garvin Fila, MD, 5,000 Units at 10/18/22 1345   hydrALAZINE (APRESOLINE) injection 10-40 mg, 10-40 mg, Intravenous, Q4H PRN, Omar Person, NP   labetalol (NORMODYNE) injection 10-40 mg, 10-40 mg, Intravenous, Q4H PRN, Omar Person, NP, 20 mg at 10/16/22 1920   ondansetron (  ZOFRAN) tablet 4 mg, 4 mg, Oral, Q4H PRN **OR** ondansetron (ZOFRAN) injection 4 mg, 4 mg, Intravenous, Q4H PRN, Tressie Stalker, MD   Oral care mouth rinse, 15 mL, Mouth Rinse, 4 times per day, Caryl Pina, MD, 15 mL at 10/18/22 1200   Oral care mouth rinse, 15 mL, Mouth Rinse, PRN, Caryl Pina, MD   pantoprazole (PROTONIX) injection 40 mg, 40 mg, Intravenous, QHS, Tressie Stalker, MD, 40 mg at 10/17/22 2217   potassium chloride 10 mEq in 50 mL *CENTRAL LINE* IVPB, 10 mEq, Intravenous, Q1 Hr x 2, von Dohlen, Haley B, RPH, Last Rate: 50 mL/hr at 10/18/22 1530, 10 mEq at 10/18/22 1530   potassium  PHOSPHATE 15 mmol in dextrose 5 % 250 mL infusion, 15 mmol, Intravenous, Once, von Dohlen, Haley B, RPH   promethazine (PHENERGAN) tablet 12.5-25 mg, 12.5-25 mg, Oral, Q4H PRN, Tressie Stalker, MD   sodium chloride (hypertonic) 3 % solution, , Intravenous, Continuous, Micki Riley, MD, Last Rate: 40 mL/hr at 10/18/22 1531, New Bag at 10/18/22 1531   sodium chloride flush (NS) 0.9 % injection 10-40 mL, 10-40 mL, Intracatheter, Q12H, Tressie Stalker, MD, 10 mL at 10/17/22 2221   sodium chloride flush (NS) 0.9 % injection 10-40 mL, 10-40 mL, Intracatheter, PRN, Tressie Stalker, MD  Patients Current Diet:  Diet Order             DIET - DYS 1 Room service appropriate? No; Fluid consistency: Nectar Thick  Diet effective now                   Precautions / Restrictions Precautions Precautions: Fall Precaution Comments: no R skull flap, abdominal flap, head JP Restrictions Weight Bearing Restrictions: No   Has the patient had 2 or more falls or a fall with injury in the past year? No  Prior Activity Level Community (5-7x/wk): drives, works, gets out of house daily  Prior Functional Level Self Care: Did the patient need help bathing, dressing, using the toilet or eating? Independent  Indoor Mobility: Did the patient need assistance with walking from room to room (with or without device)? Independent  Stairs: Did the patient need assistance with internal or external stairs (with or without device)? Independent  Functional Cognition: Did the patient need help planning regular tasks such as shopping or remembering to take medications? Independent  Patient Information    Patient's Response To:     Home Assistive Devices / Equipment Home Equipment: None  Prior Device Use: Indicate devices/aids used by the patient prior to current illness, exacerbation or injury? {Prior Device OXBD:532992426}  Current Functional Level Cognition  Arousal/Alertness: Awake/alert Overall  Cognitive Status: Difficult to assess Difficult to assess due to: Level of arousal Orientation Level: Oriented X4 General Comments: Oriented and following R sided cues with max cues. limited by lethargy and low LOA progressing throughout session. Will benefit from higher level cog assessment when able Attention: Focused, Sustained Focused Attention: Appears intact Sustained Attention: Impaired Sustained Attention Impairment: Verbal complex Memory: Impaired Memory Impairment: Retrieval deficit (Immediate: 5/5; delayed: 5/5 with additional processing time; paragraph: 4/8) Awareness: Impaired Awareness Impairment: Emergent impairment Problem Solving: Impaired Problem Solving Impairment: Verbal complex Executive Function: Organizing, Sequencing Sequencing: Impaired (clock: 0/4) Sequencing Impairment: Verbal complex Organizing: Impaired Organizing Impairment: Verbal complex (backward digit span: 0/2) Behaviors: Impulsive    Extremity Assessment (includes Sensation/Coordination)  Upper Extremity Assessment: Defer to OT evaluation RUE Deficits / Details: limited assessment but seemingly WFL. RUE Sensation: WNL RUE Coordination: WNL LUE Deficits /  Details: flaccid, increased tone. no response to noxious stim LUE Sensation: decreased light touch, decreased proprioception LUE Coordination: decreased fine motor, decreased gross motor  Lower Extremity Assessment: LLE deficits/detail LLE Deficits / Details: 0/5 throughout, hypertonia noted in hip and knee flexion. LLE Sensation: decreased light touch    ADLs  Overall ADL's : Needs assistance/impaired Eating/Feeding: Maximal assistance, Bed level Eating/Feeding Details (indicate cue type and reason): supported upright in bed Grooming: Maximal assistance, Bed level Upper Body Bathing: Maximal assistance, Bed level Lower Body Bathing: Total assistance Upper Body Dressing : Maximal assistance, Bed level Lower Body Dressing: Total  assistance Toilet Transfer: Total assistance Toileting- Clothing Manipulation and Hygiene: Total assistance Functional mobility during ADLs: Total assistance General ADL Comments: bed level assessment due to LOA. limited by L hemi, L neglect vs hemianopsia    Mobility  Overal bed mobility: Needs Assistance General bed mobility comments: dependent for all bed adjustments - session completed while supported upright in chair position    Transfers  Overall transfer level: Needs assistance General transfer comment: deferred    Ambulation / Gait / Stairs / Wheelchair Mobility       Posture / Balance Dynamic Sitting Balance Sitting balance - Comments: bed position only; dependent Balance Overall balance assessment: Needs assistance Sitting balance - Comments: bed position only; dependent    Special needs/care consideration Skin Surgical incision: abdomen/right; head/right Bladder incontinence, External urinary catheter and Special service needs    Previous Home Environment (from acute therapy documentation) Living Arrangements: Spouse/significant other  Lives With: Significant other Available Help at Discharge: Family, Available 24 hours/day Type of Home: Apartment Home Layout: Two level Alternate Level Stairs-Number of Steps: ~20 Home Access: Stairs to enter Entrance Stairs-Rails: Left Entrance Stairs-Number of Steps: 3 Bathroom Shower/Tub: Chiropodist: Standard Bathroom Accessibility: Yes How Accessible: Accessible via walker Home Care Services: No  Discharge Living Setting    Social/Family/Support Systems    Goals    Decrease burden of Care through IP rehab admission: NA  Possible need for SNF placement upon discharge: Not anticipated  Patient Condition: I have reviewed medical records from [facility], spoken with {CHL IP CSW VW:[id number]}, and {CHL IP Patient Spouse Son Daughter Family Member:[id number]}. I {CHL IP at bedside or  phone:304550005} for inpatient rehabilitation assessment.  Patient will benefit from ongoing PT, OT, and SLP, can actively participate in 3 hours of therapy a day 5 days of the week, and can make measurable gains during the admission.  Patient will also benefit from the coordinated team approach during an Inpatient Acute Rehabilitation admission.  The patient will receive intensive therapy as well as Rehabilitation physician, nursing, social worker, and care management interventions.  Due to bladder management, safety, skin/wound care, disease management, medication administration, pain management, and patient education the patient requires 24 hour a day rehabilitation nursing.  The patient is currently *** with mobility and basic ADLs.  Discharge setting and therapy post discharge at [facility] IP discharge location:[id number]} is anticipated.  Patient has agreed to participate in the Acute Inpatient Rehabilitation Program and will admit {Time; today/tomorrow:10263}.  Preadmission Screen Completed By:  Bethel Born, 10/18/2022 4:02 PM ______________________________________________________________________   Discussed status with Dr. Marland Kitchen on *** at *** and received approval for admission today.  Admission Coordinator:  Bethel Born, CCC-SLP, time ***/Date ***   Assessment/Plan: Diagnosis: Does the need for close, 24 hr/day Medical supervision in concert with the patient's rehab needs make it unreasonable for this patient to be  served in a less intensive setting? {yes_no_potentially:3041433} Co-Morbidities requiring supervision/potential complications: *** Due to {due NW:2956213}, does the patient require 24 hr/day rehab nursing? {yes_no_potentially:3041433} Does the patient require coordinated care of a physician, rehab nurse, PT, OT, and SLP to address physical and functional deficits in the context of the above medical diagnosis(es)? {yes_no_potentially:3041433} Addressing deficits in the  following areas: {deficits:3041436} Can the patient actively participate in an intensive therapy program of at least 3 hrs of therapy 5 days a week? {yes_no_potentially:3041433} The potential for patient to make measurable gains while on inpatient rehab is {potential:3041437} Anticipated functional outcomes upon discharge from inpatient rehab: {functional outcomes:304600100} PT, {functional outcomes:304600100} OT, {functional outcomes:304600100} SLP Estimated rehab length of stay to reach the above functional goals is: *** Anticipated discharge destination: {anticipated dc setting:21604} 10. Overall Rehab/Functional Prognosis: {potential:3041437}   MD Signature: ***

## 2022-10-18 NOTE — Progress Notes (Signed)
Inpatient Rehab Admissions:  Inpatient Rehab Consult received.  Attempted to  met with patient at the bedside for rehabilitation assessment and to discuss goals and expectations of an inpatient rehab admission.  Pt was asleep and would not arouse. NSG suggested talking to pt's niece Chanel. Spoke with Chanel on the telephone. Discussed average length of stay, insurance authorization requirement, discharge home after completion of CIR. Also discussed that pt does not appear able to tolerate the intensity of CIR at this time. Informed her that admissions team will follow to monitor pt's participation and progress with therapies. She acknowledged understanding. She is interested in pt pursuing CIR if pt becomes appropriate. She is going to discuss discharge plan with family in the event pt becomes appropriate for potential CIR admission. Will continue to follow.   Signed: Gayland Curry, [place], [place] Admissions Coordinator [phone]

## 2022-10-18 NOTE — Progress Notes (Signed)
STROKE TEAM PROGRESS NOTE   INTERVAL HISTORY Patient remains alert and interactive today.   She continues to have right gaze deviation and unable to look to the left past midline.  There is persistent dense left hemiplegia and partial left hemineglect.  Blood pressure is adequately controlled.   She remains on hypertonic saline and serum sodium is 147 this morning.  Her brother is at the bedside.  She has past bedside swallow eval for dysphagia 1 nectar thick liquid diet and speech therapy recommended modified barium Blood pressure adequately controlled.  Vital signs stable.  Vitals:   10/18/22 0900 10/18/22 1000 10/18/22 1100 10/18/22 1200  BP: (!) 144/95 (!) 144/95 137/80   Pulse: 60 79 84   Resp: 14 20 17    Temp:    99.2 F (37.3 C)  TempSrc:    Axillary  SpO2: 100% 100% 100%   Weight:      Height:       CBC:  Recent Labs  Lab 10/16/22 0412 10/16/22 0413 10/17/22 0550 10/18/22 0500  WBC 11.3*  --  17.2* 13.3*  NEUTROABS 9.1*  --   --   --   HGB 17.1*   < > 12.5 11.7*  HCT 51.3*   < > 36.4 35.4*  MCV 89.1  --  87.1 90.8  PLT 245  --  200 184   < > = values in this interval not displayed.   Basic Metabolic Panel:  Recent Labs  Lab 10/17/22 0550 10/17/22 1445 10/18/22 0500 10/18/22 1037  NA 150*   < > 147* 149*  K 3.9  --  3.3*  --   CL 120*  --  116*  --   CO2 24  --  25  --   GLUCOSE 135*  --  120*  --   BUN 16  --  10  --   CREATININE 0.80  --  0.73  --   CALCIUM 8.0*  --  8.3*  --   MG 2.2  --  2.4  --   PHOS 2.8  --  2.3*  --    < > = values in this interval not displayed.   Lipid Panel:  Recent Labs  Lab 10/18/22 0500  CHOL 159  TRIG 116  HDL 43  CHOLHDL 3.7  VLDL 23  LDLCALC 93   HgbA1c:  Recent Labs  Lab 10/18/22 0500  HGBA1C 6.0*   Urine Drug Screen:  Recent Labs  Lab 10/16/22 1414  LABOPIA NONE DETECTED  COCAINSCRNUR POSITIVE*  LABBENZ NONE DETECTED  AMPHETMU NONE DETECTED  THCU NONE DETECTED  LABBARB NONE DETECTED    Alcohol  Level  Recent Labs  Lab 10/16/22 0412  ETH <10    IMAGING past 24 hours ECHOCARDIOGRAM COMPLETE BUBBLE STUDY  Result Date: 10/18/2022    ECHOCARDIOGRAM REPORT   Patient Name:   Sheri Beck Date of Exam: 10/18/2022 Medical Rec #:  10/20/2022     Height:       62.0 in Accession #:    [id number]    Weight:       217.0 lb Date of Birth:  1966/05/20      BSA:          1.979 m Patient Age:    56 years      BP:           121/76 mmHg Patient Gender: F             HR:  61 bpm. Exam Location:  Inpatient Procedure: 2D Echo, Cardiac Doppler, Color Doppler and Saline Contrast Bubble            Study Indications:    stroke  History:        Patient has no prior history of Echocardiogram examinations.                 Risk Factors:Hypertension.  Sonographer:    Phineas Douglas Referring Phys: [phone] [place]  1. Left ventricular ejection fraction, by estimation, is 60 to 65%. The left ventricle has normal function. The left ventricle has no regional wall motion abnormalities. Left ventricular diastolic parameters were normal.  2. Right ventricular systolic function is normal. The right ventricular size is normal. There is normal pulmonary artery systolic pressure.  3. The mitral valve is abnormal. Mild mitral valve regurgitation. No evidence of mitral stenosis.  4. The aortic valve is tricuspid. There is mild calcification of the aortic valve. There is mild thickening of the aortic valve. Aortic valve regurgitation is not visualized. Aortic valve sclerosis is present, with no evidence of aortic valve stenosis.  5. The inferior vena cava is normal in size with greater than 50% respiratory variability, suggesting right atrial pressure of 3 mmHg.  6. Agitated saline contrast bubble study was negative, with no evidence of any interatrial shunt. FINDINGS  Left Ventricle: Left ventricular ejection fraction, by estimation, is 60 to 65%. The left ventricle has normal function. The left ventricle has no  regional wall motion abnormalities. The left ventricular internal cavity size was normal in size. There is  no left ventricular hypertrophy. Left ventricular diastolic parameters were normal. Right Ventricle: The right ventricular size is normal. No increase in right ventricular wall thickness. Right ventricular systolic function is normal. There is normal pulmonary artery systolic pressure. The tricuspid regurgitant velocity is 2.62 m/s, and  with an assumed right atrial pressure of 3 mmHg, the estimated right ventricular systolic pressure is 32.9 mmHg. Left Atrium: Left atrial size was normal in size. Right Atrium: Right atrial size was normal in size. Pericardium: There is no evidence of pericardial effusion. Mitral Valve: The mitral valve is abnormal. There is mild thickening of the mitral valve leaflet(s). Mild mitral valve regurgitation. No evidence of mitral valve stenosis. Tricuspid Valve: The tricuspid valve is normal in structure. Tricuspid valve regurgitation is mild . No evidence of tricuspid stenosis. Aortic Valve: The aortic valve is tricuspid. There is mild calcification of the aortic valve. There is mild thickening of the aortic valve. Aortic valve regurgitation is not visualized. Aortic valve sclerosis is present, with no evidence of aortic valve stenosis. Pulmonic Valve: The pulmonic valve was normal in structure. Pulmonic valve regurgitation is not visualized. No evidence of pulmonic stenosis. Aorta: The aortic root is normal in size and structure. Venous: The inferior vena cava is normal in size with greater than 50% respiratory variability, suggesting right atrial pressure of 3 mmHg. IAS/Shunts: No atrial level shunt detected by color flow Doppler. Agitated saline contrast was given intravenously to evaluate for intracardiac shunting. Agitated saline contrast bubble study was negative, with no evidence of any interatrial shunt.  LEFT VENTRICLE PLAX 2D LVIDd:         4.70 cm     Diastology LVIDs:          2.80 cm     LV e' medial:    7.62 cm/s LV PW:         0.70 cm  LV E/e' medial:  13.0 LV IVS:        1.00 cm     LV e' lateral:   15.20 cm/s LVOT diam:     1.80 cm     LV E/e' lateral: 6.5 LV SV:         61 LV SV Index:   31 LVOT Area:     2.54 cm  LV Volumes (MOD) LV vol d, MOD A2C: 97.0 ml LV vol d, MOD A4C: 97.3 ml LV vol s, MOD A2C: 35.2 ml LV vol s, MOD A4C: 38.8 ml LV SV MOD A2C:     61.8 ml LV SV MOD A4C:     97.3 ml LV SV MOD BP:      59.2 ml RIGHT VENTRICLE          IVC RV Basal diam:  3.70 cm  IVC diam: 1.60 cm TAPSE (M-mode): 2.2 cm LEFT ATRIUM             Index        RIGHT ATRIUM           Index LA diam:        3.20 cm 1.62 cm/m   RA Area:     12.40 cm LA Vol (A2C):   37.8 ml 19.10 ml/m  RA Volume:   24.70 ml  12.48 ml/m LA Vol (A4C):   36.3 ml 18.34 ml/m LA Biplane Vol: 38.0 ml 19.20 ml/m  AORTIC VALVE LVOT Vmax:   142.00 cm/s LVOT Vmean:  88.500 cm/s LVOT VTI:    0.238 m  AORTA Ao Root diam: 2.90 cm Ao Asc diam:  2.90 cm MITRAL VALVE                TRICUSPID VALVE MV Area (PHT): 5.27 cm     TR Peak grad:   27.5 mmHg MV Decel Time: 144 msec     TR Vmax:        262.00 cm/s MV E velocity: 99.10 cm/s MV A velocity: 111.00 cm/s  SHUNTS MV E/A ratio:  0.89         Systemic VTI:  0.24 m                             Systemic Diam: 1.80 cm Jenkins Rouge MD Electronically signed by Jenkins Rouge MD Signature Date/Time: 10/18/2022/9:59:08 AM    Final    [place] EKG SITE RITE  Result Date: 10/17/2022 If Site Rite image not attached, placement could not be confirmed due to current cardiac rhythm.  CT HEAD WO CONTRAST (5MM)  Result Date: 10/17/2022 CLINICAL DATA:  Neurologic change. EXAM: CT HEAD WITHOUT CONTRAST TECHNIQUE: Contiguous axial images were obtained from the base of the skull through the vertex without intravenous contrast. RADIATION DOSE REDUCTION: This exam was performed according to the departmental dose-optimization program which includes automated exposure control, adjustment of  the mA and/or kV according to patient size and/or use of iterative reconstruction technique. COMPARISON:  CT head without contrast 10/17/2022 at 4:35 a.m. FINDINGS: Brain: Large right MCA territory infarct is again noted. Patient is status post craniectomy. Areas of petechial hemorrhage near the superior aspect of the infarct are again seen. No larger hemorrhagic transformation is evident. Mass effect is increasing with further effacement of the right lateral ventricle. Although there is no leftward midline shift, the midline was previously to the right of midline after the craniectomy. It is now at the  midline consistent with increased mass effect. The basal cisterns are less patent than on the prior exam. Left hemisphere is unchanged. Progressive effacement of sulci and noted along the vermis. No downward herniation is present. CSF space anterior to the pons is preserved. Fourth ventricle is stable in size. Vascular: Hyperdense right MCA again noted. Skull: Right craniectomy. Similar appearance of edematous changes in the scalp overlying the craniectomy. Sinuses/Orbits: The paranasal sinuses and mastoid air cells are clear. Stable exophthalmos. No acute abnormality. IMPRESSION: 1. Evolving large right MCA territory infarct with petechial hemorrhage. No larger hemorrhagic transformation. 2. Increased mass effect with further effacement of the right lateral ventricle. Although there is no leftward midline shift, the midline was previously to the right of midline after the craniectomy. It is now at the midline consistent with increased mass effect. The basal cisterns are less patent than on the prior exam. The above was relayed via text pager to Dr. Elmer Picker on 10/17/2022 at 14:33. Electronically Signed   By: Marin Roberts M.D.   On: 10/17/2022 14:33    PHYSICAL EXAM  Constitutional: Appears well-developed and well-nourished.  [name] Cardiovascular: Normal rate and regular  rhythm.  Respiratory: snoring, rate/rhythm regular Skull shows right hemicraniectomy surgical defect with bandage surgical clips Neuro: Mental Status: Patient is lying with eyes closed, awakens to voice, and opens eyes partially, nods appropriately. Follows commands on the right , no verbal output on my exam. Nursing states that she did say her name when asked.  Right gaze preference and unable to look to the left past midline.  Partial left hemineglect Cranial Nerves: Eyes to the right with forced eye opening. Unable able to bring them midline.Left facial droop. Hearing is intact to voice Head turned to the right Motor: Moves right upper and lower extremity antigravity.  No movement noted on the left.  Sensory: Localizes to painful stimuli on the right. No movement with painful stimuli on the left. States she can not feel light or deep pain on the left but does acknowledge the left side    ASSESSMENT/PLAN Sheri Beck is a 57 y.o. female with history of hypertension presenting with left hemiplegia, depressed level of consciousness and rightward gaze.   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  Patient appears to be a rapid progresser of malignant cerebral edema with significant established edema within few hours of onset Etiology:  large vessel occlusion right ICA or right middle cerebral artery likely. Code Stroke CT head - Large Right MCA infarct. ASPECTS 2. No hemorrhagic transformation, and only minor intracranial mass effect at this time with no midline shift. CTA head & neck right M1 occlusion at its origin. CT Repeat 1/14 shows large right MCA infarct with cytotoxic edema and s/p right hemicraniectomy but no midline shift to the left.  Trace petechial hemorrhage at the periphery of the infarct. MRI  pending 2D Echo ejection fraction 60 to 65%. LDL 151 HgbA1c 6.3 VTE prophylaxis -  scds    Diet   DIET - DYS 1 Room service appropriate? No; Fluid consistency: Nectar Thick   No antithrombotic prior to admission, now on No antithrombotic.  Therapy recommendations:  Pending Disposition:  Pending   Brain Compression Hypertonic saline 50cc/hr S/p right frontal temporal parietal decompressive craniectomy with insertion of craniotomy flap into abdomen, JP drain in place  Hypertension Home meds:  Amlodipine 10mg , lisinopril-hydrochlorothiazide  Stable BP  goal less than 160 PRN hydralazine and labetalol  Long-term BP goal normotensive  Hyperlipidemia Home meds:  None, resumed in hospital LDL 151, goal < 70 Add statin when no longer NPO  Other Stroke Risk Factors Obesity, Body mass index is 39.69 kg/m., BMI >/= 30 associated with increased stroke risk, recommend weight loss, diet and exercise as appropriate   Other Active Problems   Hospital day # 2  Patient is showing gradual neurological improvement but CT scan yesterday shows significant persistent cytotoxic edema and mild right to left midline shift hence we will continue hypertonic saline with serum sodium goal 150-155.  Recommend close neurological follow-up and strict control of hypertension with systolic goal below 160   .  Marland Kitchen  Long discussion with the patient's brother at the bedside and answered questions..  .  Discussed with Dr.Olalere critical care medicine and Dr. Lovell Sheehan neurosurgery..This patient is critically ill and at significant risk of neurological worsening, death and care requires constant monitoring of vital signs, hemodynamics,respiratory and cardiac monitoring, extensive review of multiple databases, frequent neurological assessment, discussion with family, other specialists and medical decision making of high complexity.I have made any additions or clarifications directly to the above note.This critical care time does not reflect procedure time, or teaching time or supervisory time of PA/NP/Med  Resident etc but could involve care discussion time.    I spent 31 minutes of neurocritical care time  in the care of  this patient.      Delia Heady, MD Medical Director [facility] Pager: [phone] 10/18/2022 1:28 PM  To contact Stroke Continuity provider, please refer to [email or web address]. After hours, contact General Neurology

## 2022-10-18 NOTE — Progress Notes (Signed)
Speech Language Pathology Treatment: Dysphagia;Cognitive-Linquistic  Patient Details Name: Sheri Beck MRN: [id number] DOB: Sep 07, 1966 Today's Date: 10/18/2022 Time: 5366-4403 SLP Time Calculation (min) (ACUTE ONLY): 32 min  Assessment / Plan / Recommendation Clinical Impression  Pt was seen for dysphagia treatment. She was notably more alert than yesterday, but her eyes remained closed for most of the session (per neurology, due to eye-opening apraxia). Pt required prompts for declarative memory during the session, but sustained attention was improved. Speech intelligibility was improved with prompts for overarticulation, but her speech was notably more intelligible than during the evaluation. Pt's tolerance of thin liquids was improved with reduced signs of aspiration and intermittent elimination of aspiration symptoms when bolus sizes and rate were reduced. However, pt exhibited impulsivity throughout the session and her ability to consistently use these strategies was limited and symptoms were still noted even when symptoms were used. Left-sided anterior spillage was improved with prompts to reduce rate. Pt continues to demonstrate prolonged mastication and mild-moderate oral residue was noted. A modified barium swallow study is recommended to further assess physiology and it will be coordinated for today pending Radiology's schedule. Pt's current diet will be continued until the study is completed, but she may have ice chips and individual boluses of thin water between meals following oral care.    HPI HPI: Pt is a 57 year old female who presented to the ED on 1/13 due to left-sided weakness and slurred speech. 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 Risk for brain herniation and neurological death due to malignant cerebral edema thought to be high; pt s/p emergency right hemicraniectomy 1/13. PMH: HTN, prediabetes      SLP Plan  MBS      Recommendations for follow up therapy are one component  of a multi-disciplinary discharge planning process, led by the attending physician.  Recommendations may be updated based on patient status, additional functional criteria and insurance authorization.    Recommendations  Diet recommendations: Dysphagia 1 (puree);Nectar-thick liquid Liquids provided via: Cup;Straw Medication Administration: Whole meds with liquid Supervision: Patient able to self feed Compensations: Slow rate;Small sips/bites Postural Changes and/or Swallow Maneuvers: Seated upright 90 degrees                Oral Care Recommendations: Oral care BID Follow Up Recommendations:  (Continued SLP services at level of care recommended by PT/OT) Assistance recommended at discharge: Frequent or constant Supervision/Assistance SLP Visit Diagnosis: Dysphagia, unspecified (R13.10) Plan: MBS         Sheri Beck, [place], [place] Office number [phone]   Horton Marshall  10/18/2022, 9:57 AM

## 2022-10-18 NOTE — Progress Notes (Signed)
  Echocardiogram 2D Echocardiogram has been performed.  Sheri Beck B Ra Pfiester 10/18/2022, 10:56 AM 

## 2022-10-18 NOTE — Progress Notes (Signed)
Subjective: The patient is alert and pleasant.  She complains of a headache.  Objective: Vital signs in last 24 hours: Temp:  [98.9 F (37.2 C)-99.8 F (37.7 C)] 99.6 F (37.6 C) (01/15 0400) Pulse Rate:  [59-84] 65 (01/15 0600) Resp:  [12-33] 16 (01/15 0600) BP: (110-141)/(63-90) 133/80 (01/15 0600) SpO2:  [96 %-100 %] 100 % (01/15 0600) Estimated body mass index is 39.69 kg/m as calculated from the following:   Height as of this encounter: 5\' 2"  (1.575 m).   Weight as of this encounter: 98.4 kg.   Intake/Output from previous day: 01/14 0701 - 01/15 0700 In: 445.7 [I.V.:445.7] Out: 1345 [Urine:1290; Drains:55] Intake/Output this shift: No intake/output data recorded.  Physical exam the patient is alert and oriented to person, place, month, year.  Her pupils are equal.  She is left hemiplegic.  She moves her right extremities well. Lab Results: Recent Labs    10/17/22 0550 10/18/22 0500  WBC 17.2* 13.3*  HGB 12.5 11.7*  HCT 36.4 35.4*  PLT 200 184   BMET Recent Labs    10/17/22 0550 10/17/22 1445 10/18/22 0205 10/18/22 0500  NA 150*   < > 148* 147*  K 3.9  --   --  3.3*  CL 120*  --   --  116*  CO2 24  --   --  25  GLUCOSE 135*  --   --  120*  BUN 16  --   --  10  CREATININE 0.80  --   --  0.73  CALCIUM 8.0*  --   --  8.3*   < > = values in this interval not displayed.    Studies/Results: [place] EKG SITE RITE  Result Date: 10/17/2022 If Site Rite image not attached, placement could not be confirmed due to current cardiac rhythm.  CT HEAD WO CONTRAST (5MM)  Result Date: 10/17/2022 CLINICAL DATA:  Neurologic change. EXAM: CT HEAD WITHOUT CONTRAST TECHNIQUE: Contiguous axial images were obtained from the base of the skull through the vertex without intravenous contrast. RADIATION DOSE REDUCTION: This exam was performed according to the departmental dose-optimization program which includes automated exposure control, adjustment of the mA and/or kV according to  patient size and/or use of iterative reconstruction technique. COMPARISON:  CT head without contrast 10/17/2022 at 4:35 a.m. FINDINGS: Brain: Large right MCA territory infarct is again noted. Patient is status post craniectomy. Areas of petechial hemorrhage near the superior aspect of the infarct are again seen. No larger hemorrhagic transformation is evident. Mass effect is increasing with further effacement of the right lateral ventricle. Although there is no leftward midline shift, the midline was previously to the right of midline after the craniectomy. It is now at the midline consistent with increased mass effect. The basal cisterns are less patent than on the prior exam. Left hemisphere is unchanged. Progressive effacement of sulci and noted along the vermis. No downward herniation is present. CSF space anterior to the pons is preserved. Fourth ventricle is stable in size. Vascular: Hyperdense right MCA again noted. Skull: Right craniectomy. Similar appearance of edematous changes in the scalp overlying the craniectomy. Sinuses/Orbits: The paranasal sinuses and mastoid air cells are clear. Stable exophthalmos. No acute abnormality. IMPRESSION: 1. Evolving large right MCA territory infarct with petechial hemorrhage. No larger hemorrhagic transformation. 2. Increased mass effect with further effacement of the right lateral ventricle. Although there is no leftward midline shift, the midline was previously to the right of midline after the craniectomy. It is now at  the midline consistent with increased mass effect. The basal cisterns are less patent than on the prior exam. The above was relayed via text pager to Dr. Janine Ores on 10/17/2022 at 14:33. Electronically Signed   By: San Morelle M.D.   On: 10/17/2022 14:33   CT ANGIO HEAD NECK W WO CM  Addendum Date: 10/17/2022   ADDENDUM REPORT: 10/17/2022 06:16 ADDENDUM: Study discussed by telephone with Dr. Cheral Marker at [place] am on 10/17/2022.  Electronically Signed   By: Genevie Ann M.D.   On: 10/17/2022 06:16   Result Date: 10/17/2022 CLINICAL DATA:  57 year old female code stroke presentation yesterday morning. Large right MCA infarct. Postoperative day 1 decompressive craniotomy for right MCA brain edema. EXAM: CT ANGIOGRAPHY HEAD AND NECK TECHNIQUE: Multidetector CT imaging of the head and neck was performed using the standard protocol during bolus administration of intravenous contrast. Multiplanar CT image reconstructions and MIPs were obtained to evaluate the vascular anatomy. Carotid stenosis measurements (when applicable) are obtained utilizing NASCET criteria, using the distal internal carotid diameter as the denominator. RADIATION DOSE REDUCTION: This exam was performed according to the departmental dose-optimization program which includes automated exposure control, adjustment of the mA and/or kV according to patient size and/or use of iterative reconstruction technique. CONTRAST:  2mL OMNIPAQUE IOHEXOL 350 MG/ML SOLN COMPARISON:  Presentation head CT 10/16/2022. 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 Interval decompressive craniectomy with mild brain herniation through the skull defect (series 15, image 19). Increased mass effect on the right lateral ventricle, especially the frontal horn, but there is no leftward midline shift. Basilar cisterns remain patent. Hyperdense right MCA and its branches. There are scattered small foci of petechial hemorrhage along the periphery of the infarcted right hemisphere (series 15, images 21, 22, 23). No malignant hemorrhagic transformation. Elsewhere gray-white differentiation is stable. Small chronic right cerebellar infarct. Calvarium and skull base: New long segment right craniectomy. Paranasal sinuses: Visualized paranasal sinuses and mastoids are stable and well aerated. Orbits: Postoperative changes to the right scalp. Postoperative  scalp drain in place. Overlying skin staples. Orbits soft tissues are stable, negative. CTA NECK Skeleton: Right craniectomy described above. No other No acute osseous abnormality identified. Upper chest: Negative, lung apices are fairly clear. Other neck: Pharynx motion artifact. No other acute finding in the neck. Aortic arch: 3 vessel arch configuration with minimal distal arch atherosclerosis. Right carotid system: Mildly tortuous brachiocephalic artery and right CCA origin. Motion artifact at the right carotid bifurcation. But no significant plaque or stenosis identified. Left carotid system: Similar mild tortuosity and left carotid bifurcation motion artifact but no plaque or stenosis is identified. Vertebral arteries: Proximal subclavian arteries and vertebral artery origins are normal. Codominant vertebral arteries with tortuous V1 segments, but no vertebral plaque or stenosis to the skull base. CTA HEAD Posterior circulation: Distal vertebral arteries and vertebrobasilar junction are normal. Normal left PICA origin and dominant appearing right AICA origin. Patent basilar artery without stenosis. Patent SCA and PCA origins. Posterior communicating arteries are diminutive or absent. Bilateral PCA branches are within normal limits. Anterior circulation: Both ICA siphons are patent. The left siphon appears normal to the carotid terminus. The right ICA siphon caliber is mildly diminished throughout. But there is no significant siphon plaque or stenosis. Right ICA terminus and right ACA origin remain patent but the MCA origin is occluded. There is minimal right MCA collateral enhancement. Contralateral left ICA terminus, left MCA and ACA origin are normal. A1 segments,  anterior communicating artery, and bilateral ACA branches are within normal limits. Left MCA M1 segment and bifurcation are patent without stenosis. Left MCA branches are within normal limits. Venous sinuses: Early contrast timing. The superior  sagittal sinus, transverse and sigmoid sinuses appear grossly patent. Anatomic variants: None. Review of the MIP images confirms the above findings IMPRESSION: 1. Large Right MCA infarct with progressive cytotoxic edema, but no leftward midline shift following decompressive craniectomy. Basilar cisterns remain patent. 2. Mild petechial hemorrhage along the periphery of the infarct. But no malignant hemorrhagic transformation. 3. Confluent occlusion of the Right MCA at its origin. Right ICA terminus and Right ACA remain patent. And virtually NO atherosclerosis in the head or neck. Given other evidence of chronic cerebral ischemia (right cerebellum), consider the possibility of a PFO or similar thromboembolic mechanism. Salient findings were communicated to Dr. Otelia Limes at 5:47 am on 10/17/2022 by text page via the [facility] messaging system. Electronically Signed: By: Odessa Fleming M.D. On: 10/17/2022 05:50    Assessment/Plan: Large right MCA stroke, status post decompressive craniectomy: The patient is stable.  It looks like she will need rehab.  LOS: 2 days     Cristi Loron 10/18/2022, 8:11 AM     Patient ID: Sheri Beck, female   DOB: Mar 11, 1966, 57 y.o.   MRN: [id number]

## 2022-10-18 NOTE — Progress Notes (Signed)
Modified Barium Swallow Progress Note  Patient Details  Name: Sheri Beck MRN: [id number] Date of Birth: 1966/02/20  Today's Date: 10/18/2022  Modified Barium Swallow completed.  Full report located under Chart Review in the Imaging Section.  Brief recommendations include the following:  Clinical Impression  Visualization during the study was challenging due to pt's frequent superior movement of her right arm and her right clavicle intermittently obscuring the view of the larynx. Pt presents with oropharyngeal dysphagia characterized by reduced labial seal, weak lingual manipulation, impaired mastication, reduced bolus cohesion, reduced posterior bolus propulsion, and a pharyngeal delay. She demonstrated left-sided anterior spillage, difficulty with A-P transport of solids, a munching masticatory pattern, premature spillage to the valleculae and pyriform sinuses, and the swallow was often triggered with the head of liquid boluses in the hypopharynx. Penetration (PAS 3) and aspiration (PAS 7) were noted with thin liquids secondary to the pharyngeal delay. Aspiration triggered throat clearing and a delayed cough. Laryngeal invasion was improved to PAS 5 with use of a chin tuck. Penetration and aspiration were eliminated with use of individual boluses, but pt exhibited significant difficulty consistently using this strategy. With prompts, she often reduced the number of consecutive swallows to three and aspiration of penetrated material was then noted by the second/third consecutive swallow. A dysphagia 2 diet with nectar thick liquid is recommended at this time with observance of swallowing precautions. Prognosis for advancement is judged to be good with intervention; SLP will continue to follow pt.    Swallow Evaluation Recommendations       SLP Diet Recommendations: Dysphagia 2 (Fine chop) solids;Nectar thick liquid   Liquid Administration via: Cup;Straw   Medication Administration: Whole meds  with liquid   Supervision: Staff to assist with self feeding;Full supervision/cueing for compensatory strategies   Compensations: Slow rate;Small sips/bites;Follow solids with liquid;Monitor for anterior loss   Postural Changes: Seated upright at 90 degrees       Other Recommendations: Order thickener from pharmacy   [place] I. Hardin Negus, [place], [place] Office number [place] 10/18/2022,4:02 PM

## 2022-10-18 NOTE — Progress Notes (Signed)
NAME:  Sheri Beck, MRN:  [id number], DOB:  12-18-1965, LOS: 2 ADMISSION DATE:  10/16/2022, CONSULTATION DATE:  1/13 REFERRING MD:  Dr. Randal Buba, CHIEF COMPLAINT:  R MCA   History of Present Illness:  Patient is a 57 year old female with pertinent PMH of HTN, prediabetes presents to [facility] ED on 1/13 code stroke.  Patient's LKN on 1/13 at 65.  Husband witnessed patient having left facial droop and slurred speech.  EMS called and witnessed patient with left-sided weakness and lethargy; unequal pupils.  Patient transported to [facility] as code stroke.  Upon arrival to [facility], patient's vital stable.  Neuro consulted.  Patient with left-sided deficits.  CT head showing large right MCA infarct. Not tnk candidate due to risk of hemorrhagic transformation.  High risk of developing significant edema. NSG consulted and planning to start patient on hypertonic saline. Labs pending. PCCM consulted for icu admission.  NSY took to OR for hemicrani. On arrival to ICU patient with loud snoring, eyelid apraxia, when awakens follows commands on right hemibody. Left with no motor movement. On 50 ml/hr 3%.   Pertinent  Medical History  HTN, pre-diabetes   Significant Hospital Events: Including procedures, antibiotic start and stop dates in addition to other pertinent events   1/13 large R mca stroke w/ cerebral edema; neuro and nsg following; hypertonic saline for edema; pccm to admit  Interim History / Subjective:  No overnight events Sleepy when I saw her this morning  Objective   Blood pressure (!) 145/76, pulse 68, temperature 99.6 F (37.6 C), temperature source Axillary, resp. rate 16, height 5\' 2"  (1.575 m), weight 98.4 kg, SpO2 100 %.        Intake/Output Summary (Last 24 hours) at 10/18/2022 7829 Last data filed at 10/18/2022 0800 Gross per 24 hour  Intake 521.85 ml  Output 1105 ml  Net -583.15 ml   Filed Weights   10/16/22 0840  Weight: 98.4 kg    Examination: General: Middle-aged, does not  appear to be an extremis HEENT: Moist oral mucosa, JP drain and sutures noted in the right cranial Neuro: Moving right side, no movement on the left CV: S1-S2 appreciated PULM: Clear breath sounds GI: soft, active bowel sounds  Extremities: Warm and dry Skin: no rashes or lesions appreciated  Resolved Hospital Problem list     Assessment & Plan:   Right MCA stroke -Concern for significant edema with mass effect and possible herniation -S/p craniotomy 1/13 -CTA 1/14 with large right MCA infarct and progressive cytotoxic edema but no leftward midline shift following decompressive craniectomy.  No malignant hemorrhagic transformation  UDS positive for cocaine -Frequent neurochecks -Continue neuroprotective measures including normothermia, euglycemia, head of the bed elevation greater than 30 degrees, normocapnia, normoxia -PT/OT/SLP-May require cortrak -Echocardiogram with bubble study pending  Hypernatremia in the setting of hypertonic saline infusion -Currently off 3% saline -Most recent sodium 147  Hypertension -Goal systolic blood pressure less than 160 -Home Norvasc, Lisinopril Hydrochlorothiazide on hold  Prediabetes with HbA1c of 6.0 -Continue CBG monitoring  Leukocytosis This is likely reactive -Continue to trend, trend fever curve  On dysphagia diet Best Practice (right click and "Reselect all SmartList Selections" daily)   Diet/type: dysphagia diet (see orders) DVT prophylaxis: SCD GI prophylaxis: N/A Lines: N/A Foley:  D/C  Code Status:  full code Last date of multidisciplinary goals of care discussion [1/13 spoke with husband and patient at bedside]  Labs   CBC: Recent Labs  Lab 10/16/22 0412 10/16/22 0413 10/16/22 0501 10/17/22  0550 10/18/22 0500  WBC 11.3*  --   --  17.2* 13.3*  NEUTROABS 9.1*  --   --   --   --   HGB 17.1* 17.3* 18.0* 12.5 11.7*  HCT 51.3* 51.0* 53.0* 36.4 35.4*  MCV 89.1  --   --  87.1 90.8  PLT 245  --   --  200 184     Basic Metabolic Panel: Recent Labs  Lab 10/16/22 0413 10/16/22 0501 10/16/22 0553 10/16/22 1407 10/17/22 0550 10/17/22 1445 10/17/22 2025 10/18/22 0205 10/18/22 0500  NA 138 141 139   < > 150* 148* 148* 148* 147*  K 7.0* 3.8 4.7  --  3.9  --   --   --  3.3*  CL 109 113* 105  --  120*  --   --   --  116*  CO2  --   --  23  --  24  --   --   --  25  GLUCOSE 161* 138* 118*  --  135*  --   --   --  120*  BUN 42* 29* 27*  --  16  --   --   --  10  CREATININE 0.90 0.90 0.97  --  0.80  --   --   --  0.73  CALCIUM  --   --  8.9  --  8.0*  --   --   --  8.3*  MG  --   --  2.3  --  2.2  --   --   --  2.4  PHOS  --   --   --   --  2.8  --   --   --  2.3*   < > = values in this interval not displayed.   GFR: Estimated Creatinine Clearance: 84 mL/min (by C-G formula based on SCr of 0.73 mg/dL). Recent Labs  Lab 10/16/22 0412 10/17/22 0550 10/18/22 0500  WBC 11.3* 17.2* 13.3*    Liver Function Tests: Recent Labs  Lab 10/16/22 0553  AST 25  ALT 23  ALKPHOS 57  BILITOT 0.7  PROT 6.7  ALBUMIN 3.8   No results for input(s): "LIPASE", "AMYLASE" in the last 168 hours. No results for input(s): "AMMONIA" in the last 168 hours.  ABG    Component Value Date/Time   TCO2 16 (L) 10/16/2022 0501     Coagulation Profile: Recent Labs  Lab 10/16/22 0646  INR 1.0    Cardiac Enzymes: No results for input(s): "CKTOTAL", "CKMB", "CKMBINDEX", "TROPONINI" in the last 168 hours.  HbA1C: Hgb A1c MFr Bld  Date/Time Value Ref Range Status  10/18/2022 05:00 AM 6.0 (H) 4.8 - 5.6 % Final    Comment:    (NOTE) Pre diabetes:          5.7%-6.4%  Diabetes:              >6.4%  Glycemic control for   <7.0% adults with diabetes   10/05/2022 07:15 AM 6.3 4.6 - 6.5 % Final    Comment:    Glycemic Control Guidelines for People with Diabetes:Non Diabetic:  <6%Goal of Therapy: <7%Additional Action Suggested:  >8%     CBG: Recent Labs  Lab 10/17/22 1612 10/17/22 1957 10/17/22 2347  10/18/22 0400 10/18/22 0745  GLUCAP 133* 141* 112* 125* 147*   Sherrilyn Rist, MD [facility] Pager: See Shea Evans

## 2022-10-19 ENCOUNTER — Inpatient Hospital Stay (HOSPITAL_COMMUNITY): Payer: Medicaid Other

## 2022-10-19 DIAGNOSIS — I639 Cerebral infarction, unspecified: Secondary | ICD-10-CM

## 2022-10-19 DIAGNOSIS — I63031 Cerebral infarction due to thrombosis of right carotid artery: Secondary | ICD-10-CM | POA: Diagnosis not present

## 2022-10-19 LAB — CBC
HCT: 33.9 % — ABNORMAL LOW (ref 36.0–46.0)
Hemoglobin: 11.3 g/dL — ABNORMAL LOW (ref 12.0–15.0)
MCH: 30 pg (ref 26.0–34.0)
MCHC: 33.3 g/dL (ref 30.0–36.0)
MCV: 89.9 fL (ref 80.0–100.0)
Platelets: 180 10*3/uL (ref 150–400)
RBC: 3.77 MIL/uL — ABNORMAL LOW (ref 3.87–5.11)
RDW: 13.8 % (ref 11.5–15.5)
WBC: 11.7 10*3/uL — ABNORMAL HIGH (ref 4.0–10.5)
nRBC: 0 % (ref 0.0–0.2)

## 2022-10-19 LAB — BASIC METABOLIC PANEL
Anion gap: 11 (ref 5–15)
BUN: 11 mg/dL (ref 6–20)
CO2: 24 mmol/L (ref 22–32)
Calcium: 8.7 mg/dL — ABNORMAL LOW (ref 8.9–10.3)
Chloride: 112 mmol/L — ABNORMAL HIGH (ref 98–111)
Creatinine, Ser: 0.62 mg/dL (ref 0.44–1.00)
GFR, Estimated: >60 mL/min (ref >60)
Glucose, Bld: 115 mg/dL — ABNORMAL HIGH (ref 70–99)
Potassium: 3.4 mmol/L — ABNORMAL LOW (ref 3.5–5.1)
Sodium: 147 mmol/L — ABNORMAL HIGH (ref 135–145)

## 2022-10-19 LAB — GLUCOSE, CAPILLARY
Glucose-Capillary: 102 mg/dL — ABNORMAL HIGH (ref 70–99)
Glucose-Capillary: 110 mg/dL — ABNORMAL HIGH (ref 70–99)
Glucose-Capillary: 113 mg/dL — ABNORMAL HIGH (ref 70–99)
Glucose-Capillary: 115 mg/dL — ABNORMAL HIGH (ref 70–99)
Glucose-Capillary: 122 mg/dL — ABNORMAL HIGH (ref 70–99)
Glucose-Capillary: 130 mg/dL — ABNORMAL HIGH (ref 70–99)
Glucose-Capillary: 153 mg/dL — ABNORMAL HIGH (ref 70–99)

## 2022-10-19 LAB — SODIUM
Sodium: 147 mmol/L — ABNORMAL HIGH (ref 135–145)
Sodium: 145 mmol/L (ref 135–145)
Sodium: 145 mmol/L (ref 135–145)
Sodium: 147 mmol/L — ABNORMAL HIGH (ref 135–145)

## 2022-10-19 LAB — PHOSPHORUS: Phosphorus: 2.6 mg/dL (ref 2.5–4.6)

## 2022-10-19 LAB — MAGNESIUM: Magnesium: 2.2 mg/dL (ref 1.7–2.4)

## 2022-10-19 MED ORDER — POTASSIUM CHLORIDE 20 MEQ PO PACK
40.0000 meq | PACK | Freq: Once | ORAL | Status: AC
  Administered 2022-10-19: 40 meq via ORAL
  Filled 2022-10-19: qty 2

## 2022-10-19 NOTE — Plan of Care (Signed)
  Problem: Education: Goal: Knowledge of the prescribed therapeutic regimen will improve Outcome: Progressing   Problem: Clinical Measurements: Goal: Usual level of consciousness will be regained or maintained. Outcome: Progressing Goal: Neurologic status will improve Outcome: Progressing Goal: Ability to maintain intracranial pressure will improve Outcome: Progressing   Problem: Skin Integrity: Goal: Demonstration of wound healing without infection will improve Outcome: Progressing   Problem: Education: Goal: Knowledge of disease or condition will improve Outcome: Progressing Goal: Knowledge of secondary prevention will improve (MUST DOCUMENT ALL) Outcome: Progressing Goal: Knowledge of patient specific risk factors will improve Elta [place] N/A or DELETE if not current risk factor) Outcome: Progressing   Problem: Ischemic Stroke/TIA Tissue Perfusion: Goal: Complications of ischemic stroke/TIA will be minimized Outcome: Progressing   Problem: Health Behavior/Discharge Planning: Goal: Ability to manage health-related needs will improve Outcome: Progressing Goal: Goals will be collaboratively established with patient/family Outcome: Progressing   Problem: Self-Care: Goal: Ability to participate in self-care as condition permits will improve Outcome: Progressing Goal: Verbalization of feelings and concerns over difficulty with self-care will improve Outcome: Progressing Goal: Ability to communicate needs accurately will improve Outcome: Progressing   Problem: Health Behavior/Discharge Planning: Goal: Ability to manage health-related needs will improve Outcome: Progressing   Problem: Education: Goal: Knowledge of General Education information will improve Description: Including pain rating scale, medication(s)/side effects and non-pharmacologic comfort measures Outcome: Progressing   Problem: Clinical Measurements: Goal: Ability to maintain clinical measurements within  normal limits will improve Outcome: Progressing Goal: Will remain free from infection Outcome: Progressing Goal: Diagnostic test results will improve Outcome: Progressing Goal: Respiratory complications will improve Outcome: Progressing Goal: Cardiovascular complication will be avoided Outcome: Progressing   Problem: Pain Managment: Goal: General experience of comfort will improve Outcome: Progressing

## 2022-10-19 NOTE — Progress Notes (Signed)
Lower extremity venous bilateral study completed.  Preliminary results relayed to [place], RN and de Yolanda Manges, NP.   See CV Proc for preliminary results report.   Darlin Coco, RDMS, RVT

## 2022-10-19 NOTE — Progress Notes (Signed)
STROKE TEAM PROGRESS NOTE   INTERVAL HISTORY Patient remains alert and interactive today.   She continues to have right gaze deviation and unable to look to the left past midline.  There is persistent dense left hemiplegia and partial left hemineglect.  Blood pressure is adequately controlled.   She remains on hypertonic saline and serum sodium is 147 this morning.  And her hypertonic saline drip rate has been reduced to 25 cc/h this morning Blood pressure adequately controlled.  Vital signs stable.  Vitals:   10/19/22 1200 10/19/22 1300 10/19/22 1400 10/19/22 1500  BP: 138/84 (!) 150/82 (!) 151/86 (!) 156/96  Pulse: 73 60 69 80  Resp: (!) 21 16 15 20   Temp: 98.9 F (37.2 C)     TempSrc: Axillary     SpO2: 100% 100% 100% 100%  Weight:      Height:       CBC:  Recent Labs  Lab 10/16/22 0412 10/16/22 0413 10/18/22 0500 10/19/22 0614  WBC 11.3*   < > 13.3* 11.7*  NEUTROABS 9.1*  --   --   --   HGB 17.1*   < > 11.7* 11.3*  HCT 51.3*   < > 35.4* 33.9*  MCV 89.1   < > 90.8 89.9  PLT 245   < > 184 180   < > = values in this interval not displayed.   Basic Metabolic Panel:  Recent Labs  Lab 10/18/22 0500 10/18/22 1037 10/19/22 0614 10/19/22 0834  NA 147*   < > 147* 145  K 3.3*  --  3.4*  --   CL 116*  --  112*  --   CO2 25  --  24  --   GLUCOSE 120*  --  115*  --   BUN 10  --  11  --   CREATININE 0.73  --  0.62  --   CALCIUM 8.3*  --  8.7*  --   MG 2.4  --  2.2  --   PHOS 2.3*  --  2.6  --    < > = values in this interval not displayed.   Lipid Panel:  Recent Labs  Lab 10/18/22 0500  CHOL 159  TRIG 116  HDL 43  CHOLHDL 3.7  VLDL 23  LDLCALC 93   HgbA1c:  Recent Labs  Lab 10/18/22 0500  HGBA1C 6.0*   Urine Drug Screen:  Recent Labs  Lab 10/16/22 1414  LABOPIA NONE DETECTED  COCAINSCRNUR POSITIVE*  LABBENZ NONE DETECTED  AMPHETMU NONE DETECTED  THCU NONE DETECTED  LABBARB NONE DETECTED    Alcohol Level  Recent Labs  Lab 10/16/22 0412  ETH <10     IMAGING past 24 hours No results found.  PHYSICAL EXAM  Constitutional: Appears well-developed and well-nourished.  [name] Cardiovascular: Normal rate and regular rhythm.  Respiratory: snoring, rate/rhythm regular Skull shows right hemicraniectomy surgical defect with bandage surgical clips Neuro: Mental Status: Patient is lying with eyes open., nods appropriately. Follows commands on the right , no verbal output on my exam. Nursing states that she did say her name when asked.  Right gaze preference and unable to look to the left past midline.  Partial left hemineglect.  Speech is clear today with minimal dysarthria. Cranial Nerves: Eyes to the right with forced eye opening. Unable able to bring them midline.Left facial droop. Hearing is intact to voice Head turned to the right Motor: Moves right upper and lower extremity antigravity.  No movement noted  on the left.  Sensory: Localizes to painful stimuli on the right. No movement with painful stimuli on the left. States she can not feel light or deep pain on the left but does acknowledge the left side    ASSESSMENT/PLAN Sheri Beck is a 57 y.o. female with history of hypertension presenting with left hemiplegia, depressed level of consciousness and rightward gaze.   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  Patient appears to be a rapid progresser of malignant cerebral edema with significant established edema within few hours of onset Etiology:  large vessel occlusion right ICA or right middle cerebral artery likely. Code Stroke CT head - Large Right MCA infarct. ASPECTS 2. No hemorrhagic transformation, and only minor intracranial mass effect at this time with no midline shift. CTA head & neck right M1 occlusion at its origin. CT Repeat 1/14 shows large right MCA infarct with cytotoxic edema and s/p  right hemicraniectomy but no midline shift to the left.  Trace petechial hemorrhage at the periphery of the infarct. MRI  pending 2D Echo ejection fraction 60 to 65%. LDL 151 HgbA1c 6.3 VTE prophylaxis - scds    Diet   DIET DYS 2 Room service appropriate? No; Fluid consistency: Nectar Thick   No antithrombotic prior to admission, now on No antithrombotic.  Therapy recommendations:  Pending Disposition:  Pending   Brain Compression Hypertonic saline 50cc/hr S/p right frontal temporal parietal decompressive craniectomy with insertion of craniotomy flap into abdomen, JP drain in place  Hypertension Home meds:  Amlodipine 10mg , lisinopril-hydrochlorothiazide  Stable BP goal less than 160 PRN hydralazine and labetalol  Long-term BP goal normotensive  Hyperlipidemia Home meds:  None, resumed in hospital LDL 151, goal < 70 Add statin when no longer NPO  Other Stroke Risk Factors Obesity, Body mass index is 39.69 kg/m., BMI >/= 30 associated with increased stroke risk, recommend weight loss, diet and exercise as appropriate   Other Active Problems   Hospital day # 3  Patient continues to show gradual neurological improvement hence will start tapering hypertonic saline over the next 24 hours.  Repeat CT head tomorrow morning.  Recommend close neurological follow-up and strict control of hypertension with systolic goal below 009   .  Marland Kitchen  Long discussion with the patient's brother at the bedside and answered questions..  .  Discussed with Dr.Olalere critical care medicine and Dr. Arnoldo Morale neurosurgery..This patient is critically ill and at significant risk of neurological worsening, death and care requires constant monitoring of vital signs, hemodynamics,respiratory and cardiac monitoring, extensive review of multiple databases, frequent neurological assessment, discussion with family, other specialists and medical decision making of high complexity.I have made any additions or  clarifications directly to the above note.This critical care time does not reflect procedure time, or teaching time or supervisory time of PA/NP/Med Resident etc but could involve care discussion time.    I spent 30 minutes of neurocritical care time  in the care of  this patient.      Antony Contras, MD Medical Director [facility] Pager: [phone] 10/19/2022 3:42 PM  To contact Stroke Continuity provider, please refer to [email or web address] After hours, contact General Neurology

## 2022-10-19 NOTE — Progress Notes (Signed)
   Providing Compassionate, Quality Care - Together  Gauze/tape dressing removed. There is a small amount of dried sanguinous drainage. Incision is closed with staples. JP drain removed without issue. Drain site is clean, dry, and intact. Incision was left open to air.  Now that the JP drain is out, Lovenox can be started at any time. Will defer to Neurology     Viona Gilmore, DNP, AGNP-C Nurse Practitioner 10/19/2022, 11:28 AM  [facility] [place] [address], [address], [place], [place] [address] P: [phone]    F: [phone]

## 2022-10-19 NOTE — NC FL2 (Signed)
[place] LEVEL OF CARE FORM     IDENTIFICATION  Patient Name: Sheri Beck Birthdate: 1966/05/19 Sex: female Admission Date (Current Location): 10/16/2022  [facility] and [place] Number:  Herbalist and Address:  [facility], [place] [address], [place], [place] [address]      Provider Number: [id number]  Attending Physician Name and Address:  Newman Pies, MD  Relative Name and Phone Number:       Current Level of Care: Hospital Recommended Level of Care: [place] Prior Approval Number:    Date Approved/Denied:   PASRR Number: [id number]  Discharge Plan: SNF    Current Diagnoses: Patient Active Problem List   Diagnosis Date Noted   Stroke [facility]) 10/16/2022   Stroke (cerebrum) ([place]) 10/16/2022   Hyperlipidemia 09/28/2022   Severe obesity (BMI 35.0-39.9) with comorbidity ([place]) 09/28/2022   Acute bacterial conjunctivitis of right eye 05/06/2020   Dental abscess 05/06/2020   Essential hypertension 05/06/2020    Orientation RESPIRATION BLADDER Height & Weight     Self, Time, Situation, Place  Normal Continent, External catheter Weight: 217 lb (98.4 kg) Height:  5\' 2"  (157.5 cm)  BEHAVIORAL SYMPTOMS/MOOD NEUROLOGICAL BOWEL NUTRITION STATUS      Continent Diet (see dc summary)  AMBULATORY STATUS COMMUNICATION OF NEEDS Skin   Extensive Assist Verbally Surgical wounds (closed incision on abdomen and head)                       Personal Care Assistance Level of Assistance  Bathing, Feeding, Dressing Bathing Assistance: Maximum assistance Feeding assistance: Independent Dressing Assistance: Limited assistance     Functional Limitations Info             SPECIAL CARE FACTORS FREQUENCY  PT (By licensed PT), OT (By licensed OT)     PT Frequency: 5x OT Frequency: 5x            Contractures Contractures Info: Not present    Additional Factors Info  Code Status, Allergies Code Status  Info: Full Allergies Info: NKA           Current Medications (10/19/2022):  This is the current hospital active medication list Current Facility-Administered Medications  Medication Dose Route Frequency Provider Last Rate Last Admin   acetaminophen (TYLENOL) tablet 650 mg  650 mg Oral Q4H PRN Newman Pies, MD   650 mg at 10/19/22 0815   Or   acetaminophen (TYLENOL) suppository 650 mg  650 mg Rectal Q4H PRN Newman Pies, MD       Chlorhexidine Gluconate Cloth 2 % PADS 6 each  6 each Topical Daily Newman Pies, MD   6 each at 10/19/22 0914   heparin injection 5,000 Units  5,000 Units Subcutaneous Q8H Garvin Fila, MD   5,000 Units at 10/19/22 1409   hydrALAZINE (APRESOLINE) injection 10-40 mg  10-40 mg Intravenous Q4H PRN Omar Person, NP       labetalol (NORMODYNE) injection 10-40 mg  10-40 mg Intravenous Q4H PRN Omar Person, NP   20 mg at 10/16/22 1920   ondansetron (ZOFRAN) tablet 4 mg  4 mg Oral Q4H PRN Newman Pies, MD       Or   ondansetron [facility]) injection 4 mg  4 mg Intravenous Q4H PRN Newman Pies, MD       Oral care mouth rinse  15 mL Mouth Rinse 4 times per day Kerney Elbe, MD   15 mL at 10/19/22  1205   Oral care mouth rinse  15 mL Mouth Rinse PRN Kerney Elbe, MD       pantoprazole (PROTONIX) injection 40 mg  40 mg Intravenous QHS Newman Pies, MD   40 mg at 10/18/22 2103   promethazine (PHENERGAN) tablet 12.5-25 mg  12.5-25 mg Oral Q4H PRN Newman Pies, MD       sodium chloride (hypertonic) 3 % solution   Intravenous Continuous Newman Pies, MD 25 mL/hr at 10/19/22 1500 Infusion Verify at 10/19/22 1500   sodium chloride flush (NS) 0.9 % injection 10-40 mL  10-40 mL Intracatheter Q12H Newman Pies, MD   10 mL at 10/19/22 0914   sodium chloride flush (NS) 0.9 % injection 10-40 mL  10-40 mL Intracatheter PRN Newman Pies, MD         Discharge Medications: Please see discharge summary for a list of discharge  medications.  Relevant Imaging Results:  Relevant Lab Results:   Additional Information SSN: [id number]  Benard Halsted, LCSW

## 2022-10-19 NOTE — Progress Notes (Signed)
Physical Therapy Treatment Patient Details Name: Sheri Beck MRN: [id number] DOB: [birth year] Today's Date: 10/19/2022   History of Present Illness 57 yo female presents to [facility] on 1/13 with L facial droop, slurred speech, L weakness, UDS + cocaine. CTH shows large R MCA CVA. S/p R frontal temporal parietal decompressive craniectomy, insertion of craniotomy flap in abdomen on 1/13. PMHx: HTN, HLD, substance use    PT Comments    Patient progressing with mobility and able to stand several attempts today.  Still with R gaze preference and neglect on L, but eager to progress and able to demonstrate some weight acceptance on L LE in standing trials.  She remains appropriate for Acute inpatient rehab prior to d/c.   Recommendations for follow up therapy are one component of a multi-disciplinary discharge planning process, led by the attending physician.  Recommendations may be updated based on patient status, additional functional criteria and insurance authorization.  Follow Up Recommendations  Acute inpatient rehab (3hours/day)     Assistance Recommended at Discharge Frequent or constant Supervision/Assistance  Patient can return home with the following Two people to help with walking and/or transfers;Two people to help with bathing/dressing/bathroom;Direct supervision/assist for medications management;Assistance with cooking/housework;Direct supervision/assist for financial management;Assist for transportation;Help with stairs or ramp for entrance   Equipment Recommendations  Hospital bed;Wheelchair (measurements PT);Wheelchair cushion (measurements PT);Other (comment)    Recommendations for Other Services       Precautions / Restrictions Precautions Precautions: Fall Precaution Comments: no R skull flap, abdominal flap     Mobility  Bed Mobility Overal bed mobility: Needs Assistance Bed Mobility: Supine to Sit, Sit to Supine     Supine to sit: HOB elevated, Max assist, +2 for  safety/equipment Sit to supine: Mod assist, +2 for physical assistance   General bed mobility comments: assist for L LE off bed and lifting trunk and scooting L hip; sit to supine assist for legs onto bed and repositioning trunk    Transfers Overall transfer level: Needs assistance Equipment used: 1 person hand held assist Transfers: Sit to/from Stand Sit to Stand: Max assist, Mod assist, +2 physical assistance           General transfer comment: stood x 4 with initial max A of 2 then able to reach bed rail with R UE and stood x 3 more trials with mod A of 2, but leaning over some to be able to reach rail; assist to block L LE but noted not buckling    Ambulation/Gait                   Stairs             Wheelchair Mobility    Modified Rankin (Stroke Patients Only) Modified Rankin (Stroke Patients Only) Pre-Morbid Rankin Score: No symptoms Modified Rankin: Severe disability     Balance Overall balance assessment: Needs assistance Sitting-balance support: Feet supported Sitting balance-Leahy Scale: Poor Sitting balance - Comments: mod A at least for sitting due to L lateral lean Postural control: Left lateral lean Standing balance support: Single extremity supported Standing balance-Leahy Scale: Zero Standing balance comment: +2 for standing; able to stand up to about 30 sec                            Cognition Arousal/Alertness: Awake/alert Behavior During Therapy:  (hyperverbal) Overall Cognitive Status: Impaired/Different from baseline Area of Impairment: Attention, Memory, Following commands, Safety/judgement, Problem solving  Current Attention Level: Sustained Memory: Decreased short-term memory Following Commands: Follows one step commands consistently, Follows one step commands with increased time Safety/Judgement: Decreased awareness of deficits   Problem Solving: Slow processing, Requires verbal  cues General Comments: L neglect R gaze preference; perseverating on gingerale        Exercises Other Exercises Other Exercises: L LE heel cord stretch and positioning more for hip neutral    General Comments General comments (skin integrity, edema, etc.): neice and significant other in the room and supportive; encouraged more L side awareness, able to locate L hand x 1 for brief moment at midline      Pertinent Vitals/Pain Pain Assessment Pain Assessment: Faces Faces Pain Scale: Hurts little more Pain Location: R abdomen with leaning Pain Descriptors / Indicators: Guarding, Sore Pain Intervention(s): Monitored during session    Home Living                          Prior Function            PT Goals (current goals can now be found in the care plan section) Progress towards PT goals: Progressing toward goals    Frequency    Min 4X/week      PT Plan Current plan remains appropriate;Frequency needs to be updated    Co-evaluation              AM-PAC PT "6 Clicks" Mobility   Outcome Measure  Help needed turning from your back to your side while in a flat bed without using bedrails?: A Lot Help needed moving from lying on your back to sitting on the side of a flat bed without using bedrails?: Total Help needed moving to and from a bed to a chair (including a wheelchair)?: Total Help needed standing up from a chair using your arms (e.g., wheelchair or bedside chair)?: Total Help needed to walk in hospital room?: Total Help needed climbing 3-5 steps with a railing? : Total 6 Click Score: 7    End of Session Equipment Utilized During Treatment: Gait belt Activity Tolerance: Patient tolerated treatment well Patient left: in bed;with call bell/phone within reach;with family/visitor present;with bed alarm set (in chair position)   PT Visit Diagnosis: Other abnormalities of gait and mobility (R26.89);Hemiplegia and hemiparesis Hemiplegia - Right/Left:  Left Hemiplegia - dominant/non-dominant: Non-dominant Hemiplegia - caused by: Cerebral infarction     Time: 1133-1202 PT Time Calculation (min) (ACUTE ONLY): 29 min  Charges:  $Therapeutic Activity: 23-37 mins                     Magda Kiel, PT Acute Rehabilitation Services Office:[phone] 10/19/2022    Reginia Naas 10/19/2022, 1:29 PM

## 2022-10-19 NOTE — Progress Notes (Signed)
Subjective: The patient is alert and pleasant.  She is in no apparent distress.  Objective: Vital signs in last 24 hours: Temp:  [98.5 F (36.9 C)-99.5 F (37.5 C)] 99.5 F (37.5 C) (01/16 0400) Pulse Rate:  [60-87] 80 (01/16 0700) Resp:  [14-23] 21 (01/16 0700) BP: (112-145)/(60-95) 141/86 (01/16 0700) SpO2:  [98 %-100 %] 100 % (01/16 0700) Estimated body mass index is 39.69 kg/m as calculated from the following:   Height as of this encounter: 5\' 2"  (1.575 m).   Weight as of this encounter: 98.4 kg.   Intake/Output from previous day: 01/15 0701 - 01/16 0700 In: 1504.6 [P.O.:240; I.V.:1017.7; IV Piggyback:246.9] Out: 1155 [Urine:1150; Drains:5] Intake/Output this shift: No intake/output data recorded.  Physical exam the patient is alert and oriented.  She is conversant.  She is left hemiplegic.  Lab Results: Recent Labs    10/18/22 0500 10/19/22 0614  WBC 13.3* 11.7*  HGB 11.7* 11.3*  HCT 35.4* 33.9*  PLT 184 180   BMET Recent Labs    10/18/22 0500 10/18/22 1037 10/19/22 0347 10/19/22 0614  NA 147*   < > 147* 147*  K 3.3*  --   --  3.4*  CL 116*  --   --  112*  CO2 25  --   --  24  GLUCOSE 120*  --   --  115*  BUN 10  --   --  11  CREATININE 0.73  --   --  0.62  CALCIUM 8.3*  --   --  8.7*   < > = values in this interval not displayed.    Studies/Results: DG Swallowing Func-Speech Pathology  Result Date: 10/18/2022 Table formatting from the original result was not included. Objective Swallowing Evaluation: Type of Study: MBS-Modified Barium Swallow Study  Patient Details Name: HINDA LINDOR MRN: [id number] Date of Birth: 1966-08-25 Today's Date: 10/18/2022 Time: SLP Start Time (ACUTE ONLY): 1445 -SLP Stop Time (ACUTE ONLY): 1500 SLP Time Calculation (min) (ACUTE ONLY): 15 min Past Medical History: Past Medical History: Diagnosis Date  Hypertension  Past Surgical History: Past Surgical History: Procedure Laterality Date  CRANIOTOMY Right 10/16/2022  Procedure:  RIGHT CRANIECTOMY;  Surgeon: Newman Pies, MD;  Location: [facility];  Service: Neurosurgery;  Laterality: Right; HPI: Pt is a 57 year old female who presented to the ED on 1/13 due to left-sided weakness and slurred speech. 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 Risk for brain herniation and neurological death due to malignant cerebral edema thought to be high; pt s/p emergency right hemicraniectomy 1/13. PMH: HTN, prediabetes  No data recorded  Recommendations for follow up therapy are one component of a multi-disciplinary discharge planning process, led by the attending physician.  Recommendations may be updated based on patient status, additional functional criteria and insurance authorization. Assessment / Plan / Recommendation   10/18/2022   3:04 PM Clinical Impressions Clinical Impression Visualization during the study was challenging due to pt's frequent superior movement of her right arm and her right clavicle intermittently obscuring the view of the larynx. Pt presents with oropharyngeal dysphagia characterized by reduced labial seal, weak lingual manipulation, impaired mastication, reduced bolus cohesion, reduced posterior bolus propulsion, and a pharyngeal delay. She demonstrated left-sided anterior spillage, difficulty with A-P transport of solids, a munching masticatory pattern, premature spillage to the valleculae and pyriform sinuses, and the swallow was often triggered with the head of liquid boluses in the hypopharynx. Penetration (PAS 3) and aspiration (PAS 7) were noted with thin liquids secondary to  the pharyngeal delay. Aspiration triggered throat clearing and a delayed cough. Laryngeal invasion was improved to PAS 5 with use of a chin tuck. Penetration and aspiration were eliminated with use of individual boluses, but pt exhibited significant difficulty consistently using this strategy. With prompts, she often reduced the number of consecutive swallows to three and aspiration of penetrated  material was then noted by the second/third consecutive swallow. A dysphagia 2 diet with nectar thick liquid is recommended at this time with observance of swallowing precautions. Prognosis for advancement is judged to be good with intervention; SLP will continue to follow pt. SLP Visit Diagnosis Dysphagia, oropharyngeal phase (R13.12) Impact on safety and function Mild aspiration risk     10/18/2022   3:04 PM Treatment Recommendations Treatment Recommendations Therapy as outlined in treatment plan below     10/18/2022   3:04 PM Prognosis Prognosis for Safe Diet Advancement Good Barriers to Reach Goals Cognitive deficits;Severity of deficits   10/18/2022   3:04 PM Diet Recommendations SLP Diet Recommendations Dysphagia 2 (Fine chop) solids;Nectar thick liquid Liquid Administration via Cup;Straw Medication Administration Whole meds with liquid Compensations Slow rate;Small sips/bites;Follow solids with liquid;Monitor for anterior loss Postural Changes Seated upright at 90 degrees     10/18/2022   3:04 PM Other Recommendations Other Recommendations Order thickener from pharmacy Follow Up Recommendations Acute inpatient rehab (3hours/day) Functional Status Assessment Patient has had a recent decline in their functional status and demonstrates the ability to make significant improvements in function in a reasonable and predictable amount of time.   10/18/2022   3:04 PM Frequency and Duration  Speech Therapy Frequency (ACUTE ONLY) min 2x/week Treatment Duration 2 weeks     10/18/2022   3:04 PM Oral Phase Oral Phase Impaired Oral - Nectar Cup Left anterior bolus loss;Weak lingual manipulation;Reduced posterior propulsion;Decreased bolus cohesion;Premature spillage Oral - Nectar Straw Left anterior bolus loss;Weak lingual manipulation;Reduced posterior propulsion;Decreased bolus cohesion;Premature spillage Oral - Thin Cup Left anterior bolus loss;Weak lingual manipulation;Reduced posterior propulsion;Decreased bolus  cohesion;Premature spillage Oral - Thin Straw Left anterior bolus loss;Weak lingual manipulation;Reduced posterior propulsion;Decreased bolus cohesion;Premature spillage Oral - Puree Left anterior bolus loss;Weak lingual manipulation;Reduced posterior propulsion;Decreased bolus cohesion;Premature spillage Oral - Mech Soft Left anterior bolus loss;Weak lingual manipulation;Reduced posterior propulsion;Decreased bolus cohesion;Premature spillage;Impaired mastication    10/18/2022   3:04 PM Pharyngeal Phase Pharyngeal Phase Impaired Pharyngeal- Nectar Cup Delayed swallow initiation-vallecula;Pharyngeal residue - pyriform Pharyngeal- Nectar Straw Delayed swallow initiation-vallecula;Pharyngeal residue - pyriform Pharyngeal- Thin Cup Delayed swallow initiation-vallecula;Pharyngeal residue - pyriform;Delayed swallow initiation-pyriform sinuses;Penetration/Aspiration before swallow;Penetration/Apiration after swallow Pharyngeal Material enters airway, remains ABOVE vocal cords and not ejected out;Material enters airway, CONTACTS cords and not ejected out;Material enters airway, passes BELOW cords and not ejected out despite cough attempt by patient Pharyngeal- Thin Straw Delayed swallow initiation-vallecula;Pharyngeal residue - pyriform;Delayed swallow initiation-pyriform sinuses;Penetration/Aspiration before swallow;Penetration/Apiration after swallow Pharyngeal Material enters airway, remains ABOVE vocal cords and not ejected out;Material enters airway, passes BELOW cords and not ejected out despite cough attempt by patient Pharyngeal- Puree Delayed swallow initiation-vallecula;Pharyngeal residue - pyriform Pharyngeal- Mechanical Soft Delayed swallow initiation-vallecula;Pharyngeal residue - pyriform    10/18/2022   3:04 PM Cervical Esophageal Phase  Cervical Esophageal Phase [facility] Shanika I. Hardin Negus, [place], [place] Office number [phone] Horton Marshall 10/18/2022, 4:05 PM                      ECHOCARDIOGRAM COMPLETE BUBBLE STUDY  Result Date: 10/18/2022    ECHOCARDIOGRAM REPORT   Patient  Name:   Sheri Beck Date of Exam: 10/18/2022 Medical Rec #:  [id number]     Height:       62.0 in Accession #:    [id number]    Weight:       217.0 lb Date of Birth:  03-May-1966      BSA:          1.979 m Patient Age:    56 years      BP:           121/76 mmHg Patient Gender: F             HR:           61 bpm. Exam Location:  Inpatient Procedure: 2D Echo, Cardiac Doppler, Color Doppler and Saline Contrast Bubble            Study Indications:    stroke  History:        Patient has no prior history of Echocardiogram examinations.                 Risk Factors:Hypertension.  Sonographer:    Mike Gip Referring Phys: [phone] Tobey Grim IMPRESSIONS  1. Left ventricular ejection fraction, by estimation, is 60 to 65%. The left ventricle has normal function. The left ventricle has no regional wall motion abnormalities. Left ventricular diastolic parameters were normal.  2. Right ventricular systolic function is normal. The right ventricular size is normal. There is normal pulmonary artery systolic pressure.  3. The mitral valve is abnormal. Mild mitral valve regurgitation. No evidence of mitral stenosis.  4. The aortic valve is tricuspid. There is mild calcification of the aortic valve. There is mild thickening of the aortic valve. Aortic valve regurgitation is not visualized. Aortic valve sclerosis is present, with no evidence of aortic valve stenosis.  5. The inferior vena cava is normal in size with greater than 50% respiratory variability, suggesting right atrial pressure of 3 mmHg.  6. Agitated saline contrast bubble study was negative, with no evidence of any interatrial shunt. FINDINGS  Left Ventricle: Left ventricular ejection fraction, by estimation, is 60 to 65%. The left ventricle has normal function. The left ventricle has no regional wall motion abnormalities. The left ventricular internal cavity size  was normal in size. There is  no left ventricular hypertrophy. Left ventricular diastolic parameters were normal. Right Ventricle: The right ventricular size is normal. No increase in right ventricular wall thickness. Right ventricular systolic function is normal. There is normal pulmonary artery systolic pressure. The tricuspid regurgitant velocity is 2.62 m/s, and  with an assumed right atrial pressure of 3 mmHg, the estimated right ventricular systolic pressure is 30.5 mmHg. Left Atrium: Left atrial size was normal in size. Right Atrium: Right atrial size was normal in size. Pericardium: There is no evidence of pericardial effusion. Mitral Valve: The mitral valve is abnormal. There is mild thickening of the mitral valve leaflet(s). Mild mitral valve regurgitation. No evidence of mitral valve stenosis. Tricuspid Valve: The tricuspid valve is normal in structure. Tricuspid valve regurgitation is mild . No evidence of tricuspid stenosis. Aortic Valve: The aortic valve is tricuspid. There is mild calcification of the aortic valve. There is mild thickening of the aortic valve. Aortic valve regurgitation is not visualized. Aortic valve sclerosis is present, with no evidence of aortic valve stenosis. Pulmonic Valve: The pulmonic valve was normal in structure. Pulmonic valve regurgitation is not visualized. No evidence of pulmonic stenosis. Aorta: The aortic root is normal in  size and structure. Venous: The inferior vena cava is normal in size with greater than 50% respiratory variability, suggesting right atrial pressure of 3 mmHg. IAS/Shunts: No atrial level shunt detected by color flow Doppler. Agitated saline contrast was given intravenously to evaluate for intracardiac shunting. Agitated saline contrast bubble study was negative, with no evidence of any interatrial shunt.  LEFT VENTRICLE PLAX 2D LVIDd:         4.70 cm     Diastology LVIDs:         2.80 cm     LV e' medial:    7.62 cm/s LV PW:         0.70 cm     LV  E/e' medial:  13.0 LV IVS:        1.00 cm     LV e' lateral:   15.20 cm/s LVOT diam:     1.80 cm     LV E/e' lateral: 6.5 LV SV:         61 LV SV Index:   31 LVOT Area:     2.54 cm  LV Volumes (MOD) LV vol d, MOD A2C: 97.0 ml LV vol d, MOD A4C: 97.3 ml LV vol s, MOD A2C: 35.2 ml LV vol s, MOD A4C: 38.8 ml LV SV MOD A2C:     61.8 ml LV SV MOD A4C:     97.3 ml LV SV MOD BP:      59.2 ml RIGHT VENTRICLE          IVC RV Basal diam:  3.70 cm  IVC diam: 1.60 cm TAPSE (M-mode): 2.2 cm LEFT ATRIUM             Index        RIGHT ATRIUM           Index LA diam:        3.20 cm 1.62 cm/m   RA Area:     12.40 cm LA Vol (A2C):   37.8 ml 19.10 ml/m  RA Volume:   24.70 ml  12.48 ml/m LA Vol (A4C):   36.3 ml 18.34 ml/m LA Biplane Vol: 38.0 ml 19.20 ml/m  AORTIC VALVE LVOT Vmax:   142.00 cm/s LVOT Vmean:  88.500 cm/s LVOT VTI:    0.238 m  AORTA Ao Root diam: 2.90 cm Ao Asc diam:  2.90 cm MITRAL VALVE                TRICUSPID VALVE MV Area (PHT): 5.27 cm     TR Peak grad:   27.5 mmHg MV Decel Time: 144 msec     TR Vmax:        262.00 cm/s MV E velocity: 99.10 cm/s MV A velocity: 111.00 cm/s  SHUNTS MV E/A ratio:  0.89         Systemic VTI:  0.24 m                             Systemic Diam: 1.80 cm Skanda Worlds Rouge MD Electronically signed by Ivyana Locey Rouge MD Signature Date/Time: 10/18/2022/9:59:08 AM    Final    [place] EKG SITE RITE  Result Date: 10/17/2022 If Site Rite image not attached, placement could not be confirmed due to current cardiac rhythm.  CT HEAD WO CONTRAST (5MM)  Result Date: 10/17/2022 CLINICAL DATA:  Neurologic change. EXAM: CT HEAD WITHOUT CONTRAST TECHNIQUE: Contiguous axial images were obtained from the base of the skull through  the vertex without intravenous contrast. RADIATION DOSE REDUCTION: This exam was performed according to the departmental dose-optimization program which includes automated exposure control, adjustment of the mA and/or kV according to patient size and/or use of iterative  reconstruction technique. COMPARISON:  CT head without contrast 10/17/2022 at 4:35 a.m. FINDINGS: Brain: Large right MCA territory infarct is again noted. Patient is status post craniectomy. Areas of petechial hemorrhage near the superior aspect of the infarct are again seen. No larger hemorrhagic transformation is evident. Mass effect is increasing with further effacement of the right lateral ventricle. Although there is no leftward midline shift, the midline was previously to the right of midline after the craniectomy. It is now at the midline consistent with increased mass effect. The basal cisterns are less patent than on the prior exam. Left hemisphere is unchanged. Progressive effacement of sulci and noted along the vermis. No downward herniation is present. CSF space anterior to the pons is preserved. Fourth ventricle is stable in size. Vascular: Hyperdense right MCA again noted. Skull: Right craniectomy. Similar appearance of edematous changes in the scalp overlying the craniectomy. Sinuses/Orbits: The paranasal sinuses and mastoid air cells are clear. Stable exophthalmos. No acute abnormality. IMPRESSION: 1. Evolving large right MCA territory infarct with petechial hemorrhage. No larger hemorrhagic transformation. 2. Increased mass effect with further effacement of the right lateral ventricle. Although there is no leftward midline shift, the midline was previously to the right of midline after the craniectomy. It is now at the midline consistent with increased mass effect. The basal cisterns are less patent than on the prior exam. The above was relayed via text pager to Dr. Janine Ores on 10/17/2022 at 14:33. Electronically Signed   By: San Morelle M.D.   On: 10/17/2022 14:33    Assessment/Plan: Postop day #3: The patient is doing well so far and as expected she remains left hemiplegic.  I will decrease her 3% sodium and likely discontinue it tomorrow.  We are awaiting rehab placement.  LOS: 3  days     Ophelia Charter 10/19/2022, 7:58 AM     Patient ID: Rushie Nyhan, female   DOB: 07-29-1966, 57 y.o.   MRN: [id number]

## 2022-10-19 NOTE — Progress Notes (Signed)
Inpatient Rehab Admissions Coordinator:    I spoke with Pt.'s daughter Sheri Beck, who is Pt.'s next of kin  over the phone. Sheri Beck feels that Sheri Beck (Pt.'s significant other who has stated he will be her primary caregiver)  is not able to care for her mother, that he has his own health issues, and that he is barely able to care for himself. She would like mother placed in SNF, even though she may not get therapy as consistently as she would on CIR.  I spoke with attending service NP, who consulted with Dr. Arnoldo Morale, that Pt. Does in fact have decision making capacity and then went to discuss CIR vs. SNF with the Pt.  I spoke with pt. (Without Significant Other present) and she states she also would prefer SNF and expressed reservations about Tony's ability to take care of her. I will notify TOC to request that they begin looking for SNF for Pt.   Clemens Catholic, [place], [place] Admissions Coordinator  [phone] ([place]) [phone] (office)

## 2022-10-19 NOTE — Progress Notes (Signed)
Pharmacy Electrolyte Replacement  Recent Labs:  Recent Labs    10/19/22 0614  K 3.4*  MG 2.2  PHOS 2.6  CREATININE 0.62    Low Critical Values (K </= 2.5, Phos </= 1, Mg </= 1) Present: None  MD Contacted: n/a - no critical values noted  Plan: KCl 32mEq PO x 1 dose Recheck in AM per protocol   Arturo Morton, PharmD, BCPS Please check AMION for all [place] contact numbers Clinical Pharmacist 10/19/2022 11:01 AM

## 2022-10-19 NOTE — Progress Notes (Signed)
   NAME:  Sheri Beck, MRN:  [id number], DOB:  05-Feb-1966, LOS: 3 ADMISSION DATE:  10/16/2022, CONSULTATION DATE:  1/13 REFERRING MD:  Dr. Randal Buba, CHIEF COMPLAINT:  R MCA   History of Present Illness:  Patient is a 57 year old female with pertinent PMH of HTN, prediabetes presents to [facility] ED on 1/13 code stroke.  Patient's LKN on 1/13 at 10.  Husband witnessed patient having left facial droop and slurred speech.  EMS called and witnessed patient with left-sided weakness and lethargy; unequal pupils.  Patient transported to [facility] as code stroke.  Upon arrival to [facility], patient's vital stable.  Neuro consulted.  Patient with left-sided deficits.  CT head showing large right MCA infarct. Not tnk candidate due to risk of hemorrhagic transformation.  High risk of developing significant edema. NSG consulted and planning to start patient on hypertonic saline. Labs pending. PCCM consulted for icu admission.  NSY took to OR for hemicrani. On arrival to ICU patient with loud snoring, eyelid apraxia, when awakens follows commands on right hemibody. Left with no motor movement. On 50 ml/hr 3%.   Pertinent  Medical History  HTN, pre-diabetes   Significant Hospital Events: Including procedures, antibiotic start and stop dates in addition to other pertinent events   1/13 large R mca stroke w/ cerebral edema; neuro and nsg following; hypertonic saline for edema; pccm to admit  Interim History / Subjective:  No overnight events Interactive No distress  Objective   Blood pressure (!) 133/115, pulse 88, temperature 99.5 F (37.5 C), temperature source Oral, resp. rate (!) 21, height 5\' 2"  (1.575 m), weight 98.4 kg, SpO2 100 %.        Intake/Output Summary (Last 24 hours) at 10/19/2022 0936 Last data filed at 10/19/2022 0800 Gross per 24 hour  Intake 1628.51 ml  Output 1155 ml  Net 473.51 ml   Filed Weights   10/16/22 0840  Weight: 98.4 kg    Examination: General: Middle-age, does not appear to be  in distress HEENT: Moist oral mucosa Neuro: Hemiplegic on the left CV: S1-S2 appreciated PULM: Clear breath sounds GI: Soft, bowel sounds appreciated Extremities: No significant abnormality Skin: Warm and dry  Resolved Hospital Problem list     Assessment & Plan:   Right MCA stroke -Concern for significant edema with mass effect and possible herniation -S/p craniotomy 1/13 -CTA 1/14 with large right MCA infarct and progressive cytotoxic edema but no leftward midline shift, no malignant hemorrhagic transformation -Mental status appears to be stable  UDS positive for cocaine on presentation -Continue frequent neurochecks -Continue neuroprotective measures including normothermia, euglycemia, head of the bed elevation  -Started on dysphagia diet  Hypernatremia in the setting of hypertonic saline infusion -Current sodium 147  Hypertension -Goal systolic blood pressure less than 160 -Home Norvasc, Lisinopril Hydrochlorothiazide on hold  Prediabetes with HbA1c of 6.0 -Continue CBG monitoring  Leukocytosis This is likely reactive -Continue to trend, trend fever curve  On dysphagia diet  PCCM will sign off  Sherrilyn Rist, MD [employer] PCCM Pager: See Shea Evans

## 2022-10-20 ENCOUNTER — Inpatient Hospital Stay (HOSPITAL_COMMUNITY): Payer: Medicaid Other

## 2022-10-20 ENCOUNTER — Other Ambulatory Visit: Payer: Self-pay

## 2022-10-20 DIAGNOSIS — I63411 Cerebral infarction due to embolism of right middle cerebral artery: Secondary | ICD-10-CM | POA: Diagnosis not present

## 2022-10-20 DIAGNOSIS — I639 Cerebral infarction, unspecified: Secondary | ICD-10-CM

## 2022-10-20 LAB — CBC
HCT: 33.2 % — ABNORMAL LOW (ref 36.0–46.0)
Hemoglobin: 10.8 g/dL — ABNORMAL LOW (ref 12.0–15.0)
MCH: 29.4 pg (ref 26.0–34.0)
MCHC: 32.5 g/dL (ref 30.0–36.0)
MCV: 90.5 fL (ref 80.0–100.0)
Platelets: 181 10*3/uL (ref 150–400)
RBC: 3.67 MIL/uL — ABNORMAL LOW (ref 3.87–5.11)
RDW: 13.5 % (ref 11.5–15.5)
WBC: 10.3 10*3/uL (ref 4.0–10.5)
nRBC: 0 % (ref 0.0–0.2)

## 2022-10-20 LAB — BASIC METABOLIC PANEL
Anion gap: 6 (ref 5–15)
BUN: 14 mg/dL (ref 6–20)
CO2: 24 mmol/L (ref 22–32)
Calcium: 8.4 mg/dL — ABNORMAL LOW (ref 8.9–10.3)
Chloride: 111 mmol/L (ref 98–111)
Creatinine, Ser: 0.66 mg/dL (ref 0.44–1.00)
GFR, Estimated: >60 mL/min (ref >60)
Glucose, Bld: 112 mg/dL — ABNORMAL HIGH (ref 70–99)
Potassium: 3.2 mmol/L — ABNORMAL LOW (ref 3.5–5.1)
Sodium: 141 mmol/L (ref 135–145)

## 2022-10-20 LAB — GLUCOSE, CAPILLARY
Glucose-Capillary: 107 mg/dL — ABNORMAL HIGH (ref 70–99)
Glucose-Capillary: 117 mg/dL — ABNORMAL HIGH (ref 70–99)
Glucose-Capillary: 120 mg/dL — ABNORMAL HIGH (ref 70–99)
Glucose-Capillary: 126 mg/dL — ABNORMAL HIGH (ref 70–99)
Glucose-Capillary: 137 mg/dL — ABNORMAL HIGH (ref 70–99)
Glucose-Capillary: 163 mg/dL — ABNORMAL HIGH (ref 70–99)

## 2022-10-20 LAB — SODIUM: Sodium: 147 mmol/L — ABNORMAL HIGH (ref 135–145)

## 2022-10-20 LAB — HEPARIN LEVEL (UNFRACTIONATED): Heparin Unfractionated: 0.55 IU/mL (ref 0.30–0.70)

## 2022-10-20 MED ORDER — HEPARIN (PORCINE) 25000 UT/250ML-% IV SOLN
700.0000 [IU]/h | INTRAVENOUS | Status: DC
  Administered 2022-10-20: 900 [IU]/h via INTRAVENOUS
  Filled 2022-10-20: qty 250

## 2022-10-20 MED ORDER — ASPIRIN 81 MG PO TBEC
81.0000 mg | DELAYED_RELEASE_TABLET | Freq: Every day | ORAL | Status: DC
  Filled 2022-10-20: qty 1

## 2022-10-20 MED ORDER — POLYETHYLENE GLYCOL 3350 17 G PO PACK
17.0000 g | PACK | Freq: Every day | ORAL | Status: DC
  Administered 2022-10-20 – 2022-10-23 (×4): 17 g via ORAL
  Filled 2022-10-20 (×4): qty 1

## 2022-10-20 MED ORDER — DOCUSATE SODIUM 100 MG PO CAPS
100.0000 mg | ORAL_CAPSULE | Freq: Two times a day (BID) | ORAL | Status: DC
  Administered 2022-10-20 – 2022-10-23 (×7): 100 mg via ORAL
  Filled 2022-10-20 (×7): qty 1

## 2022-10-20 MED ORDER — POLYETHYLENE GLYCOL 3350 17 G PO PACK: 17.0000 g | PACK | Freq: Every day | ORAL | Status: DC | PRN

## 2022-10-20 NOTE — TOC Initial Note (Signed)
Transition of Care [facility]) - Initial/Assessment Note    Patient Details  Name: Sheri Beck MRN: [id number] Date of Birth: Jul 15, 1966  Transition of Care [facility]) CM/SW Contact:    Benard Halsted, LCSW Phone Number: 10/20/2022, 12:38 PM  Clinical Narrative:                 CSW received consult that patient and family are in favor of SNF over CIR. CSW contacted patient's insurance (Ref# [id number]) and Joelene Millin stated the patient has 90 days of SNF benefit with no copay and requires prior authorization. CSW spoke with patient and her daughter and niece at bedside and presented bed offers. They requested preference for [facility] as it is close by to family. [facility] stated they can accept patient and will begin insurance authorization process.   Expected Discharge Plan: Skilled Nursing Facility Barriers to Discharge: Insurance Authorization, Continued Medical Work up   Patient Goals and CMS Choice Patient states their goals for this hospitalization and ongoing recovery are:: Rehab CMS Medicare.gov Compare Post Acute Care list provided to:: Patient Represenative (must comment) Choice offered to / list presented to : Adult Children (Niece) [place] ownership interest in [facility].provided to:: Adult Children    Expected Discharge Plan and Services In-house Referral: Clinical Social Work   Post Acute Care Choice: [place] Living arrangements for the past 2 months: Apartment                                      Prior Living Arrangements/Services Living arrangements for the past 2 months: Apartment Lives with:: Significant Other Patient language and need for interpreter reviewed:: Yes Do you feel safe going back to the place where you live?: Yes      Need for Family Participation in Patient Care: Yes (Comment) Care giver support system in place?: Yes (comment)   Criminal Activity/Legal Involvement Pertinent to Current Situation/Hospitalization: No -  Comment as needed  Activities of Daily Living      Permission Sought/Granted Permission sought to share information with : Facility Sport and exercise psychologist, Family Supports Permission granted to share information with : Yes, Verbal Permission Granted  Share Information with NAME: Shameka/Chanel  Permission granted to share info w AGENCY: SNFs  Permission granted to share info w Relationship: Daughter/Niece  Permission granted to share info w Contact Information: [phone]  Emotional Assessment Appearance:: Appears stated age Attitude/Demeanor/Rapport: Engaged Affect (typically observed): Accepting, Appropriate (sometimes off topic) Orientation: : Oriented to Self, Oriented to Place, Oriented to  Time, Oriented to Situation Alcohol / Substance Use: Not Applicable Psych Involvement: No (comment)  Admission diagnosis:  Stroke [facility]) [I63.9] Cerebrovascular accident (CVA), unspecified mechanism ([place]) [I63.9] Stroke (cerebrum) [facility]) [I63.9] Patient Active Problem List   Diagnosis Date Noted   Stroke ([place]) 10/16/2022   Stroke (cerebrum) ([place]) 10/16/2022   Hyperlipidemia 09/28/2022   Severe obesity (BMI 35.0-39.9) with comorbidity ([place]) 09/28/2022   Acute bacterial conjunctivitis of right eye 05/06/2020   Dental abscess 05/06/2020   Essential hypertension 05/06/2020   PCP:  Farrel Conners, MD Pharmacy:   [facility] [place] [address] [place]) [place] [address] Phone: [phone] Fax: [phone]     Social Determinants of Health (SDOH) Social History: SDOH Screenings   Depression (PHQ2-9): Low Risk  (04/08/2022)  Tobacco Use: High Risk (10/17/2022)   SDOH Interventions:  Readmission Risk Interventions     No data to display

## 2022-10-20 NOTE — Progress Notes (Addendum)
[facility] [place] for heparin Indication:  acute DVT, stroke protocol  Heparin Dosing Weight: 73.4 kg  Labs: Recent Labs    10/18/22 0500 10/19/22 0614 10/20/22 0456  HGB 11.7* 11.3* 10.8*  HCT 35.4* 33.9* 33.2*  PLT 184 180 181  CREATININE 0.73 0.62 0.66    Assessment: 19 yof presenting 1/13 with R MCA stroke, not given TNK with patient deemed high risk for hemorrhagic conversion with concern for significant edema with mass effect and possible herniation, s/p craniotomy 1/13. Repeat imaging with large R MCA infarct and progressive cytoxic edema but no MLS or malignant hemorrhagic transformation. LE dopplers positive for acute DVT in LLE. Pharmacy consulted to start heparin infusion per stroke protocol.  Currently on heparin 5000 units [place] q8h for VTE prophylaxis, last dose at 0511 this morning. Hg down a bit to 10.8, plt WNL. No active bleed issues reported. Patient is not on anticoagulation PTA.  Goal of Therapy:  Heparin level 0.3-0.5 units/ml Monitor platelets by anticoagulation protocol: Yes   Plan:  D/c heparin SQ No bolus per stroke protocol. Start heparin at 900 units/hr Check 6hr heparin level Monitor daily CBC, s/sx bleeding   Arturo Morton, PharmD, BCPS Clinical Pharmacist 10/20/2022 10:12 AM

## 2022-10-20 NOTE — Progress Notes (Signed)
Pt transferred from 4N29 to 3W20. Pt's s/o at bedside and agrees to communicate transfer to the rest of the family. All belongings packed and taken with pt.   Justice Rocher, RN

## 2022-10-20 NOTE — Progress Notes (Signed)
Physical Therapy Treatment Patient Details Name: Sheri Beck MRN: [id number] DOB: 01-21-1966 Today's Date: 10/20/2022   History of Present Illness 57 yo female presents to [facility] on 1/13 with L facial droop, slurred speech, L weakness, UDS + cocaine. CTH shows large R MCA CVA. S/p R frontal temporal parietal decompressive craniectomy, insertion of craniotomy flap in abdomen on 1/13. PMHx: HTN, HLD, substance use    PT Comments    Pt making slower progress toward goals, still with strong gaze preference toward the right, neglect of L side, short attention overall/distractibility.  Emphasis on transition and scoot to EOB, upright sitting/symmetrical sitting and sit to stand x2 with ~ 1 minute in stance for each trial.    Recommendations for follow up therapy are one component of a multi-disciplinary discharge planning process, led by the attending physician.  Recommendations may be updated based on patient status, additional functional criteria and insurance authorization.  Follow Up Recommendations  Acute inpatient rehab (3hours/day)     Assistance Recommended at Discharge Frequent or constant Supervision/Assistance  Patient can return home with the following Two people to help with walking and/or transfers;Two people to help with bathing/dressing/bathroom;Direct supervision/assist for medications management;Assistance with cooking/housework;Direct supervision/assist for financial management;Assist for transportation;Help with stairs or ramp for entrance   Equipment Recommendations  Hospital bed;Wheelchair (measurements PT);Wheelchair cushion (measurements PT);Other (comment)    Recommendations for Other Services Rehab consult     Precautions / Restrictions Precautions Precautions: Fall Precaution Comments: no R skull flap, abdominal flap Restrictions Weight Bearing Restrictions: No     Mobility  Bed Mobility Overal bed mobility: Needs Assistance Bed Mobility: Supine to Sit, Sit to  Supine     Supine to sit: Max assist, +2 for physical assistance, +2 for safety/equipment Sit to supine: Max assist, +2 for physical assistance, +2 for safety/equipment        Transfers Overall transfer level: Needs assistance Equipment used: 2 person hand held assist Transfers: Sit to/from Stand Sit to Stand: Max assist, +2 physical assistance, +2 safety/equipment           General transfer comment: stood 2x for ~1 minute each time    Ambulation/Gait                   Stairs             Wheelchair Mobility    Modified Rankin (Stroke Patients Only) Modified Rankin (Stroke Patients Only) Pre-Morbid Rankin Score: No symptoms Modified Rankin: Severe disability     Balance Overall balance assessment: Needs assistance Sitting-balance support: Feet supported Sitting balance-Leahy Scale: Poor Sitting balance - Comments: mod A at least for sitting due to L lateral lean. Work at [employer] >10 min on finding and staying at [employer] upright sitting. Postural control: Left lateral lean Standing balance support: Single extremity supported Standing balance-Leahy Scale: Zero                              Cognition Arousal/Alertness: Awake/alert Behavior During Therapy: Restless Overall Cognitive Status: Impaired/Different from baseline Area of Impairment: Attention, Memory, Following commands, Safety/judgement, Problem solving                   Current Attention Level: Sustained Memory: Decreased short-term memory Following Commands: Follows one step commands consistently, Follows one step commands with increased time Safety/Judgement: Decreased awareness of deficits   Problem Solving: Slow processing, Requires verbal cues General Comments: poor higher level functioning but  following all commands, talking and dual tasking. verbalizing understanding of deficits and diet precautions        Exercises Other Exercises Other Exercises: R lateral  lean onto elbow and push up x5    General Comments General comments (skin integrity, edema, etc.): family/friends present and supportive, encouraged family to encourage midline gaze and low stim environment to allow for rest and recovery      Pertinent Vitals/Pain Pain Assessment Pain Assessment: Faces Faces Pain Scale: Hurts a little bit Pain Location: my head Pain Descriptors / Indicators: Guarding, Sore Pain Intervention(s): Limited activity within patient's tolerance    Home Living Family/patient expects to be discharged to:: Private residence                        Prior Function            PT Goals (current goals can now be found in the care plan section) Acute Rehab PT Goals PT Goal Formulation: With patient Time For Goal Achievement: 10/31/22 Potential to Achieve Goals: Fair Progress towards PT goals: Progressing toward goals    Frequency    Min 4X/week      PT Plan Current plan remains appropriate;Frequency needs to be updated    Co-evaluation PT/OT/SLP Co-Evaluation/Treatment: Yes Reason for Co-Treatment: Complexity of the patient's impairments (multi-system involvement) PT goals addressed during session: Mobility/safety with mobility OT goals addressed during session: ADL's and self-care      AM-PAC PT "6 Clicks" Mobility   Outcome Measure  Help needed turning from your back to your side while in a flat bed without using bedrails?: A Lot Help needed moving from lying on your back to sitting on the side of a flat bed without using bedrails?: Total Help needed moving to and from a bed to a chair (including a wheelchair)?: Total Help needed standing up from a chair using your arms (e.g., wheelchair or bedside chair)?: Total Help needed to walk in hospital room?: Total Help needed climbing 3-5 steps with a railing? : Total 6 Click Score: 7    End of Session Equipment Utilized During Treatment: Gait belt Activity Tolerance: Patient tolerated  treatment well Patient left: in bed;with call bell/phone within reach;with family/visitor present;with bed alarm set Nurse Communication: Mobility status PT Visit Diagnosis: Other abnormalities of gait and mobility (R26.89);Hemiplegia and hemiparesis Hemiplegia - Right/Left: Left Hemiplegia - dominant/non-dominant: Non-dominant Hemiplegia - caused by: Cerebral infarction     Time: 1120-1156 PT Time Calculation (min) (ACUTE ONLY): 36 min  Charges:  $Neuromuscular Re-education: 8-22 mins                     10/20/2022  Ginger Carne., PT Acute Rehabilitation Services [phone]  (office)   Tessie Fass Merl Bommarito 10/20/2022, 1:47 PM

## 2022-10-20 NOTE — Progress Notes (Signed)
Inpatient Rehab Admissions Coordinator:    I spoke with Pt. And confirmed that the current plan is for SNF placement once medically stable for d/c. Per TOC, may be difficult to place due to substance abuse and payor source. I will sign off for now, but asked that TOC reconsult CIR if  Pt. Is unable to be placed in SNF and Pt. And daughter are OK with her discharging home with significant other, Nicole [facility]. (Daughter expressed concerns about his ability to care for Pt, but he states that he is willing and able to if needed).   Clemens Catholic, [place], [place] Admissions Coordinator  [phone] ([place]) [phone] (office)

## 2022-10-20 NOTE — Plan of Care (Signed)
  Problem: Education: Goal: Knowledge of disease or condition will improve Outcome: Progressing   Problem: Coping: Goal: Will identify appropriate support needs Outcome: Progressing   Problem: Health Behavior/Discharge Planning: Goal: Ability to manage health-related needs will improve Outcome: Progressing   Problem: Self-Care: Goal: Ability to participate in self-care as condition permits will improve Outcome: Progressing   Problem: Nutrition: Goal: Risk of aspiration will decrease Outcome: Progressing Goal: Dietary intake will improve Outcome: Progressing

## 2022-10-20 NOTE — Progress Notes (Addendum)
Subjective: The patient is alert and pleasant.  She is using her phone.  Objective: Vital signs in last 24 hours: Temp:  [98.6 F (37 C)-99.7 F (37.6 C)] 98.8 F (37.1 C) (01/17 0800) Pulse Rate:  [58-82] 69 (01/17 0800) Resp:  [15-24] 19 (01/17 0800) BP: (123-157)/(68-96) 157/95 (01/17 0800) SpO2:  [92 %-100 %] 92 % (01/17 0800) Weight:  [97.6 kg] 97.6 kg (01/17 0515) Estimated body mass index is 39.35 kg/m as calculated from the following:   Height as of this encounter: 5\' 2"  (1.575 m).   Weight as of this encounter: 97.6 kg.   Intake/Output from previous day: 01/16 0701 - 01/17 0700 In: 921.8 [P.O.:240; I.V.:681.8] Out: 1375 [Urine:1375] Intake/Output this shift: Total I/O In: 25 [I.V.:25] Out: -   Physical exam the patient is alert and oriented x 3.  She has left hemiplegia.  Her craniectomy incision is healing well.  Lab Results: Recent Labs    10/19/22 0614 10/20/22 0456  WBC 11.7* 10.3  HGB 11.3* 10.8*  HCT 33.9* 33.2*  PLT 180 181   BMET Recent Labs    10/19/22 0614 10/19/22 0834 10/20/22 0456 10/20/22 0810  NA 147*   < > 141 147*  K 3.4*  --  3.2*  --   CL 112*  --  111  --   CO2 24  --  24  --   GLUCOSE 115*  --  112*  --   BUN 11  --  14  --   CREATININE 0.62  --  0.66  --   CALCIUM 8.7*  --  8.4*  --    < > = values in this interval not displayed.    Studies/Results: VAS US LOWER EXTREMITY VENOUS (DVT)  Result Date: 10/20/2022  Lower Venous DVT Study Patient Name:  Sheri Beck  Date of Exam:   10/19/2022 Medical Rec #: [id number]      Accession #:    [id number] Date of Birth: 59/15/1967       Patient Gender: F Patient Age:   89 years [facility] Procedure:      VAS US LOWER EXTREMITY VENOUS (DVT) Referring Phys: Dewitt HoesORTNEY DE LA TORRE --------------------------------------------------------------------------------  Indications: Stroke.  Comparison Study: No prior studies. Performing Technologist: Jean Rosenthalachel Hodge RDMS, RVT   Examination Guidelines: A complete evaluation includes B-mode imaging, spectral Doppler, color Doppler, and power Doppler as needed of all accessible portions of each vessel. Bilateral testing is considered an integral part of a complete examination. Limited examinations for reoccurring indications may be performed as noted. The reflux portion of the exam is performed with the patient in reverse Trendelenburg.  +---------+---------------+---------+-----------+----------+--------------+ RIGHT    CompressibilityPhasicitySpontaneityPropertiesThrombus Aging +---------+---------------+---------+-----------+----------+--------------+ CFV      Full           Yes      Yes                                 +---------+---------------+---------+-----------+----------+--------------+ SFJ      Full                                                        +---------+---------------+---------+-----------+----------+--------------+ FV Prox  Full                                                        +---------+---------------+---------+-----------+----------+--------------+  FV Mid   Full                                                        +---------+---------------+---------+-----------+----------+--------------+ FV DistalFull                                                        +---------+---------------+---------+-----------+----------+--------------+ PFV      Full                                                        +---------+---------------+---------+-----------+----------+--------------+ POP      Full           Yes      Yes                                 +---------+---------------+---------+-----------+----------+--------------+ PTV      Full                                                        +---------+---------------+---------+-----------+----------+--------------+ PERO     Full                                                         +---------+---------------+---------+-----------+----------+--------------+   +---------+---------------+---------+-----------+----------+--------------+ LEFT     CompressibilityPhasicitySpontaneityPropertiesThrombus Aging +---------+---------------+---------+-----------+----------+--------------+ CFV      Full           Yes      Yes                                 +---------+---------------+---------+-----------+----------+--------------+ SFJ      Full                                                        +---------+---------------+---------+-----------+----------+--------------+ FV Prox  Full                                                        +---------+---------------+---------+-----------+----------+--------------+ FV Mid   Full                                                        +---------+---------------+---------+-----------+----------+--------------+  FV DistalFull                                                        +---------+---------------+---------+-----------+----------+--------------+ PFV      Full                                                        +---------+---------------+---------+-----------+----------+--------------+ POP      Full           Yes      Yes                                 +---------+---------------+---------+-----------+----------+--------------+ PTV      Partial        Yes      Yes                  Acute          +---------+---------------+---------+-----------+----------+--------------+ PERO     Partial        Yes      Yes                  Acute          +---------+---------------+---------+-----------+----------+--------------+ Gastroc  None           No       No                   Acute          +---------+---------------+---------+-----------+----------+--------------+     Summary: RIGHT: - There is no evidence of deep vein thrombosis in the lower extremity.  - No cystic structure found in  the popliteal fossa.  LEFT: - Findings consistent with acute deep vein thrombosis involving the left gastrocnemius veins, left posterior tibial veins, and left peroneal veins. - No cystic structure found in the popliteal fossa.  *See table(s) above for measurements and observations. Electronically signed by Waverly Ferrari Beck on 10/20/2022 at 6:56:36 AM.    Final    DG Swallowing Func-Speech Pathology  Result Date: 10/18/2022 Table formatting from the original result was not included. Objective Swallowing Evaluation: Type of Study: MBS-Modified Barium Swallow Study  Patient Details Name: Sheri Beck MRN: [id number] Date of Birth: Jul 08, 1966 Today's Date: 10/18/2022 Time: SLP Start Time (ACUTE ONLY): 1445 -SLP Stop Time (ACUTE ONLY): 1500 SLP Time Calculation (min) (ACUTE ONLY): 15 min Past Medical History: Past Medical History: Diagnosis Date  Hypertension  Past Surgical History: Past Surgical History: Procedure Laterality Date  CRANIOTOMY Right 10/16/2022  Procedure: RIGHT CRANIECTOMY;  Surgeon: Tressie Stalker, Beck;  Location: [facility] OR;  Service: Neurosurgery;  Laterality: Right; HPI: Pt is a 57 year old female who presented to the ED on 1/13 due to left-sided weakness and slurred speech. 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 Risk for brain herniation and neurological death due to malignant cerebral edema thought to be high; pt s/p emergency right hemicraniectomy 1/13. PMH: HTN, prediabetes  No data recorded  Recommendations for follow up therapy are one component of a multi-disciplinary discharge planning process, led by the attending physician.  Recommendations may be  updated based on patient status, additional functional criteria and insurance authorization. Assessment / Plan / Recommendation   10/18/2022   3:04 PM Clinical Impressions Clinical Impression Visualization during the study was challenging due to pt's frequent superior movement of her right arm and her right clavicle intermittently obscuring the  view of the larynx. Pt presents with oropharyngeal dysphagia characterized by reduced labial seal, weak lingual manipulation, impaired mastication, reduced bolus cohesion, reduced posterior bolus propulsion, and a pharyngeal delay. She demonstrated left-sided anterior spillage, difficulty with A-P transport of solids, a munching masticatory pattern, premature spillage to the valleculae and pyriform sinuses, and the swallow was often triggered with the head of liquid boluses in the hypopharynx. Penetration (PAS 3) and aspiration (PAS 7) were noted with thin liquids secondary to the pharyngeal delay. Aspiration triggered throat clearing and a delayed cough. Laryngeal invasion was improved to PAS 5 with use of a chin tuck. Penetration and aspiration were eliminated with use of individual boluses, but pt exhibited significant difficulty consistently using this strategy. With prompts, she often reduced the number of consecutive swallows to three and aspiration of penetrated material was then noted by the second/third consecutive swallow. A dysphagia 2 diet with nectar thick liquid is recommended at this time with observance of swallowing precautions. Prognosis for advancement is judged to be good with intervention; SLP will continue to follow pt. SLP Visit Diagnosis Dysphagia, oropharyngeal phase (R13.12) Impact on safety and function Mild aspiration risk     10/18/2022   3:04 PM Treatment Recommendations Treatment Recommendations Therapy as outlined in treatment plan below     10/18/2022   3:04 PM Prognosis Prognosis for Safe Diet Advancement Good Barriers to Reach Goals Cognitive deficits;Severity of deficits   10/18/2022   3:04 PM Diet Recommendations SLP Diet Recommendations Dysphagia 2 (Fine chop) solids;Nectar thick liquid Liquid Administration via Cup;Straw Medication Administration Whole meds with liquid Compensations Slow rate;Small sips/bites;Follow solids with liquid;Monitor for anterior loss Postural Changes  Seated upright at 90 degrees     10/18/2022   3:04 PM Other Recommendations Other Recommendations Order thickener from pharmacy Follow Up Recommendations Acute inpatient rehab (3hours/day) Functional Status Assessment Patient has had a recent decline in their functional status and demonstrates the ability to make significant improvements in function in a reasonable and predictable amount of time.   10/18/2022   3:04 PM Frequency and Duration  Speech Therapy Frequency (ACUTE ONLY) min 2x/week Treatment Duration 2 weeks     10/18/2022   3:04 PM Oral Phase Oral Phase Impaired Oral - Nectar Cup Left anterior bolus loss;Weak lingual manipulation;Reduced posterior propulsion;Decreased bolus cohesion;Premature spillage Oral - Nectar Straw Left anterior bolus loss;Weak lingual manipulation;Reduced posterior propulsion;Decreased bolus cohesion;Premature spillage Oral - Thin Cup Left anterior bolus loss;Weak lingual manipulation;Reduced posterior propulsion;Decreased bolus cohesion;Premature spillage Oral - Thin Straw Left anterior bolus loss;Weak lingual manipulation;Reduced posterior propulsion;Decreased bolus cohesion;Premature spillage Oral - Puree Left anterior bolus loss;Weak lingual manipulation;Reduced posterior propulsion;Decreased bolus cohesion;Premature spillage Oral - Mech Soft Left anterior bolus loss;Weak lingual manipulation;Reduced posterior propulsion;Decreased bolus cohesion;Premature spillage;Impaired mastication    10/18/2022   3:04 PM Pharyngeal Phase Pharyngeal Phase Impaired Pharyngeal- Nectar Cup Delayed swallow initiation-vallecula;Pharyngeal residue - pyriform Pharyngeal- Nectar Straw Delayed swallow initiation-vallecula;Pharyngeal residue - pyriform Pharyngeal- Thin Cup Delayed swallow initiation-vallecula;Pharyngeal residue - pyriform;Delayed swallow initiation-pyriform sinuses;Penetration/Aspiration before swallow;Penetration/Apiration after swallow Pharyngeal Material enters airway, remains ABOVE  vocal cords and not ejected out;Material enters airway, CONTACTS cords and not ejected out;Material enters airway, passes BELOW cords and not ejected out despite  cough attempt by patient Pharyngeal- Thin Straw Delayed swallow initiation-vallecula;Pharyngeal residue - pyriform;Delayed swallow initiation-pyriform sinuses;Penetration/Aspiration before swallow;Penetration/Apiration after swallow Pharyngeal Material enters airway, remains ABOVE vocal cords and not ejected out;Material enters airway, passes BELOW cords and not ejected out despite cough attempt by patient Pharyngeal- Puree Delayed swallow initiation-vallecula;Pharyngeal residue - pyriform Pharyngeal- Mechanical Soft Delayed swallow initiation-vallecula;Pharyngeal residue - pyriform    10/18/2022   3:04 PM Cervical Esophageal Phase  Cervical Esophageal Phase Sheri Beck, Sheri Beck, Sheri Beck Office number [phone] Horton Marshall 10/18/2022, 4:05 PM                     ECHOCARDIOGRAM COMPLETE BUBBLE STUDY  Result Date: 10/18/2022    ECHOCARDIOGRAM REPORT   Patient Name:   Sheri Beck Date of Exam: 10/18/2022 Medical Rec #:  [id number]     Height:       62.0 in Accession #:    [id number]    Weight:       217.0 lb Date of Birth:  1965-10-08      BSA:          1.979 m Patient Age:    6 years      BP:           121/76 mmHg Patient Gender: F             HR:           61 bpm. Exam Location:  Inpatient Procedure: 2D Echo, Cardiac Doppler, Color Doppler and Saline Contrast Bubble            Study Indications:    stroke  History:        Patient has no prior history of Echocardiogram examinations.                 Risk Factors:Hypertension.  Sonographer:    Phineas Douglas Referring Phys: [phone] [place]  1. Left ventricular ejection fraction, by estimation, is 60 to 65%. The left ventricle has normal function. The left ventricle has no regional wall motion abnormalities. Left ventricular diastolic  parameters were normal.  2. Right ventricular systolic function is normal. The right ventricular size is normal. There is normal pulmonary artery systolic pressure.  3. The mitral valve is abnormal. Mild mitral valve regurgitation. No evidence of mitral stenosis.  4. The aortic valve is tricuspid. There is mild calcification of the aortic valve. There is mild thickening of the aortic valve. Aortic valve regurgitation is not visualized. Aortic valve sclerosis is present, with no evidence of aortic valve stenosis.  5. The inferior vena cava is normal in size with greater than 50% respiratory variability, suggesting right atrial pressure of 3 mmHg.  6. Agitated saline contrast bubble study was negative, with no evidence of any interatrial shunt. FINDINGS  Left Ventricle: Left ventricular ejection fraction, by estimation, is 60 to 65%. The left ventricle has normal function. The left ventricle has no regional wall motion abnormalities. The left ventricular internal cavity size was normal in size. There is  no left ventricular hypertrophy. Left ventricular diastolic parameters were normal. Right Ventricle: The right ventricular size is normal. No increase in right ventricular wall thickness. Right ventricular systolic function is normal. There is normal pulmonary artery systolic pressure. The tricuspid regurgitant velocity is 2.62 m/s, and  with an assumed right atrial pressure of 3 mmHg, the estimated right ventricular systolic pressure is 26.8 mmHg. Left Atrium: Left atrial size was normal in  size. Right Atrium: Right atrial size was normal in size. Pericardium: There is no evidence of pericardial effusion. Mitral Valve: The mitral valve is abnormal. There is mild thickening of the mitral valve leaflet(s). Mild mitral valve regurgitation. No evidence of mitral valve stenosis. Tricuspid Valve: The tricuspid valve is normal in structure. Tricuspid valve regurgitation is mild . No evidence of tricuspid stenosis. Aortic  Valve: The aortic valve is tricuspid. There is mild calcification of the aortic valve. There is mild thickening of the aortic valve. Aortic valve regurgitation is not visualized. Aortic valve sclerosis is present, with no evidence of aortic valve stenosis. Pulmonic Valve: The pulmonic valve was normal in structure. Pulmonic valve regurgitation is not visualized. No evidence of pulmonic stenosis. Aorta: The aortic root is normal in size and structure. Venous: The inferior vena cava is normal in size with greater than 50% respiratory variability, suggesting right atrial pressure of 3 mmHg. IAS/Shunts: No atrial level shunt detected by color flow Doppler. Agitated saline contrast was given intravenously to evaluate for intracardiac shunting. Agitated saline contrast bubble study was negative, with no evidence of any interatrial shunt.  LEFT VENTRICLE PLAX 2D LVIDd:         4.70 cm     Diastology LVIDs:         2.80 cm     LV e' medial:    7.62 cm/s LV PW:         0.70 cm     LV E/e' medial:  13.0 LV IVS:        1.00 cm     LV e' lateral:   15.20 cm/s LVOT diam:     1.80 cm     LV E/e' lateral: 6.5 LV SV:         61 LV SV Index:   31 LVOT Area:     2.54 cm  LV Volumes (MOD) LV vol d, MOD A2C: 97.0 ml LV vol d, MOD A4C: 97.3 ml LV vol s, MOD A2C: 35.2 ml LV vol s, MOD A4C: 38.8 ml LV SV MOD A2C:     61.8 ml LV SV MOD A4C:     97.3 ml LV SV MOD BP:      59.2 ml RIGHT VENTRICLE          IVC RV Basal diam:  3.70 cm  IVC diam: 1.60 cm TAPSE (M-mode): 2.2 cm LEFT ATRIUM             Index        RIGHT ATRIUM           Index LA diam:        3.20 cm 1.62 cm/m   RA Area:     12.40 cm LA Vol (A2C):   37.8 ml 19.10 ml/m  RA Volume:   24.70 ml  12.48 ml/m LA Vol (A4C):   36.3 ml 18.34 ml/m LA Biplane Vol: 38.0 ml 19.20 ml/m  AORTIC VALVE LVOT Vmax:   142.00 cm/s LVOT Vmean:  88.500 cm/s LVOT VTI:    0.238 m  AORTA Ao Root diam: 2.90 cm Ao Asc diam:  2.90 cm MITRAL VALVE                TRICUSPID VALVE MV Area (PHT): 5.27 cm      TR Peak grad:   27.5 mmHg MV Decel Time: 144 msec     TR Vmax:        262.00 cm/s MV E velocity: 99.10 cm/s MV  A velocity: 111.00 cm/s  SHUNTS MV E/A ratio:  0.89         Systemic VTI:  0.24 m                             Systemic Diam: 1.80 cm Sheri Beck Electronically signed by Lilinoe A[place] Rouge Beck Signature Date/Time: 10/18/2022/9:59:08 AM    Final     Assessment/Plan: Postop day #4: The patient is doing well.  I will discontinue her 3% sodium.  I will transfer her to the progressive unit.  We are awaiting rehab placement.  Lower extremity DVT: The patient is on heparin.  LOS: 4 days     Ophelia Charter 10/20/2022, 9:15 AM     Patient ID: Sheri Beck, female   DOB: March 23, 1966, 57 y.o.   MRN: [id number]

## 2022-10-20 NOTE — Progress Notes (Addendum)
STROKE TEAM PROGRESS NOTE   INTERVAL HISTORY Patient remains alert and interactive today.   She continues to have right gaze deviation and unable to look to the left past midline.  There is persistent dense left hemiplegia and partial left hemineglect.  Blood pressure is adequately controlled.  Hypertonic saline drip was discontinued this morning.  Head without this morning to my reading appears essentially unchanged but has been read as showing slight increase in mass effect of 2 mm leftward midline shift and slight enlargement of small foci of hemorrhage.  Patient has been found to have DVT involving left popliteal, peroneal and gastrocnemius veins in the left leg.  Discussed with critical care Dr.Olalere who recommends anticoagulation with IV heparin Blood pressure adequately controlled.  Vital signs stable.  Vitals:   10/20/22 1100 10/20/22 1200 10/20/22 1240 10/20/22 1300  BP: (!) 141/86  133/83 (!) 127/99  Pulse: 71  67 60  Resp: 20  (!) 23 19  Temp:  98.8 F (37.1 C)    TempSrc:  Axillary    SpO2: 99%  99% 99%  Weight:      Height:       CBC:  Recent Labs  Lab 10/16/22 0412 10/16/22 0413 10/19/22 0614 10/20/22 0456  WBC 11.3*   < > 11.7* 10.3  NEUTROABS 9.1*  --   --   --   HGB 17.1*   < > 11.3* 10.8*  HCT 51.3*   < > 33.9* 33.2*  MCV 89.1   < > 89.9 90.5  PLT 245   < > 180 181   < > = values in this interval not displayed.   Basic Metabolic Panel:  Recent Labs  Lab 10/18/22 0500 10/18/22 1037 10/19/22 0614 10/19/22 0834 10/20/22 0456 10/20/22 0810  NA 147*   < > 147*   < > 141 147*  K 3.3*  --  3.4*  --  3.2*  --   CL 116*  --  112*  --  111  --   CO2 25  --  24  --  24  --   GLUCOSE 120*  --  115*  --  112*  --   BUN 10  --  11  --  14  --   CREATININE 0.73  --  0.62  --  0.66  --   CALCIUM 8.3*  --  8.7*  --  8.4*  --   MG 2.4  --  2.2  --   --   --   PHOS 2.3*  --  2.6  --   --   --    < > = values in this interval not displayed.   Lipid Panel:   Recent Labs  Lab 10/18/22 0500  CHOL 159  TRIG 116  HDL 43  CHOLHDL 3.7  VLDL 23  LDLCALC 93   HgbA1c:  Recent Labs  Lab 10/18/22 0500  HGBA1C 6.0*   Urine Drug Screen:  Recent Labs  Lab 10/16/22 1414  LABOPIA NONE DETECTED  COCAINSCRNUR POSITIVE*  LABBENZ NONE DETECTED  AMPHETMU NONE DETECTED  THCU NONE DETECTED  LABBARB NONE DETECTED    Alcohol Level  Recent Labs  Lab 10/16/22 0412  ETH <10    IMAGING past 24 hours VAS [place] TRANSCRANIAL DOPPLER W BUBBLES  Result Date: 10/20/2022  Transcranial Doppler with Bubble Patient Name:  Sheri Beck  Date of Exam:   10/20/2022 Medical Rec #: [id number]      Accession #:  [id number] Date of Birth: 01/21/1966       Patient Gender: F Patient Age:   61 years Exam Location:  [facility] Procedure:      VAS [place] TRANSCRANIAL DOPPLER W BUBBLES Referring Phys: Janine Ores --------------------------------------------------------------------------------  Indications: Stroke. Comparison Study: no prior Performing Technologist: Archie Patten RVS  Examination Guidelines: A complete evaluation includes B-mode imaging, spectral Doppler, color Doppler, and power Doppler as needed of all accessible portions of each vessel. Bilateral testing is considered an integral part of a complete examination. Limited examinations for reoccurring indications may be performed as noted.  Summary: No HITS at rest or during Valsalva. Negative transcranial Doppler Bubble study with no evidence of right to left intracardiac communication.  A vascular evaluation was performed. The left middle cerebral artery was studied. An IV was inserted into the patient's right forearm. Verbal informed consent was obtained.  *See table(s) above for TCD measurements and observations.    Preliminary    CT HEAD WO CONTRAST (5MM)  Result Date: 10/20/2022 CLINICAL DATA:  Stroke follow-up. EXAM: CT HEAD WITHOUT CONTRAST TECHNIQUE: Contiguous axial images were obtained from the base  of the skull through the vertex without intravenous contrast. RADIATION DOSE REDUCTION: This exam was performed according to the departmental dose-optimization program which includes automated exposure control, adjustment of the mA and/or kV according to patient size and/or use of iterative reconstruction technique. COMPARISON:  Head CT 10/17/2022 FINDINGS: Brain: A large infarct is again noted involving the entirety of the right MCA territory. Small foci of hemorrhage within the infarct have mildly increased in size, with the largest measuring 1.1 cm in the high right frontal lobe. Extensive cytotoxic edema is again noted with slightly increased mass effect including slightly increased effacement of the right lateral ventricle and 2 mm of new leftward midline shift. There is no evidence of ventricular entrapment, new infarct, or extra-axial fluid collection. The basilar cisterns are slightly effaced without tonsillar herniation. Age advanced chronic small vessel ischemia is again noted in the left cerebral hemispheric white matter, and there is a small chronic right cerebellar infarct. Vascular: Persistent hyperdense right MCA. Skull: Right-sided decompressive craniectomy with overlying soft tissue swelling and skin staples. Sinuses/Orbits: Unchanged partial opacification of a posterior right ethmoid air cell. Visualized mastoid air cells are clear. Bilateral proptosis. Other: None. IMPRESSION: Evolving large right MCA infarct with mild enlargement of small foci of hemorrhage and slightly increased mass effect, now with 2 mm of leftward midline shift. Electronically Signed   By: Logan Bores M.D.   On: 10/20/2022 10:36   VAS [place] LOWER EXTREMITY VENOUS (DVT)  Result Date: 10/20/2022  Lower Venous DVT Study Patient Name:  Sheri Beck  Date of Exam:   10/19/2022 Medical Rec #: [id number]      Accession #:    [id number] Date of Birth: 1966-06-29       Patient Gender: F Patient Age:   30 years Exam Location:  [facility] Procedure:      VAS [place] LOWER EXTREMITY VENOUS (DVT) Referring Phys: Elwin Sleight DE LA TORRE --------------------------------------------------------------------------------  Indications: Stroke.  Comparison Study: No prior studies. Performing Technologist: Darlin Coco RDMS, RVT  Examination Guidelines: A complete evaluation includes B-mode imaging, spectral Doppler, color Doppler, and power Doppler as needed of all accessible portions of each vessel. Bilateral testing is considered an integral part of a complete examination. Limited examinations for reoccurring indications may be performed as noted. The reflux portion of the exam is performed with the patient  in reverse Trendelenburg.  +---------+---------------+---------+-----------+----------+--------------+ RIGHT    CompressibilityPhasicitySpontaneityPropertiesThrombus Aging +---------+---------------+---------+-----------+----------+--------------+ CFV      Full           Yes      Yes                                 +---------+---------------+---------+-----------+----------+--------------+ SFJ      Full                                                        +---------+---------------+---------+-----------+----------+--------------+ FV Prox  Full                                                        +---------+---------------+---------+-----------+----------+--------------+ FV Mid   Full                                                        +---------+---------------+---------+-----------+----------+--------------+ FV DistalFull                                                        +---------+---------------+---------+-----------+----------+--------------+ PFV      Full                                                        +---------+---------------+---------+-----------+----------+--------------+ POP      Full           Yes      Yes                                  +---------+---------------+---------+-----------+----------+--------------+ PTV      Full                                                        +---------+---------------+---------+-----------+----------+--------------+ PERO     Full                                                        +---------+---------------+---------+-----------+----------+--------------+   +---------+---------------+---------+-----------+----------+--------------+ LEFT     CompressibilityPhasicitySpontaneityPropertiesThrombus Aging +---------+---------------+---------+-----------+----------+--------------+ CFV      Full           Yes      Yes                                 +---------+---------------+---------+-----------+----------+--------------+  SFJ      Full                                                        +---------+---------------+---------+-----------+----------+--------------+ FV Prox  Full                                                        +---------+---------------+---------+-----------+----------+--------------+ FV Mid   Full                                                        +---------+---------------+---------+-----------+----------+--------------+ FV DistalFull                                                        +---------+---------------+---------+-----------+----------+--------------+ PFV      Full                                                        +---------+---------------+---------+-----------+----------+--------------+ POP      Full           Yes      Yes                                 +---------+---------------+---------+-----------+----------+--------------+ PTV      Partial        Yes      Yes                  Acute          +---------+---------------+---------+-----------+----------+--------------+ PERO     Partial        Yes      Yes                  Acute           +---------+---------------+---------+-----------+----------+--------------+ Gastroc  None           No       No                   Acute          +---------+---------------+---------+-----------+----------+--------------+     Summary: RIGHT: - There is no evidence of deep vein thrombosis in the lower extremity.  - No cystic structure found in the popliteal fossa.  LEFT: - Findings consistent with acute deep vein thrombosis involving the left gastrocnemius veins, left posterior tibial veins, and left peroneal veins. - No cystic structure found in the popliteal fossa.  *See table(s) above for measurements and observations. Electronically signed by Waverly Ferrari MD on 10/20/2022 at 6:56:36 AM.    Final     PHYSICAL EXAM  Constitutional: Appears well-developed and well-nourished.  [name] Cardiovascular: Normal rate and regular rhythm.  Respiratory: snoring, rate/rhythm regular Skull shows right hemicraniectomy surgical defect with bandage surgical clips Neuro: Mental Status: Patient is lying with eyes open., nods appropriately. Follows commands on the right , no verbal output on my exam. Nursing states that she did say her name when asked.  Right gaze preference and unable to look to the left past midline.  Partial left hemineglect.  Speech is clear today with minimal dysarthria. Cranial Nerves: Eyes to the right with forced eye opening. Unable able to bring them midline.Left facial droop. Hearing is intact to voice Head turned to the right Motor: Moves right upper and lower extremity antigravity.  No movement noted on the left.  Sensory: Localizes to painful stimuli on the right. No movement with painful stimuli on the left. States she can not feel light or deep pain on the left but does acknowledge the left side    ASSESSMENT/PLAN Sheri Beck is a 57 y.o. female with history of hypertension presenting with left hemiplegia, depressed level of  consciousness and rightward gaze.   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  Patient appears to be a rapid progresser of malignant cerebral edema with significant established edema within few hours of onset Etiology:  large vessel occlusion right   middle cerebral artery etiology cryptogenic. Code Stroke CT head - Large Right MCA infarct. ASPECTS 2. No hemorrhagic transformation, and only minor intracranial mass effect at this time with no midline shift. CTA head & neck right M1 occlusion at its origin. CT Repeat 1/14 shows large right MCA infarct with cytotoxic edema and s/p right hemicraniectomy but no midline shift to the left.  Trace petechial hemorrhage at the periphery of the infarct. MRI  not done 2D Echo ejection fraction 60 to 65%. LDL 151 HgbA1c 6.3 VTE prophylaxis - scds    Diet   DIET DYS 2 Room service appropriate? No; Fluid consistency: Nectar Thick   No antithrombotic prior to admission, now on IV heparin for DVT left lower extremity.  Therapy recommendations: Inpatient rehab Disposition:  Pending   Brain Compression Hypertonic saline 50cc/hr S/p right frontal temporal parietal decompressive craniectomy with insertion of craniotomy flap into abdomen, JP drain in place  Hypertension Home meds:  Amlodipine 10mg , lisinopril-hydrochlorothiazide  Stable BP goal less than 160 PRN hydralazine and labetalol  Long-term BP goal normotensive  Hyperlipidemia Home meds:  None, resumed in hospital LDL 151, goal < 70 Add statin when no longer NPO  Other Stroke Risk Factors Obesity, Body mass index is 39.35 kg/m., BMI >/= 30 associated with increased stroke risk, recommend weight loss, diet and exercise as appropriate   Other Active Problems DVT left lower extremity  Hospital day # 4  Patient continues to show gradual neurological improvement hence will discontinue hypertonic  saline today given the CT report has suggested slight increase in cytotoxic edema and petechial hemorrhage.  Patient unfortunately has DVT in the left lower extremity and will need anticoagulation with heparin which carries a high risk of increasing her hemorrhagic transformation and intracranial bleeding but given patient's significant left hemiplegia and neglect she is at high risk for DVT propagation and pulmonary embolism hence we will do cautious anticoagulation with IV heparin.  Discussed with Dr. Arnoldo Morale neurosurgeon, Dr.Olalere critical care medicine and pharmacist and they are all in agreement with the plan.  Recommend close neurological follow-up and strict control of hypertension with systolic goal below 0000000   .  Marland Kitchen  No family available today during a.m. rounds at bedside for discussion... Will check TCD bubble study for PFO and right-to-left shunt..  Discussed with Dr.Olalere critical care medicine and Dr. Arnoldo Morale neurosurgery..This patient is critically ill and at significant risk of neurological worsening, death and care requires constant monitoring of vital signs, hemodynamics,respiratory and cardiac monitoring, extensive review of multiple databases, frequent neurological assessment, discussion with family, other specialists and medical decision making of high complexity.I have made any additions or clarifications directly to the above note.This critical care time does not reflect procedure time, or teaching time or supervisory time of PA/NP/Med Resident etc but could involve care discussion time.    I spent 32 minutes of neurocritical care time  in the care of  this patient.      Antony Contras, MD Medical Director [facility] Pager: [phone] 10/20/2022 2:16 PM  To contact Stroke Continuity provider, please refer to [email or web address] After hours, contact General Neurology

## 2022-10-20 NOTE — Progress Notes (Signed)
Occupational Therapy Treatment Patient Details Name: Sheri Beck MRN: [id number] DOB: May 20, 1966 Today's Date: 10/20/2022   History of present illness 57 yo female presents to [facility] on 1/13 with L facial droop, slurred speech, L weakness, UDS + cocaine. CTH shows large R MCA CVA. S/p R frontal temporal parietal decompressive craniectomy, insertion of craniotomy flap in abdomen on 1/13. PMHx: HTN, HLD, substance use   OT comments  Ellason is making good progress with notable improvement in LOA and motivation to participate. She required max A +2 for bed mobility. Once sitting EOB she was min-max A for balance and benefited from assist to prevent pushing with RUE. She also required max A +2 stand EOB 2x for 1 minute each. Encouraged midline attention throughout session, pt unable to deviate from R gaze. OT to continue to follow acutely. POC remains appropriate.    Recommendations for follow up therapy are one component of a multi-disciplinary discharge planning process, led by the attending physician.  Recommendations may be updated based on patient status, additional functional criteria and insurance authorization.    Follow Up Recommendations  Acute inpatient rehab (3hours/day)     Assistance Recommended at Discharge Frequent or constant Supervision/Assistance  Patient can return home with the following  A lot of help with walking and/or transfers;Two people to help with walking and/or transfers;A lot of help with bathing/dressing/bathroom;Two people to help with bathing/dressing/bathroom;Assistance with cooking/housework;Assistance with feeding;Direct supervision/assist for medications management;Direct supervision/assist for financial management;Assist for transportation;Help with stairs or ramp for entrance   Equipment Recommendations  Other (comment)    Recommendations for Other Services Rehab consult    Precautions / Restrictions Precautions Precautions: Fall Precaution Comments: no R  skull flap, abdominal flap Restrictions Weight Bearing Restrictions: No       Mobility Bed Mobility Overal bed mobility: Needs Assistance Bed Mobility: Supine to Sit, Sit to Supine     Supine to sit: Max assist, +2 for physical assistance, +2 for safety/equipment Sit to supine: Max assist, +2 for physical assistance, +2 for safety/equipment        Transfers Overall transfer level: Needs assistance Equipment used: 2 person hand held assist Transfers: Sit to/from Stand Sit to Stand: Max assist, +2 physical assistance, +2 safety/equipment           General transfer comment: stood 2x for ~1 minute each time     Balance Overall balance assessment: Needs assistance Sitting-balance support: Feet supported Sitting balance-Leahy Scale: Poor Sitting balance - Comments: mod A at least for sitting due to L lateral lean Postural control: Left lateral lean Standing balance support: Single extremity supported Standing balance-Leahy Scale: Zero                             ADL either performed or assessed with clinical judgement   ADL Overall ADL's : Needs assistance/impaired                         Toilet Transfer: Maximal assistance;+2 for physical assistance;+2 for safety/equipment;Stand-pivot Toilet Transfer Details (indicate cue type and reason): simulated at EOB         Functional mobility during ADLs: Maximal assistance;+2 for physical assistance;+2 for safety/equipment General ADL Comments: session focused on mobility, midline posture and standing as a precursor to ADLs    Extremity/Trunk Assessment Upper Extremity Assessment Upper Extremity Assessment: RUE deficits/detail;LUE deficits/detail RUE Deficits / Details: limited assessment but seemingly WFL. RUE Sensation: WNL  RUE Coordination: WNL LUE Deficits / Details: flaccid, increased tone. no response to noxious stim LUE Sensation: decreased light touch;decreased proprioception LUE  Coordination: decreased fine motor;decreased gross motor   Lower Extremity Assessment Lower Extremity Assessment: Defer to PT evaluation        Vision   Vision Assessment?: Vision impaired- to be further tested in functional context;Yes Ocular Range of Motion: Restricted on the left Alignment/Gaze Preference: Gaze right Visual Fields: Left visual field deficit Additional Comments: unable to come to midline, very strong fixed gaze to the R. R occlusion glasses donned for sesion   Perception     Praxis      Cognition Arousal/Alertness: Awake/alert Behavior During Therapy: Restless Overall Cognitive Status: Impaired/Different from baseline Area of Impairment: Attention, Memory, Following commands, Safety/judgement, Problem solving                   Current Attention Level: Sustained Memory: Decreased short-term memory Following Commands: Follows one step commands consistently, Follows one step commands with increased time Safety/Judgement: Decreased awareness of deficits   Problem Solving: Slow processing, Requires verbal cues General Comments: poor higher level functioning but following all commands, talking and dual tasking. verbalizing understanding of deficits and diet precautions        Exercises Exercises: Other exercises Other Exercises Other Exercises: R lateral lean onto elbow and push up x5    Shoulder Instructions       General Comments family/friends present and supportive, encouraged family to encourage midline gaze and low stim environment to allow for rest and recovery    Pertinent Vitals/ Pain       Pain Assessment Pain Assessment: Faces Faces Pain Scale: Hurts a little bit Pain Location: my head Pain Descriptors / Indicators: Guarding, Sore Pain Intervention(s): Limited activity within patient's tolerance, Monitored during session  Home Living Family/patient expects to be discharged to:: Private residence                Frequency   Min 2X/week        Progress Toward Goals  OT Goals(current goals can now be found in the care plan section)  Progress towards OT goals: Progressing toward goals  Acute Rehab OT Goals Patient Stated Goal: to see granddaughter OT Goal Formulation: With patient Time For Goal Achievement: 10/31/22 Potential to Achieve Goals: Good ADL Goals Pt Will Perform Grooming: with mod assist;sitting Pt Will Perform Upper Body Dressing: with mod assist;sitting Pt Will Transfer to Toilet: with max assist;stand pivot transfer;bedside commode Additional ADL Goal #1: pt will locate at least 3 ADL items in her L environment with max cues in preparation for activity Additional ADL Goal #2: Pt will complete bed mobiliy with mod A as a precursor to ADLs  Plan Discharge plan remains appropriate    Co-evaluation    PT/OT/SLP Co-Evaluation/Treatment: Yes Reason for Co-Treatment: Complexity of the patient's impairments (multi-system involvement);For patient/therapist safety;To address functional/ADL transfers   OT goals addressed during session: ADL's and self-care      AM-PAC OT "6 Clicks" Daily Activity     Outcome Measure   Help from another person eating meals?: A Lot Help from another person taking care of personal grooming?: A Lot Help from another person toileting, which includes using toliet, bedpan, or urinal?: Total Help from another person bathing (including washing, rinsing, drying)?: A Lot Help from another person to put on and taking off regular upper body clothing?: A Lot Help from another person to put on and taking off regular lower  body clothing?: Total 6 Click Score: 10    End of Session    OT Visit Diagnosis: Unsteadiness on feet (R26.81);Other abnormalities of gait and mobility (R26.89);Repeated falls (R29.6);Muscle weakness (generalized) (M62.81);Pain;Hemiplegia and hemiparesis Hemiplegia - Right/Left: Left Hemiplegia - dominant/non-dominant: Non-Dominant Hemiplegia -  caused by: Cerebral infarction   Activity Tolerance Patient tolerated treatment well   Patient Left in bed;with call bell/phone within reach;with bed alarm set;with family/visitor present   Nurse Communication Mobility status        Time: 3790-2409 OT Time Calculation (min): 36 min  Charges: OT General Charges $OT Visit: 1 Visit OT Treatments $Therapeutic Activity: 8-22 mins    Elliot Cousin 10/20/2022, 12:58 PM

## 2022-10-20 NOTE — Progress Notes (Signed)
ANTICOAGULATION CONSULT NOTE- follow-up  Pharmacy Consult for heparin Indication:  acute DVT, stroke protocol  Heparin Dosing Weight: 73.4 kg  Labs: Recent Labs    10/18/22 0500 10/19/22 0614 10/20/22 0456 10/20/22 1735  HGB 11.7* 11.3* 10.8*  --   HCT 35.4* 33.9* 33.2*  --   PLT 184 180 181  --   HEPARINUNFRC  --   --   --  0.55  CREATININE 0.73 0.62 0.66  --      Assessment: 35 yof presenting 1/13 with R MCA stroke, not given TNK with patient deemed high risk for hemorrhagic conversion with concern for significant edema with mass effect and possible herniation, s/p craniotomy 1/13. Repeat imaging with large R MCA infarct and progressive cytoxic edema but no MLS or malignant hemorrhagic transformation. LE dopplers positive for acute DVT in LLE. Pharmacy consulted to start heparin infusion per stroke protocol.  Currently on heparin 5000 units  q8h for VTE prophylaxis, last dose at 0511 this morning. Hg down a bit to 10.8, plt WNL. No active bleed issues reported. Patient is not on anticoagulation PTA.  10/20/2022 PM update: HL: 0.55 (supratherapeutic) No signs of bleeding No issues with heparin infusion  Goal of Therapy:  Heparin level 0.3-0.5 units/ml Monitor platelets by anticoagulation protocol: Yes   Plan:  Reduce heparin to 800 units/hr Check 6hr heparin level Monitor daily CBC, s/sx bleeding   Layana Konkel BS, PharmD, BCPS Clinical Pharmacist 10/20/2022 6:15 PM  Contact: [phone] after 3 PM  "Be curious, not judgmental..." -Jamal Maes

## 2022-10-20 NOTE — Progress Notes (Signed)
TCD bubble study has been completed.   Preliminary results in CV Proc.   Sheri Beck Sheri Beck Sheri Beck Sheri Beck 10/20/2022 1:32 PM

## 2022-10-21 DIAGNOSIS — I639 Cerebral infarction, unspecified: Secondary | ICD-10-CM | POA: Diagnosis not present

## 2022-10-21 DIAGNOSIS — F320 Major depressive disorder, single episode, mild: Secondary | ICD-10-CM | POA: Insufficient documentation

## 2022-10-21 DIAGNOSIS — F19239 Other psychoactive substance dependence with withdrawal, unspecified: Secondary | ICD-10-CM | POA: Diagnosis present

## 2022-10-21 DIAGNOSIS — R0683 Snoring: Secondary | ICD-10-CM | POA: Insufficient documentation

## 2022-10-21 DIAGNOSIS — Z789 Other specified health status: Secondary | ICD-10-CM | POA: Insufficient documentation

## 2022-10-21 DIAGNOSIS — F1420 Cocaine dependence, uncomplicated: Secondary | ICD-10-CM | POA: Insufficient documentation

## 2022-10-21 DIAGNOSIS — F4321 Adjustment disorder with depressed mood: Secondary | ICD-10-CM | POA: Insufficient documentation

## 2022-10-21 DIAGNOSIS — Z720 Tobacco use: Secondary | ICD-10-CM | POA: Insufficient documentation

## 2022-10-21 DIAGNOSIS — F1290 Cannabis use, unspecified, uncomplicated: Secondary | ICD-10-CM | POA: Insufficient documentation

## 2022-10-21 DIAGNOSIS — F05 Delirium due to known physiological condition: Secondary | ICD-10-CM | POA: Insufficient documentation

## 2022-10-21 LAB — CBC
HCT: 30.9 % — ABNORMAL LOW (ref 36.0–46.0)
Hemoglobin: 10.9 g/dL — ABNORMAL LOW (ref 12.0–15.0)
MCH: 30.6 pg (ref 26.0–34.0)
MCHC: 35.3 g/dL (ref 30.0–36.0)
MCV: 86.8 fL (ref 80.0–100.0)
Platelets: 194 10*3/uL (ref 150–400)
RBC: 3.56 MIL/uL — ABNORMAL LOW (ref 3.87–5.11)
RDW: 13 % (ref 11.5–15.5)
WBC: 9.6 10*3/uL (ref 4.0–10.5)
nRBC: 0 % (ref 0.0–0.2)

## 2022-10-21 LAB — GLUCOSE, CAPILLARY
Glucose-Capillary: 117 mg/dL — ABNORMAL HIGH (ref 70–99)
Glucose-Capillary: 119 mg/dL — ABNORMAL HIGH (ref 70–99)
Glucose-Capillary: 121 mg/dL — ABNORMAL HIGH (ref 70–99)
Glucose-Capillary: 130 mg/dL — ABNORMAL HIGH (ref 70–99)
Glucose-Capillary: 130 mg/dL — ABNORMAL HIGH (ref 70–99)
Glucose-Capillary: 133 mg/dL — ABNORMAL HIGH (ref 70–99)
Glucose-Capillary: 151 mg/dL — ABNORMAL HIGH (ref 70–99)

## 2022-10-21 LAB — HEPARIN LEVEL (UNFRACTIONATED): Heparin Unfractionated: 0.46 IU/mL (ref 0.30–0.70)

## 2022-10-21 LAB — CARDIOLIPIN ANTIBODIES, IGG, IGM, IGA
Anticardiolipin IgA: <9 APL U/mL (ref 0–11)
Anticardiolipin IgG: <9 GPL U/mL (ref 0–14)
Anticardiolipin IgM: <9 MPL U/mL (ref 0–12)

## 2022-10-21 LAB — SICKLE CELL SCREEN: Sickle Cell Screen: NEGATIVE

## 2022-10-21 LAB — ANA W/REFLEX IF POSITIVE: Anti Nuclear Antibody (ANA): NEGATIVE

## 2022-10-21 MED ORDER — ESCITALOPRAM OXALATE 10 MG PO TABS
5.0000 mg | ORAL_TABLET | Freq: Every day | ORAL | Status: DC
  Administered 2022-10-21 – 2022-10-23 (×3): 5 mg via ORAL
  Filled 2022-10-21 (×3): qty 1

## 2022-10-21 MED ORDER — APIXABAN 5 MG PO TABS
5.0000 mg | ORAL_TABLET | Freq: Two times a day (BID) | ORAL | Status: DC
  Administered 2022-10-21 – 2022-10-23 (×5): 5 mg via ORAL
  Filled 2022-10-21 (×5): qty 1

## 2022-10-21 MED ORDER — PANTOPRAZOLE SODIUM 40 MG PO TBEC
40.0000 mg | DELAYED_RELEASE_TABLET | Freq: Every day | ORAL | Status: DC
  Administered 2022-10-21 – 2022-10-22 (×2): 40 mg via ORAL
  Filled 2022-10-21 (×2): qty 1

## 2022-10-21 NOTE — Progress Notes (Signed)
Providing Compassionate, Quality Care - Together   Subjective: Patient reports she feels bad when asked how she is doing today. She states, "I feel like killing myself." She doesn't appear to have a plan. A family member called the hospital room and the patient relayed the same information to them.  Objective: Vital signs in last 24 hours: Temp:  [98.2 F (36.8 C)-98.8 F (37.1 C)] 98.2 F (36.8 C) (01/18 1147) Pulse Rate:  [60-82] 72 (01/18 1147) Resp:  [15-24] 19 (01/18 1147) BP: (119-148)/(73-99) 119/73 (01/18 1147) SpO2:  [96 %-99 %] 96 % (01/18 1147) Weight:  [99.7 kg] 99.7 kg (01/18 0500)  Intake/Output from previous day: 01/17 0701 - 01/18 0700 In: 903.1 [P.O.:720; I.V.:183.1] Out: 950 [Urine:950] Intake/Output this shift: No intake/output data recorded.  Alert and oriented x 3 Speech clear Left hemiplegia RUE, RLE strength/sensation intact   Lab Results: Recent Labs    10/20/22 0456 10/21/22 0447  WBC 10.3 9.6  HGB 10.8* 10.9*  HCT 33.2* 30.9*  PLT 181 194   BMET Recent Labs    10/19/22 0614 10/19/22 0834 10/20/22 0456 10/20/22 0810  NA 147*   < > 141 147*  K 3.4*  --  3.2*  --   CL 112*  --  111  --   CO2 24  --  24  --   GLUCOSE 115*  --  112*  --   BUN 11  --  14  --   CREATININE 0.62  --  0.66  --   CALCIUM 8.7*  --  8.4*  --    < > = values in this interval not displayed.    Studies/Results: VAS [place] TRANSCRANIAL DOPPLER W BUBBLES  Result Date: 10/21/2022  Transcranial Doppler with Bubble Patient Name:  EPIFANIA LITTRELL Horney  Date of Exam:   10/20/2022 Medical Rec #: [id number]      Accession #:    [id number] Date of Birth: [birth year]       Patient Gender: F Patient Age:   57 years Exam Location:  [facility] Procedure:      VAS [place] TRANSCRANIAL DOPPLER W BUBBLES Referring Phys: Janine Ores --------------------------------------------------------------------------------  Indications: Stroke. Comparison Study: no prior Performing Technologist:  Archie Patten RVS  Examination Guidelines: A complete evaluation includes B-mode imaging, spectral Doppler, color Doppler, and power Doppler as needed of all accessible portions of each vessel. Bilateral testing is considered an integral part of a complete examination. Limited examinations for reoccurring indications may be performed as noted.  Summary: No HITS at rest or during Valsalva. Negative transcranial Doppler Bubble study with no evidence of right to left intracardiac communication.  A vascular evaluation was performed. The left middle cerebral artery was studied. An IV was inserted into the patient's right forearm. Verbal informed consent was obtained.  Negative TCD Bubble study *See table(s) above for TCD measurements and observations.  Diagnosing physician: Antony Contras MD Electronically signed by Antony Contras MD on 10/21/2022 at 8:56:03 AM.    Final    CT HEAD WO CONTRAST (5MM)  Result Date: 10/20/2022 CLINICAL DATA:  Stroke follow-up. EXAM: CT HEAD WITHOUT CONTRAST TECHNIQUE: Contiguous axial images were obtained from the base of the skull through the vertex without intravenous contrast. RADIATION DOSE REDUCTION: This exam was performed according to the departmental dose-optimization program which includes automated exposure control, adjustment of the mA and/or kV according to patient size and/or use of iterative reconstruction technique. COMPARISON:  Head CT 10/17/2022 FINDINGS: Brain: A large infarct is  again noted involving the entirety of the right MCA territory. Small foci of hemorrhage within the infarct have mildly increased in size, with the largest measuring 1.1 cm in the high right frontal lobe. Extensive cytotoxic edema is again noted with slightly increased mass effect including slightly increased effacement of the right lateral ventricle and 2 mm of new leftward midline shift. There is no evidence of ventricular entrapment, new infarct, or extra-axial fluid collection. The basilar  cisterns are slightly effaced without tonsillar herniation. Age advanced chronic small vessel ischemia is again noted in the left cerebral hemispheric white matter, and there is a small chronic right cerebellar infarct. Vascular: Persistent hyperdense right MCA. Skull: Right-sided decompressive craniectomy with overlying soft tissue swelling and skin staples. Sinuses/Orbits: Unchanged partial opacification of a posterior right ethmoid air cell. Visualized mastoid air cells are clear. Bilateral proptosis. Other: None. IMPRESSION: Evolving large right MCA infarct with mild enlargement of small foci of hemorrhage and slightly increased mass effect, now with 2 mm of leftward midline shift. Electronically Signed   By: Sebastian Ache M.D.   On: 10/20/2022 10:36   VAS [place] LOWER EXTREMITY VENOUS (DVT)  Result Date: 10/20/2022  Lower Venous DVT Study Patient Name:  FAJR FIFE Hoog  Date of Exam:   10/19/2022 Medical Rec #: [id number]      Accession #:    [id number] Date of Birth: Sep 16, 1966       Patient Gender: F Patient Age:   68 years Exam Location:  [facility] Procedure:      VAS [place] LOWER EXTREMITY VENOUS (DVT) Referring Phys: Dewitt Hoes DE LA TORRE --------------------------------------------------------------------------------  Indications: Stroke.  Comparison Study: No prior studies. Performing Technologist: Jean Rosenthal RDMS, RVT  Examination Guidelines: A complete evaluation includes B-mode imaging, spectral Doppler, color Doppler, and power Doppler as needed of all accessible portions of each vessel. Bilateral testing is considered an integral part of a complete examination. Limited examinations for reoccurring indications may be performed as noted. The reflux portion of the exam is performed with the patient in reverse Trendelenburg.  +---------+---------------+---------+-----------+----------+--------------+ RIGHT    CompressibilityPhasicitySpontaneityPropertiesThrombus Aging  +---------+---------------+---------+-----------+----------+--------------+ CFV      Full           Yes      Yes                                 +---------+---------------+---------+-----------+----------+--------------+ SFJ      Full                                                        +---------+---------------+---------+-----------+----------+--------------+ FV Prox  Full                                                        +---------+---------------+---------+-----------+----------+--------------+ FV Mid   Full                                                        +---------+---------------+---------+-----------+----------+--------------+  FV DistalFull                                                        +---------+---------------+---------+-----------+----------+--------------+ PFV      Full                                                        +---------+---------------+---------+-----------+----------+--------------+ POP      Full           Yes      Yes                                 +---------+---------------+---------+-----------+----------+--------------+ PTV      Full                                                        +---------+---------------+---------+-----------+----------+--------------+ PERO     Full                                                        +---------+---------------+---------+-----------+----------+--------------+   +---------+---------------+---------+-----------+----------+--------------+ LEFT     CompressibilityPhasicitySpontaneityPropertiesThrombus Aging +---------+---------------+---------+-----------+----------+--------------+ CFV      Full           Yes      Yes                                 +---------+---------------+---------+-----------+----------+--------------+ SFJ      Full                                                         +---------+---------------+---------+-----------+----------+--------------+ FV Prox  Full                                                        +---------+---------------+---------+-----------+----------+--------------+ FV Mid   Full                                                        +---------+---------------+---------+-----------+----------+--------------+ FV DistalFull                                                        +---------+---------------+---------+-----------+----------+--------------+  PFV      Full                                                        +---------+---------------+---------+-----------+----------+--------------+ POP      Full           Yes      Yes                                 +---------+---------------+---------+-----------+----------+--------------+ PTV      Partial        Yes      Yes                  Acute          +---------+---------------+---------+-----------+----------+--------------+ PERO     Partial        Yes      Yes                  Acute          +---------+---------------+---------+-----------+----------+--------------+ Gastroc  None           No       No                   Acute          +---------+---------------+---------+-----------+----------+--------------+     Summary: RIGHT: - There is no evidence of deep vein thrombosis in the lower extremity.  - No cystic structure found in the popliteal fossa.  LEFT: - Findings consistent with acute deep vein thrombosis involving the left gastrocnemius veins, left posterior tibial veins, and left peroneal veins. - No cystic structure found in the popliteal fossa.  *See table(s) above for measurements and observations. Electronically signed by Deitra Mayo MD on 10/20/2022 at 6:56:36 AM.    Final     Assessment/Plan: The patient is 5 days status post craniectomy for decompression due to cytotoxic edema following a large right MCA infarct. Patient was  stable for discharge to a skilled nursing facility and was to be transitioned to 5 mg apixiban for anticoagulation of her DVTs. However, she expressed the intent to harm herself during today's assessment. A psychiatric consult has been ordered. The patient has been put on suicide precautions with a sitter.  The patient will be able to discharge once psychiatry has cleared her.      LOS: 5 days      Viona Gilmore, DNP, AGNP-C Nurse Practitioner  [facility] [place] [address], [address], [place], [place] [address] P: [phone]    F: [phone]  10/21/2022, 11:58 AM

## 2022-10-21 NOTE — Discharge Instructions (Addendum)
Information on my medicine - ELIQUIS (apixaban)  This medication education was reviewed with me or my healthcare representative as part of my discharge preparation.     Why was Eliquis prescribed for you? Eliquis was prescribed to treat blood clots that may have been found in the veins of your legs (deep vein thrombosis) or in your lungs (pulmonary embolism) and to reduce the risk of them occurring again.  What do You need to know about Eliquis ? The  dose is ONE 5 mg tablet taken TWICE daily.  Eliquis may be taken with or without food.   Try to take the dose about the same time in the morning and in the evening. If you have difficulty swallowing the tablet whole please discuss with your pharmacist how to take the medication safely.  Take Eliquis exactly as prescribed and DO NOT stop taking Eliquis without talking to the doctor who prescribed the medication.  Stopping may increase your risk of developing a new blood clot.  Refill your prescription before you run out.  After discharge, you should have regular check-up appointments with your healthcare provider that is prescribing your Eliquis.    What do you do if you miss a dose? If a dose of ELIQUIS is not taken at the scheduled time, take it as soon as possible on the same day and twice-daily administration should be resumed. The dose should not be doubled to make up for a missed dose.  Important Safety Information A possible side effect of Eliquis is bleeding. You should call your healthcare provider right away if you experience any of the following: Bleeding from an injury or your nose that does not stop. Unusual colored urine (red or dark brown) or unusual colored stools (red or black). Unusual bruising for unknown reasons. A serious fall or if you hit your head (even if there is no bleeding).  Some medicines may interact with Eliquis and might increase your risk of bleeding or clotting while on Eliquis. To help avoid this,  consult your healthcare provider or pharmacist prior to using any new prescription or non-prescription medications, including herbals, vitamins, non-steroidal anti-inflammatory drugs (NSAIDs) and supplements.  This website has more information on Eliquis (apixaban): http://www.eliquis.com/eliquis/home  ===================================  Deep Vein Thrombosis    Deep vein thrombosis (DVT) is a condition in which a blood clot forms in a deep vein, such as a lower leg, thigh, or arm vein. A clot is blood that has thickened into a gel or solid. This condition is dangerous. It can lead to serious and even life-threatening complications if the clot travels to the lungs and causes a blockage (pulmonary embolism). It can also damage veins in the leg. This can result in leg pain, swelling, discoloration, and sores (post-thrombotic syndrome).  What are the causes? This condition may be caused by: A slowdown of blood flow. Damage to a vein. A condition that causes blood to clot more easily, such as an inherited clotting disorder.  What increases the risk? The following factors may make you more likely to develop this condition: Being overweight. Being older, especially over age 20. Sitting or lying down for more than four hours. Being in the hospital. Lack of physical activity (sedentary lifestyle). Pregnancy, being in childbirth, or having recently given birth. Taking medicines that contain estrogen, such as medicines to prevent pregnancy. Smoking. A history of any of the following: Blood clots or a blood clotting disease. Peripheral vascular disease. Inflammatory bowel disease. Cancer. Heart disease. Genetic conditions that affect  how your blood clots, such as Factor V Leiden mutation. Neurological diseases that affect your legs (leg paresis). A recent injury, such as a car accident. Major or lengthy surgery. A central line placed inside a large vein.  What are the signs or  symptoms? Symptoms of this condition include: Swelling, pain, or tenderness in an arm or leg. Warmth, redness, or discoloration in an arm or leg. If the clot is in your leg, symptoms may be more noticeable or worse when you stand or walk. Some people may not develop any symptoms.  How is this diagnosed? This condition is diagnosed with: A medical history and physical exam. Tests, such as: Blood tests. These are done to check how well your blood clots. Ultrasound. This is done to check for clots. Venogram. For this test, contrast dye is injected into a vein and X-rays are taken to check for any clots  How is this treated? Treatment for this condition depends on: The cause of your DVT. Your risk for bleeding or developing more clots. Any other medical conditions that you have. Treatment may include: Taking a blood thinner (anticoagulant). This type of medicine prevents clots from forming. It may be taken by mouth, injected under the skin, or injected through an IV (catheter). Injecting clot-dissolving medicines into the affected vein (catheter-directed thrombolysis). Having surgery. Surgery may be done to: Remove the clot. Place a filter in a large vein to catch blood clots before they reach the lungs. Some treatments may be continued for up to six months.  Follow these instructions at home: If you are taking blood thinners: Take the medicine exactly as told by your health care provider. Some blood thinners need to be taken at the same time every day. Do not skip a dose. Talk with your health care provider before you take any medicines that contain aspirin or NSAIDs. These medicines increase your risk for dangerous bleeding. Ask your health care provider about foods and drugs that could change the way the medicine works (may interact). Avoid those things if your health care provider tells you to do so. Blood thinners can cause easy bruising and may make it difficult to stop bleeding.  Because of this: Be very careful when using knives, scissors, or other sharp objects. Use an electric razor instead of a blade. Avoid activities that could cause injury or bruising, and follow instructions about how to prevent falls. Wear a medical alert bracelet or carry a card that lists what medicines you take.  General instructions Take over-the-counter and prescription medicines only as told by your health care provider. Return to your normal activities as told by your health care provider. Ask your health care provider what activities are safe for you. Wear compression stockings if recommended by your health care provider. Keep all follow-up visits as told by your health care provider. This is important.  How is this prevented? To lower your risk of developing this condition again: For 30 or more minutes every day, do an activity that: Involves moving your arms and legs. Increases your heart rate. When traveling for longer than four hours: Exercise your arms and legs every hour. Drink plenty of water. Avoid drinking alcohol. Avoid sitting or lying for a long time without moving your legs. If you have surgery or you are hospitalized, ask about ways to prevent blood clots. These may include taking frequent walks or using anticoagulants. Stay at a healthy weight. If you are a woman who is older than age 60, avoid unnecessary  use of medicines that contain estrogen, such as some birth control pills. Do not use any products that contain nicotine or tobacco, such as cigarettes and e-cigarettes. This is especially important if you take estrogen medicines. If you need help quitting, ask your health care provider.  Contact a health care provider if: You miss a dose of your blood thinner. Your menstrual period is heavier than usual. You have unusual bruising.  Get help right away if: You have: New or increased pain, swelling, or redness in an arm or leg. Numbness or tingling in an arm  or leg. Shortness of breath. Chest pain. A rapid or irregular heartbeat. A severe headache or confusion. A cut that will not stop bleeding. There is blood in your vomit, stool, or urine. You have a serious fall or accident, or you hit your head. You feel light-headed or dizzy. You cough up blood.  These symptoms may represent a serious problem that is an emergency. Do not wait to see if the symptoms will go away. Get medical help right away. Call your local emergency services (911 in the [place] Do not drive yourself to the hospital. Summary Deep vein thrombosis (DVT) is a condition in which a blood clot forms in a deep vein, such as a lower leg, thigh, or arm vein. Symptoms can include swelling, warmth, pain, and redness in your leg or arm. This condition may be treated with a blood thinner (anticoagulant medicine), medicine that is injected to dissolve blood clots,compression stockings, or surgery. If you are prescribed blood thinners, take them exactly as told. This information is not intended to replace advice given to you by your health care provider. Make sure you discuss any questions you have with your health care provider. Document Revised: 09/02/2017 Document Reviewed: 02/18/2017 Elsevier Patient Education  2020 [employer]    **Remove staples on 10/26/2022**

## 2022-10-21 NOTE — Progress Notes (Signed)
ANTICOAGULATION CONSULT NOTE - Follow Up Consult  Pharmacy Consult for heparin Indication:  DVT in setting of stroke  Labs: Recent Labs    10/18/22 0500 10/19/22 0614 10/20/22 0456 10/20/22 1735 10/21/22 0332  HGB 11.7* 11.3* 10.8*  --   --   HCT 35.4* 33.9* 33.2*  --   --   PLT 184 180 181  --   --   HEPARINUNFRC  --   --   --  0.55 0.46  CREATININE 0.73 0.62 0.66  --   --     Assessment: 57yo female therapeutic on heparin after rate change though at higher end of goal; no infusion issues or signs of bleeding per RN.  Goal of Therapy:  Heparin level 0.3-0.5 units/ml   Plan:  Will decrease heparin infusion by 1 units/kg/hr to 700 units/hr and check level in 6 hours.    Wynona Neat, PharmD, BCPS  10/21/2022,4:25 AM

## 2022-10-21 NOTE — Plan of Care (Signed)
  Problem: Skin Integrity: Goal: Demonstration of wound healing without infection will improve Outcome: Progressing   

## 2022-10-21 NOTE — Progress Notes (Signed)
Physical Therapy Treatment Patient Details Name: Sheri Beck MRN: [id number] DOB: 1966/02/22 Today's Date: 10/21/2022   History of Present Illness 57 yo female presents to [facility] on 1/13 with L facial droop, slurred speech, L weakness, UDS + cocaine. CTH shows large R MCA CVA. S/p R frontal temporal parietal decompressive craniectomy, insertion of craniotomy flap in abdomen on 1/13. PMHx: HTN, HLD, substance use    PT Comments    Pt greeted propped in bed with sitter and family present and agreeable to session. Pt with noted decreased awareness of deficits, asking if this PTA was present to take to her to shower after introducing self. Pt continues to require max assist +2 to complete bed mobility, transfers deferred as pt with MAX difficulty remaining on task. Pt able to follow all commands, however pt unable to remain on task at hand (example: asking pt to hold back of chair with RUE with pt holding then letting go after ~3 seconds needing continuous cues to return hand and maintain). Current plan remains appropriate to address deficits and maximize functional independence and decrease caregiver burden. Pt continues to benefit from skilled PT services to progress toward functional mobility goals.     Recommendations for follow up therapy are one component of a multi-disciplinary discharge planning process, led by the attending physician.  Recommendations may be updated based on patient status, additional functional criteria and insurance authorization.  Follow Up Recommendations  Acute inpatient rehab (3hours/day)     Assistance Recommended at Discharge Frequent or constant Supervision/Assistance  Patient can return home with the following Two people to help with walking and/or transfers;Two people to help with bathing/dressing/bathroom;Direct supervision/assist for medications management;Assistance with cooking/housework;Direct supervision/assist for financial management;Assist for  transportation;Help with stairs or ramp for entrance   Equipment Recommendations  Hospital bed;Wheelchair (measurements PT);Wheelchair cushion (measurements PT);Other (comment)    Recommendations for Other Services       Precautions / Restrictions Precautions Precautions: Fall Precaution Comments: no R skull flap, abdominal flap Restrictions Weight Bearing Restrictions: No     Mobility  Bed Mobility Overal bed mobility: Needs Assistance Bed Mobility: Supine to Sit, Sit to Supine     Supine to sit: Max assist, +2 for physical assistance, +2 for safety/equipment Sit to supine: Max assist, +2 for physical assistance, +2 for safety/equipment   General bed mobility comments: assist for L LE off bed and lifting trunk and scooting L hip; sit to supine assist for legs onto bed and repositioning trunk    Transfers                   General transfer comment: deferred for safety as pt with max difficulty remaining on task    Ambulation/Gait                   Stairs             Wheelchair Mobility    Modified Rankin (Stroke Patients Only) Modified Rankin (Stroke Patients Only) Pre-Morbid Rankin Score: No symptoms Modified Rankin: Severe disability     Balance Overall balance assessment: Needs assistance Sitting-balance support: Feet supported Sitting balance-Leahy Scale: Poor Sitting balance - Comments: mod A at least for sitting due to L lateral lean. Work at [employer] >10 min on finding and staying at [employer] upright sitting. Postural control: Left lateral lean  Cognition Arousal/Alertness: Awake/alert Behavior During Therapy: Restless, Impulsive Overall Cognitive Status: Impaired/Different from baseline Area of Impairment: Attention, Memory, Following commands, Safety/judgement, Problem solving                   Current Attention Level: Sustained Memory: Decreased short-term memory Following  Commands: Follows one step commands consistently, Follows one step commands with increased time Safety/Judgement: Decreased awareness of deficits   Problem Solving: Slow processing, Requires verbal cues General Comments: poor higher level functioning but following all commands with increased time, however not sustaining commands, (ex. reaching and holding rail but letting go quickly) needing conssitent cues to remain on task. pt with poor insight into deficits asking this PTA if i was there to get he to shower, when explained to pt she would need to ambualte there she said "okay i can do that"        Exercises Other Exercises Other Exercises: sustained stretch of cervial spine in supine and sitting    General Comments General comments (skin integrity, edema, etc.): family/friends present and supportive, encouraged family to encourage midline gaze and low stim environment to allow for rest and recovery, sitter present throughout, psychiatry arriving at end of session      Pertinent Vitals/Pain Pain Assessment Pain Assessment: Faces Faces Pain Scale: Hurts even more Pain Location: head Pain Descriptors / Indicators: Aching Pain Intervention(s): Monitored during session, Limited activity within patient's tolerance, Repositioned    Home Living                          Prior Function            PT Goals (current goals can now be found in the care plan section) Acute Rehab PT Goals PT Goal Formulation: With patient Time For Goal Achievement: 10/31/22 Progress towards PT goals: Progressing toward goals    Frequency    Min 4X/week      PT Plan Current plan remains appropriate;Frequency needs to be updated    Co-evaluation              AM-PAC PT "6 Clicks" Mobility   Outcome Measure  Help needed turning from your back to your side while in a flat bed without using bedrails?: A Lot Help needed moving from lying on your back to sitting on the side of a flat  bed without using bedrails?: Total Help needed moving to and from a bed to a chair (including a wheelchair)?: Total Help needed standing up from a chair using your arms (e.g., wheelchair or bedside chair)?: Total Help needed to walk in hospital room?: Total Help needed climbing 3-5 steps with a railing? : Total 6 Click Score: 7    End of Session   Activity Tolerance: Patient tolerated treatment well;Patient limited by pain Patient left: in bed;with call bell/phone within reach;with family/visitor present;with bed alarm set;with nursing/sitter in room;Other (comment) (with bed in chair position, with Psychiatry present) Nurse Communication: Mobility status PT Visit Diagnosis: Other abnormalities of gait and mobility (R26.89);Hemiplegia and hemiparesis Hemiplegia - Right/Left: Left Hemiplegia - dominant/non-dominant: Non-dominant Hemiplegia - caused by: Cerebral infarction     Time: 1761-6073 PT Time Calculation (min) (ACUTE ONLY): 21 min  Charges:  $Therapeutic Activity: 8-22 mins                     Braylen Staller R. PTA Acute Rehabilitation Services Office: [place] 10/21/2022, 2:39 PM

## 2022-10-21 NOTE — Plan of Care (Signed)
  Problem: Clinical Measurements: Goal: Ability to maintain clinical measurements within normal limits will improve Outcome: Progressing   Problem: Pain Managment: Goal: General experience of comfort will improve Outcome: Progressing   Problem: Safety: Goal: Ability to remain free from injury will improve Outcome: Progressing   Problem: Skin Integrity: Goal: Risk for impaired skin integrity will decrease Outcome: Progressing   Problem: Safety: Goal: Ability to disclose and discuss suicidal ideas will improve Outcome: Progressing Goal: Ability to identify and utilize support systems that promote safety will improve Outcome: Progressing

## 2022-10-21 NOTE — Consult Note (Addendum)
[facility] New Face-to-Face Psychiatric Evaluation  Service Date: October 21, 2022 LOS:  LOS: 5 days   Assessment  Sheri Beck is a 57 y.o. female admitted medically for 10/16/2022  4:04 AM for MCA stroke. She carries the psychiatric diagnoses of stimulant (crack cocaine) use disorder and has a past medical history of HTN . Consult / Liaison Psychiatry was consulted for suicidal statement by nurse practitioner Viona Gilmore  from the  [facility] service.  On initial presentation, for the past 2 weeks prior to admission, patient endorses depressed mood, anhedonia, feelings of worthlessness, poor concentration, and unintended weight loss. Negative for hypomanic / manic symptoms.  Since admitted for stroke, she experienced active suicidal ideation with intent to pull out her staples with a knife with the intent to kill herself. She has never experienced SI previously. Patient uses crack cocaine regularly and tested positive on urine toxicology on 1/13.  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  Last night she thought she was talking to her fianc, but he was not there.  She also thought she was at the grocery store for a brief moment but realized she was not. Given patient s/p stroke, admitted to inpatient hospital, and 4 days s/p positive cocaine on UDS, patient likely has delirium due to multiple etiologies, acute, hypoactive  these diagnoses are provisional diagnoses and subject to change as the patient's clinical picture evolves or new information is revealed, including substances (drugs of abuse, medications), another medical condition, or better explained by another psychiatric diagnosis.  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 Based on my clinical evaluation, I estimate the patient to be at medium risk of self harm in the current setting - At this time, we recommend a in-person one-to-one level of observation. This decision is based on my review of the chart including patient's history and current presentation, interview of the patient, mental status examination, and consideration of suicide risk including evaluating suicidal ideation, plan, intent, suicidal or self-harm behaviors, risk factors, and protective factors. This judgment is based on our ability to directly address suicide risk, implement suicide prevention strategies and develop a safety plan while the patient is in the clinical setting. - Please contact our team if there is a concern that risk level has changed  ## Interventions (medications, psychoeducation, etc):  - Start escitalopram 5 mg daily for treatment of depressive symptoms  ## Medical Decision Making Capacity:  - Not formally assessed during this encounter  ## Further Work-up: - most recent EKG on 10/16/2022 had QTc of 444 - pertinent labwork reviewed earlier this admission includes: CMP, lipid panel, CBC w/ diff, TSH, HbA1c, blood and urine toxicology  ##  Disposition:  - No indication for inpatient psychiatric admission.  Recommending outpatient psychiatry / psychology follow-up. Defer immediate disposition plan to primary team. Recommend referral to rehab facility and treatment for substance use - resources will be provided at discharge in AVS.  ## Behavioral / Environmental:  -- Standard delirium precautions and Fall precautions  ##Legal Status -- Patient voluntary  Thank you for this consult request. Our recommendations are listed above.  We will continue to follow patient's hospital course  Augusto Gamble, MD  NEW history  Relevant Aspects of Hospital Course:  Admitted on 10/16/2022 for MCA stroke.  Patient Report:  Patient says she felt depressed, not able to do what she enjoys (using crack cocaine), poor concentration, unintended weight loss of 8 pounds, and feelings of worthlessness.  Today she felt like she wanted to kill herself by taking a knife and taking out the staples in her head, but was able to talk to family and felt better.  She says the longest time she has stayed awake and felt like she did not need sleep was 2 days while she was on cocaine.  She believes she has bipolar because "I just go up and down."  She says she was never formally diagnosed with bipolar disorder.  She wants her fianc to buy her ring.  She feels that he does not listen to her.  She uses alcohol, marijuana, and crack cocaine.  She has a daughter and granddaughter.  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 Father supports her financially as well. She asked if we would start her on a medication, and was amenable to starting escitalopram.  She is interested in going to substance use rehab.  She is grateful for psychiatry to come talk to her and feels she was heard.  She is looking forward to leaving the hospital and going home but understands she has to do physical therapy and rehab first.  Collateral information obtained Shan Levans, patient's fiance) [name] confirms patient  account of crack cocaine and alcohol use. He says patient does not have firearms at home and does not have a stockpile of pills. He is not aware of her depressed mood. He does not know why she made the suicidal statement.  Past Psychiatric History:  Previous psych diagnoses:  none Prior inpatient psychiatric treatment:  denies  Social History:  Children: patient has 1 daughter, 1 granddaughter Employment: patient cleans, washes clothes Housing: living with fiance Weapons: does not own firearms  Substance Use History: Patient drinks 3 days of the week, 1 drink per day Spends "a lot" of money on crack cocaine - "$200 a pop" Recreational marijuana use Tobacco use  History of rehab: N/A  Family History:  Psych: none Suicide: none Substance use family hx: "everyone except my dad" The patient's family history includes Cancer in her mother; Glaucoma in her father; Heart disease in her father; Varicose Veins in her paternal grandfather.  Medical History: Past Medical History:  Diagnosis Date   Hypertension     Surgical History: Past Surgical History:  Procedure Laterality Date   CRANIOTOMY Right 10/16/2022   Procedure: RIGHT CRANIECTOMY;  Surgeon: Tressie Stalker, MD;  Location: [facility] OR;  Service: Neurosurgery;  Laterality: Right;    Medications:   Current Facility-Administered Medications:    acetaminophen (TYLENOL) tablet 650 mg, 650 mg, Oral, Q4H PRN, 650 mg at 10/21/22 1000 **OR** acetaminophen (TYLENOL) suppository 650 mg, 650 mg, Rectal, Q4H PRN, Tressie Stalker,  MD   apixaban (ELIQUIS) tablet 5 mg, 5 mg, Oral, BID, Leonie Man, Pramod S, MD, 5 mg at 10/21/22 1343   Chlorhexidine Gluconate Cloth 2 % PADS 6 each, 6 each, Topical, Daily, Newman Pies, MD, 6 each at 10/21/22 1100   docusate sodium (COLACE) capsule 100 mg, 100 mg, Oral, BID, Newman Pies, MD, 100 mg at 10/21/22 1000   escitalopram (LEXAPRO) tablet 5 mg, 5 mg, Oral, Daily, Camelia Phenes, MD   hydrALAZINE  (APRESOLINE) injection 10-40 mg, 10-40 mg, Intravenous, Q4H PRN, Omar Person, NP   labetalol (NORMODYNE) injection 10-40 mg, 10-40 mg, Intravenous, Q4H PRN, Omar Person, NP, 20 mg at 10/16/22 1920   ondansetron (ZOFRAN) tablet 4 mg, 4 mg, Oral, Q4H PRN, 4 mg at 10/20/22 1507 **OR** ondansetron (ZOFRAN) injection 4 mg, 4 mg, Intravenous, Q4H PRN, Newman Pies, MD   Oral care mouth rinse, 15 mL, Mouth Rinse, 4 times per day, Kerney Elbe, MD, 15 mL at 10/21/22 1240   Oral care mouth rinse, 15 mL, Mouth Rinse, PRN, Kerney Elbe, MD   pantoprazole (PROTONIX) EC tablet 40 mg, 40 mg, Oral, QHS, Chen, Lydia D, RPH   polyethylene glycol (MIRALAX / GLYCOLAX) packet 17 g, 17 g, Oral, Daily PRN, Newman Pies, MD   polyethylene glycol (MIRALAX / GLYCOLAX) packet 17 g, 17 g, Oral, Daily, Leonie Man, Pramod S, MD, 17 g at 10/21/22 1000   promethazine (PHENERGAN) tablet 12.5-25 mg, 12.5-25 mg, Oral, Q4H PRN, Newman Pies, MD   sodium chloride flush (NS) 0.9 % injection 10-40 mL, 10-40 mL, Intracatheter, Q12H, Newman Pies, MD, 10 mL at 10/20/22 2116   sodium chloride flush (NS) 0.9 % injection 10-40 mL, 10-40 mL, Intracatheter, PRN, Newman Pies, MD  Allergies: No Known Allergies  Objective  Vital signs:  Temp:  [98.2 F (36.8 C)-98.7 F (37.1 C)] 98.7 F (37.1 C) (01/18 1610) Pulse Rate:  [64-72] 64 (01/18 1610) Resp:  [15-19] 18 (01/18 1610) BP: (119-125)/(69-80) 125/69 (01/18 1610) SpO2:  [96 %-99 %] 99 % (01/18 1610) Weight:  [99.7 kg] 99.7 kg (01/18 0500)  Mental Status Exam:  Appearance and Grooming: Patient is casually dressed in hospital gown . The patient has no noticeable scent or odor.  Behavior: The patient appears in no acute distress, and during the interview, was somnolent, required frequent redirection, and behaving appropriately to scenario. She was able to follow commands and compliant to requests and made minimal eye contact.  The patient did  not appear internally or externally preoccupied.  Attitude: Patient's attitude towards the interviewer was cooperative and open.  Motor activity: There was no notable abnormal facial movements and no notable abnormal extremity movements.  Speech: The volume of her speech was normal and normal in quantity. The rate was normal with a normal rhythm. Responses were normal in latency. She has notable slurred speech.  Mood: "Depressed"  Affect: Patient's affect is dysphoric with restricted range and even fluctuations. Her affect is appropriate for the topic of conversation. ------------------------------------------------------------------------------------------------------------------------- Perception The patient experiences no hallucinations, though she states she talks to herself a lot  Thought Content The patient describes no delusional thoughts.  Patient denies active suicidal intent and denies passive suicidal ideation at present, though did confirm she experienced SI in the morning.  Thought Process The patient's thought process is linear and is goal-directed.  Insight The patient at the time of interview demonstrates good insight, as evidenced by understanding of mental health condition/s, acknowledgement of substance use disorder/s, and ability to identify adaptive  and maladaptive coping strategies.  Judgement The patient over the past 24 hours demonstrates good judgement, as evidenced by help-seeking behavior, such as requesting to start psychotropic medications, seeking placement for rehab, adhering to non-psychotropic medication regimen, and engaging appropriately with staff / other patients.   Assets  Assets:Communication Skills; Desire for Improvement; Financial Resources/Insurance; Housing; Intimacy; Resilience; Social Support; Vocational/Educational   Sleep  Sleep:Sleep: Good  Physical Exam: Physical Exam Vitals and nursing note reviewed.  HENT:     Head:  Normocephalic and atraumatic.  Pulmonary:     Effort: Pulmonary effort is normal.  Neurological:     Mental Status: She is alert and oriented to person, place, and time.     Comments: obvious FNDs s/p stroke but not evaluated by this writer     Blood pressure 125/69, pulse 64, temperature 98.7 F (37.1 C), temperature source Oral, resp. rate 18, height 5\' 2"  (1.575 m), weight 99.7 kg, SpO2 99 %. Body mass index is 40.2 kg/m.

## 2022-10-21 NOTE — Progress Notes (Signed)
ANTICOAGULATION CONSULT NOTE - Follow Up Consult  Pharmacy Consult for heparin > Apixaban  Indication: LLE DVT in setting of stroke   No Known Allergies  Patient Measurements: Height: 5\' 2"  (157.5 cm) Weight: 99.7 kg (219 lb 12.8 oz) IBW/kg (Calculated) : 50.1 Heparin Dosing Weight: 74KG   Vital Signs: BP: 120/80 (01/18 0739)  Labs: Recent Labs    10/19/22 0614 10/20/22 0456 10/20/22 1735 10/21/22 0332 10/21/22 0447  HGB 11.3* 10.8*  --   --  10.9*  HCT 33.9* 33.2*  --   --  30.9*  PLT 180 181  --   --  194  HEPARINUNFRC  --   --  0.55 0.46  --   CREATININE 0.62 0.66  --   --   --     Estimated Creatinine Clearance: 84.6 mL/min (by C-G formula based on SCr of 0.66 mg/dL).   Medical History: Past Medical History:  Diagnosis Date   Hypertension        Assessment: 57 yo W admitted with large CVA with increase in petechial hemorrhage and slight increased mass effect and 79mm midline shift. Also found to have new LLE DVT. No anticoagulation prior to admission. Pharmacy consulted for heparin.    Heparin level delayed due to staffing. Discussed with teams, ok to change to apixaban 5mg  BID with no load.    Goal of Therapy:  Heparin level 0.3-0.5 units/ml Monitor platelets by anticoagulation protocol: Yes   Plan:  Stop heparin Start apixaban 5mg  BID  Monitor for signs/symptoms of bleeding   F/u longterm anticoagulation plan    Benetta Spar, PharmD, BCPS, BCCP Clinical Pharmacist  Please check AMION for all [place] phone numbers After 10:00 PM, call [place] [phone]

## 2022-10-21 NOTE — Progress Notes (Signed)
STROKE TEAM PROGRESS NOTE   INTERVAL HISTORY Patient remains alert and interactive today.   She continues to have right gaze deviation and unable to look to the left past midline.  There is persistent dense left hemiplegia and partial left hemineglect.  Blood pressure is adequately controlled.  Hypertonic saline drip was discontinued this morning.  Head without this morning to my reading appears essentially unchanged but has been read as showing slight increase in mass effect of 2 mm leftward midline shift and slight enlargement of small foci of hemorrhage.  Patient has been found to have DVT involving left popliteal, peroneal and gastrocnemius veins in the left leg.  Discussed with critical care Dr.Olalere who recommends anticoagulation with IV heparin Blood pressure adequately controlled.  Vital signs stable.  Vitals:   10/20/22 1531 10/21/22 0500 10/21/22 0739 10/21/22 1147  BP: (!) 148/96  120/80 119/73  Pulse: 82   72  Resp: (!) 24  15 19   Temp: 98.7 F (37.1 C)   98.2 F (36.8 C)  TempSrc: Oral   Oral  SpO2: 99%  96% 96%  Weight:  99.7 kg    Height:       CBC:  Recent Labs  Lab 10/16/22 0412 10/16/22 0413 10/20/22 0456 10/21/22 0447  WBC 11.3*   < > 10.3 9.6  NEUTROABS 9.1*  --   --   --   HGB 17.1*   < > 10.8* 10.9*  HCT 51.3*   < > 33.2* 30.9*  MCV 89.1   < > 90.5 86.8  PLT 245   < > 181 194   < > = values in this interval not displayed.   Basic Metabolic Panel:  Recent Labs  Lab 10/18/22 0500 10/18/22 1037 10/19/22 0614 10/19/22 0834 10/20/22 0456 10/20/22 0810  NA 147*   < > 147*   < > 141 147*  K 3.3*  --  3.4*  --  3.2*  --   CL 116*  --  112*  --  111  --   CO2 25  --  24  --  24  --   GLUCOSE 120*  --  115*  --  112*  --   BUN 10  --  11  --  14  --   CREATININE 0.73  --  0.62  --  0.66  --   CALCIUM 8.3*  --  8.7*  --  8.4*  --   MG 2.4  --  2.2  --   --   --   PHOS 2.3*  --  2.6  --   --   --    < > = values in this interval not displayed.    Lipid Panel:  Recent Labs  Lab 10/18/22 0500  CHOL 159  TRIG 116  HDL 43  CHOLHDL 3.7  VLDL 23  LDLCALC 93   HgbA1c:  Recent Labs  Lab 10/18/22 0500  HGBA1C 6.0*   Urine Drug Screen:  Recent Labs  Lab 10/16/22 1414  LABOPIA NONE DETECTED  COCAINSCRNUR POSITIVE*  LABBENZ NONE DETECTED  AMPHETMU NONE DETECTED  THCU NONE DETECTED  LABBARB NONE DETECTED    Alcohol Level  Recent Labs  Lab 10/16/22 0412  ETH <10    IMAGING past 24 hours VAS [place] TRANSCRANIAL DOPPLER W BUBBLES  Result Date: 10/21/2022  Transcranial Doppler with Bubble Patient Name:  Sheri Beck  Date of Exam:   10/20/2022 Medical Rec #: [id number]      Accession #:  [id number] Date of Birth: 10-29-1965       Patient Gender: F Patient Age:   4 years Exam Location:  [facility] Procedure:      VAS [place] TRANSCRANIAL DOPPLER W BUBBLES Referring Phys: Elmer Picker --------------------------------------------------------------------------------  Indications: Stroke. Comparison Study: no prior Performing Technologist: [place] Ponder RVS  Examination Guidelines: A complete evaluation includes B-mode imaging, spectral Doppler, color Doppler, and power Doppler as needed of all accessible portions of each vessel. Bilateral testing is considered an integral part of a complete examination. Limited examinations for reoccurring indications may be performed as noted.  Summary: No HITS at rest or during Valsalva. Negative transcranial Doppler Bubble study with no evidence of right to left intracardiac communication.  A vascular evaluation was performed. The left middle cerebral artery was studied. An IV was inserted into the patient's right forearm. Verbal informed consent was obtained.  Negative TCD Bubble study *See table(s) above for TCD measurements and observations.  Diagnosing physician: Delia Heady MD Electronically signed by Delia Heady MD on 10/21/2022 at 8:56:03 AM.    Final     PHYSICAL EXAM  Constitutional:  Appears well-developed and well-nourished.  [name] Cardiovascular: Normal rate and regular rhythm.  Respiratory: snoring, rate/rhythm regular Skull shows right hemicraniectomy surgical defect with bandage surgical clips Neuro: Mental Status: Patient is lying with eyes open., nods appropriately. Follows commands on the right , no verbal output on my exam. Nursing states that she did say her name when asked.  Right gaze preference and unable to look to the left past midline.  Partial left hemineglect.  Speech is clear today with minimal dysarthria. Cranial Nerves: Eyes to the right with forced eye opening. Unable able to bring them midline.Left facial droop. Hearing is intact to voice Head turned to the right Motor: Moves right upper and lower extremity antigravity.  No movement noted on the left.  Sensory: Localizes to painful stimuli on the right. No movement with painful stimuli on the left. States she can not feel light or deep pain on the left but does acknowledge the left side    ASSESSMENT/PLAN Sheri Beck is a 57 y.o. female with history of hypertension presenting with left hemiplegia, depressed level of consciousness and rightward gaze.   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  Patient appears to be a rapid progresser of malignant cerebral edema with significant established edema within few hours of onset Etiology:  large vessel occlusion right   middle cerebral artery etiology cryptogenic. Code Stroke CT head - Large Right MCA infarct. ASPECTS 2. No hemorrhagic transformation, and only minor intracranial mass effect at this time with no midline shift. CTA head & neck right M1 occlusion at its origin. CT Repeat 1/14 shows large right MCA infarct with cytotoxic edema and s/p right hemicraniectomy but no midline shift to the left.  Trace petechial  hemorrhage at the periphery of the infarct. MRI  not done 2D Echo ejection fraction 60 to 65%. LDL 151 HgbA1c 6.3 VTE prophylaxis - scds    Diet   DIET DYS 2 Room service appropriate? No; Fluid consistency: Nectar Thick   No antithrombotic prior to admission, now on IV heparin for DVT left lower extremity.  Therapy recommendations: Inpatient rehab Disposition:  Pending   Brain Compression Hypertonic saline 50cc/hr S/p right frontal temporal parietal decompressive craniectomy with insertion of craniotomy flap into abdomen, JP drain in place  Hypertension Home meds:  Amlodipine 10mg , lisinopril-hydrochlorothiazide  Stable BP goal less than 160 PRN hydralazine and labetalol  Long-term BP goal normotensive  Hyperlipidemia Home meds:  None, resumed in hospital LDL 151, goal < 70 Add statin when no longer NPO  Other Stroke Risk Factors Obesity, Body mass index is 40.2 kg/m., BMI >/= 30 associated with increased stroke risk, recommend weight loss, diet and exercise as appropriate   Other Active Problems DVT left lower extremity  Hospital day # 5  Patient has been transferred to neurology stepdown bed.  She remained stable.  Therapist recommend inpatient rehab and we are waiting on insurance approval.  Plan to change IV heparin drip to Eliquis for her DVT.  Neurosurgery team has requested transfer to stroke team as primary.  No family available at the bedside.  Greater than 50% time during the 35-minute visit was spent in counseling and coordination of care about his stroke, hemiplegia and neglect discussion with care team and nursing questions.     Antony Contras, MD Medical Director [facility] Pager: [phone] 10/21/2022 1:19 PM  To contact Stroke Continuity provider, please refer to [email or web address] After hours, contact General Neurology

## 2022-10-21 NOTE — Progress Notes (Addendum)
Speech Language Pathology Treatment: Dysphagia Patient Details Name: OREE MIRELEZ MRN: [id number] DOB: Aug 23, 1966 Today's Date: 10/21/2022 Time: 5732-2025 SLP Time Calculation (min) (ACUTE ONLY): 13 min  Assessment / Plan / Recommendation Clinical Impression  Pt was seen during breakfast for dysphagia treatment. Pt reported that she has enjoyed the upgraded diet, but that the chicken last night was not chopped finely enough for her to chew/manage easily. Pt tolerated dysphagia 2 boluses, and nectar thick liquids without overt s/s of aspiration. Mastication was mildly prolonged, but functional and lingual residue was cleared with a liquid wash. Pt was educated regarding the results her MBS and the impact of her pharyngeal delay on her tolerance of thin liquids. Pt verbalized understanding regarding her need to reduce intake rate with subsequent trials of thin liquids; and demonstrated understanding by citing the potential ramifications to aspiration. However, pt then proceeded to consume six consecutive swallows despite SLP's verbal prompts and attempts to remove cup/straw. Pt's intake rate was improved with thin liquids via cup, and signs of aspiration were reduced therewith. SLP suspects that pt's impairments in executive function, as it relates to inhibitory control, are contributing to her impulsive behaviors during meals and that these behaviors will likely improve with intervention for cognition. Pt's session was abbreviated to allow her to receive pain meds and be cleaned up by staff. SLP will continue to follow pt.     HPI HPI: Pt is a 57 year old female who presented to the ED on 1/13 due to left-sided weakness and slurred speech. 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 Risk for brain herniation and neurological death due to malignant cerebral edema thought to be high; pt s/p decompressive craniectomy 1/13; JP drain rain removed 1/16. PMH: HTN, prediabetes      SLP Plan  Continue with current  plan of care      Recommendations for follow up therapy are one component of a multi-disciplinary discharge planning process, led by the attending physician.  Recommendations may be updated based on patient status, additional functional criteria and insurance authorization.    Recommendations  Diet recommendations: Dysphagia 2 (fine chop);Nectar-thick liquid Liquids provided via: Cup;Straw Medication Administration: Whole meds with liquid Supervision: Patient able to self feed Compensations: Slow rate;Small sips/bites Postural Changes and/or Swallow Maneuvers: Seated upright 90 degrees                Oral Care Recommendations: Oral care BID Follow Up Recommendations:  (Continued SLP services at level of care recommended by PT/OT) Assistance recommended at discharge: Frequent or constant Supervision/Assistance SLP Visit Diagnosis: Dysphagia, unspecified (R13.10) Plan: Continue with current plan of care         Ivyrose Hashman I. Hardin Negus, [place], [place] Office number [phone]  Horton Marshall  10/21/2022, 9:28 AM

## 2022-10-21 NOTE — TOC Progression Note (Signed)
Transition of Care [facility]) - Progression Note    Patient Details  Name: Sheri Beck MRN: [id number] Date of Birth: 1966-02-15  Transition of Care [facility]) CM/SW [place], LCSW Phone Number: 10/21/2022, 10:04 AM  Clinical Narrative:    Per [facility]'s patient has been approved by insurance. CSW made patient's niece aware; she is getting in touch with patient's daughter to see what time she can complete admission paperwork today.   Expected Discharge Plan: Skilled Nursing Facility Barriers to Discharge: Ship broker, Continued Medical Work up  Expected Discharge Plan and Services In-house Referral: Clinical Social Work   Post Acute Care Choice: [place] Living arrangements for the past 2 months: Apartment                                       Social Determinants of Health (SDOH) Interventions SDOH Screenings   Food Insecurity: No Food Insecurity (10/20/2022)  Housing: Low Risk  (10/20/2022)  Transportation Needs: No Transportation Needs (10/20/2022)  Utilities: Not At Risk (10/20/2022)  Depression (PHQ2-9): Low Risk  (04/08/2022)  Tobacco Use: High Risk (10/17/2022)    Readmission Risk Interventions     No data to display

## 2022-10-22 DIAGNOSIS — I63511 Cerebral infarction due to unspecified occlusion or stenosis of right middle cerebral artery: Secondary | ICD-10-CM | POA: Diagnosis not present

## 2022-10-22 DIAGNOSIS — F05 Delirium due to known physiological condition: Secondary | ICD-10-CM

## 2022-10-22 DIAGNOSIS — I6300 Cerebral infarction due to thrombosis of unspecified precerebral artery: Secondary | ICD-10-CM

## 2022-10-22 DIAGNOSIS — F4321 Adjustment disorder with depressed mood: Secondary | ICD-10-CM | POA: Diagnosis not present

## 2022-10-22 LAB — GLUCOSE, CAPILLARY
Glucose-Capillary: 118 mg/dL — ABNORMAL HIGH (ref 70–99)
Glucose-Capillary: 121 mg/dL — ABNORMAL HIGH (ref 70–99)
Glucose-Capillary: 128 mg/dL — ABNORMAL HIGH (ref 70–99)
Glucose-Capillary: 144 mg/dL — ABNORMAL HIGH (ref 70–99)
Glucose-Capillary: 109 mg/dL — ABNORMAL HIGH (ref 70–99)

## 2022-10-22 LAB — CBC
HCT: 31.5 % — ABNORMAL LOW (ref 36.0–46.0)
Hemoglobin: 10.9 g/dL — ABNORMAL LOW (ref 12.0–15.0)
MCH: 30 pg (ref 26.0–34.0)
MCHC: 34.6 g/dL (ref 30.0–36.0)
MCV: 86.8 fL (ref 80.0–100.0)
Platelets: 236 10*3/uL (ref 150–400)
RBC: 3.63 MIL/uL — ABNORMAL LOW (ref 3.87–5.11)
RDW: 12.9 % (ref 11.5–15.5)
WBC: 9.4 10*3/uL (ref 4.0–10.5)
nRBC: 0.2 % (ref 0.0–0.2)

## 2022-10-22 MED ORDER — POLYETHYLENE GLYCOL 3350 17 G PO PACK: 17.0000 g | PACK | Freq: Every day | ORAL | 0 refills | Status: DC | PRN

## 2022-10-22 MED ORDER — PROMETHAZINE HCL 12.5 MG PO TABS: 12.5000 mg | ORAL_TABLET | ORAL | 0 refills | Status: DC | PRN

## 2022-10-22 MED ORDER — ESCITALOPRAM OXALATE 5 MG PO TABS: 5.0000 mg | ORAL_TABLET | Freq: Every day | ORAL | Status: DC

## 2022-10-22 MED ORDER — ONDANSETRON HCL 4 MG PO TABS: 4.0000 mg | ORAL_TABLET | ORAL | 0 refills | Status: DC | PRN

## 2022-10-22 MED ORDER — APIXABAN 5 MG PO TABS: 5.0000 mg | ORAL_TABLET | Freq: Two times a day (BID) | ORAL | Status: DC

## 2022-10-22 NOTE — Progress Notes (Signed)
   10/22/22 1500  Spiritual Encounters  Type of Visit Initial  Care provided to: Patient  Referral source Patient request  Reason for visit Routine spiritual support  OnCall Visit No  Spiritual Framework  Presenting Themes Goals in life/care  Values/beliefs Christian  Community/Connection Family  Interventions  Spiritual Care Interventions Made Compassionate presence;Reconciliation with self/others  Intervention Outcomes  Outcomes Reduced fear  Spiritual Care Plan  Spiritual Care Issues Still Outstanding No further spiritual care needs at this time (see row info)   Pt wanted prayer for ongoing recovery. [place] prayed with pt. No Further follow-up needed at this time.

## 2022-10-22 NOTE — TOC Progression Note (Signed)
Transition of Care [facility]) - Progression Note    Patient Details  Name: Sheri Beck MRN: [id number] Date of Birth: 05/30/1966  [facility]) CM/SW Contact  Jinger Neighbors, [place] Phone Number: 10/22/2022, 1:23 PM  Clinical Narrative:     CSW sent transport form and discharge summary to [facility] via the Hub. CSW reached out to [facility] and it's reported pt can not admit to facility until family signs intake paperwork. CSW called pt's sister to further discuss and she stated she will have someone go to [facility] by tomorrow, because she has to work.   Expected Discharge Plan: Skilled Nursing Facility Barriers to Discharge: Ship broker, Continued Medical Work up  Expected Discharge Plan and Services In-house Referral: Clinical Social Work   Post Acute Care Choice: [place] Living arrangements for the past 2 months: Apartment Expected Discharge Date: 10/22/22                                     Social Determinants of Health (SDOH) Interventions SDOH Screenings   Food Insecurity: No Food Insecurity (10/20/2022)  Housing: Low Risk  (10/20/2022)  Transportation Needs: No Transportation Needs (10/20/2022)  Utilities: Not At Risk (10/20/2022)  Depression (PHQ2-9): Low Risk  (04/08/2022)  Tobacco Use: High Risk (10/17/2022)    Readmission Risk Interventions     No data to display

## 2022-10-22 NOTE — Progress Notes (Signed)
STROKE TEAM PROGRESS NOTE   INTERVAL HISTORY Patient is sitting up in bed comfortably.  She seems to be in a better mood.  She has been seen by psychiatry and started on escitalopram 5 mg daily for depressed mood.  Patient has a bed in inpatient rehab and would like to be transferred today.  She has been started on Eliquis.  Vital signs stable.  Neurological exam unchanged  Vitals:   10/22/22 0400 10/22/22 0500 10/22/22 1004 10/22/22 1152  BP: 133/78  (!) 149/94 (!) 162/109  Pulse: 76  63 65  Resp: 18  20 20   Temp: 98.9 F (37.2 C)  98.4 F (36.9 C) 98.4 F (36.9 C)  TempSrc: Oral  Oral Oral  SpO2: 99%  99% 98%  Weight:  98.1 kg    Height:       CBC:  Recent Labs  Lab 10/16/22 0412 10/16/22 0413 10/21/22 0447 10/22/22 0429  WBC 11.3*   < > 9.6 9.4  NEUTROABS 9.1*  --   --   --   HGB 17.1*   < > 10.9* 10.9*  HCT 51.3*   < > 30.9* 31.5*  MCV 89.1   < > 86.8 86.8  PLT 245   < > 194 236   < > = values in this interval not displayed.   Basic Metabolic Panel:  Recent Labs  Lab 10/18/22 0500 10/18/22 1037 10/19/22 0614 10/19/22 0834 10/20/22 0456 10/20/22 0810  NA 147*   < > 147*   < > 141 147*  K 3.3*  --  3.4*  --  3.2*  --   CL 116*  --  112*  --  111  --   CO2 25  --  24  --  24  --   GLUCOSE 120*  --  115*  --  112*  --   BUN 10  --  11  --  14  --   CREATININE 0.73  --  0.62  --  0.66  --   CALCIUM 8.3*  --  8.7*  --  8.4*  --   MG 2.4  --  2.2  --   --   --   PHOS 2.3*  --  2.6  --   --   --    < > = values in this interval not displayed.   Lipid Panel:  Recent Labs  Lab 10/18/22 0500  CHOL 159  TRIG 116  HDL 43  CHOLHDL 3.7  VLDL 23  LDLCALC 93   HgbA1c:  Recent Labs  Lab 10/18/22 0500  HGBA1C 6.0*   Urine Drug Screen:  Recent Labs  Lab 10/16/22 1414  LABOPIA NONE DETECTED  COCAINSCRNUR POSITIVE*  LABBENZ NONE DETECTED  AMPHETMU NONE DETECTED  THCU NONE DETECTED  LABBARB NONE DETECTED    Alcohol Level  Recent Labs  Lab  10/16/22 0412  ETH <10    IMAGING past 24 hours No results found.  PHYSICAL EXAM  Constitutional: Appears well-developed and well-nourished.  [name] Cardiovascular: Normal rate and regular rhythm.  Respiratory: snoring, rate/rhythm regular Skull shows right hemicraniectomy surgical defect with bandage surgical clips Neuro: Mental Status: Patient is lying with eyes open., nods appropriately. Follows commands on the right , no verbal output on my exam. Nursing states that she did say her name when asked.  Right gaze preference and unable to look to the left past midline.  Partial left hemineglect.  Speech is clear today with minimal dysarthria. Cranial  Nerves: Eyes to the right with forced eye opening. Unable able to bring them midline.Left facial droop. Hearing is intact to voice Head turned to the right Motor: Moves right upper and lower extremity antigravity.  No movement noted on the left.  Sensory: Localizes to painful stimuli on the right. No movement with painful stimuli on the left. States she can not feel light or deep pain on the left but does acknowledge the left side    ASSESSMENT/PLAN Sheri Beck is a 57 y.o. female with history of hypertension presenting with left hemiplegia, depressed level of consciousness and rightward gaze.   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  Patient appears to be a rapid progresser of malignant cerebral edema with significant established edema within few hours of onset Etiology:  large vessel occlusion right   middle cerebral artery etiology cryptogenic. Code Stroke CT head - Large Right MCA infarct. ASPECTS 2. No hemorrhagic transformation, and only minor intracranial mass effect at this time with no midline shift. CTA head & neck right M1 occlusion at its origin. CT Repeat 1/14 shows large right MCA infarct  with cytotoxic edema and s/p right hemicraniectomy but no midline shift to the left.  Trace petechial hemorrhage at the periphery of the infarct. MRI  not done 2D Echo ejection fraction 60 to 65%. LDL 151 HgbA1c 6.3 VTE prophylaxis - scds    Diet   DIET DYS 2 Room service appropriate? No; Fluid consistency: Nectar Thick   No antithrombotic prior to admission, now on IV heparin for DVT left lower extremity.  Therapy recommendations: Inpatient rehab Disposition:  Pending   Brain Compression Hypertonic saline 50cc/hr S/p right frontal temporal parietal decompressive craniectomy with insertion of craniotomy flap into abdomen, JP drain in place  Hypertension Home meds:  Amlodipine 10mg , lisinopril-hydrochlorothiazide  Stable BP goal less than 160 PRN hydralazine and labetalol  Long-term BP goal normotensive  Hyperlipidemia Home meds:  None, resumed in hospital LDL 151, goal < 70 Add statin when no longer NPO  Other Stroke Risk Factors Obesity, Body mass index is 39.56 kg/m., BMI >/= 30 associated with increased stroke risk, recommend weight loss, diet and exercise as appropriate   Other Active Problems DVT left lower extremity  Hospital day # 6  Patient is neurologically stable to be transferred to inpatient rehab.  She has been seen by psychiatry and started on Lexapro.  Mood appears improved.  Follow-up as an outpatient with stroke clinic with me in 2 months.  Long discussion with patient and family at the bedside and answered questions.     Antony Contras, MD Medical Director [facility] Pager: [phone] 10/22/2022 2:51 PM  To contact Stroke Continuity provider, please refer to [email or web address] After hours, contact General Neurology

## 2022-10-22 NOTE — Plan of Care (Signed)
  Problem: Education: Goal: Knowledge of the prescribed therapeutic regimen will improve Outcome: Progressing   Problem: Clinical Measurements: Goal: Usual level of consciousness will be regained or maintained. Outcome: Progressing Goal: Neurologic status will improve Outcome: Progressing Goal: Ability to maintain intracranial pressure will improve Outcome: Progressing   Problem: Skin Integrity: Goal: Demonstration of wound healing without infection will improve Outcome: Progressing   Problem: Education: Goal: Knowledge of disease or condition will improve Outcome: Progressing Goal: Knowledge of secondary prevention will improve (MUST DOCUMENT ALL) Outcome: Progressing Goal: Knowledge of patient specific risk factors will improve [place] N/A or DELETE if not current risk factor) Outcome: Progressing   Problem: Ischemic Stroke/TIA Tissue Perfusion: Goal: Complications of ischemic stroke/TIA will be minimized Outcome: Progressing   Problem: Coping: Goal: Will verbalize positive feelings about self Outcome: Progressing Goal: Will identify appropriate support needs Outcome: Progressing   Problem: Health Behavior/Discharge Planning: Goal: Ability to manage health-related needs will improve Outcome: Progressing Goal: Goals will be collaboratively established with patient/family Outcome: Progressing   Problem: Self-Care: Goal: Ability to participate in self-care as condition permits will improve Outcome: Progressing Goal: Verbalization of feelings and concerns over difficulty with self-care will improve Outcome: Progressing Goal: Ability to communicate needs accurately will improve Outcome: Progressing   Problem: Nutrition: Goal: Risk of aspiration will decrease Outcome: Progressing Goal: Dietary intake will improve Outcome: Progressing   Problem: Education: Goal: Knowledge of General Education information will improve Description: Including pain rating scale,  medication(s)/side effects and non-pharmacologic comfort measures Outcome: Progressing   Problem: Health Behavior/Discharge Planning: Goal: Ability to manage health-related needs will improve Outcome: Progressing   Problem: Clinical Measurements: Goal: Ability to maintain clinical measurements within normal limits will improve Outcome: Progressing Goal: Will remain free from infection Outcome: Progressing Goal: Diagnostic test results will improve Outcome: Progressing Goal: Respiratory complications will improve Outcome: Progressing Goal: Cardiovascular complication will be avoided Outcome: Progressing   Problem: Activity: Goal: Risk for activity intolerance will decrease Outcome: Progressing   Problem: Nutrition: Goal: Adequate nutrition will be maintained Outcome: Progressing   Problem: Coping: Goal: Level of anxiety will decrease Outcome: Progressing   Problem: Elimination: Goal: Will not experience complications related to bowel motility Outcome: Progressing Goal: Will not experience complications related to urinary retention Outcome: Progressing   Problem: Pain Managment: Goal: General experience of comfort will improve Outcome: Progressing   Problem: Safety: Goal: Ability to remain free from injury will improve Outcome: Progressing   Problem: Skin Integrity: Goal: Risk for impaired skin integrity will decrease Outcome: Progressing   Problem: Health Behavior/Discharge Planning: Goal: Ability to manage health-related needs will improve Outcome: Progressing

## 2022-10-22 NOTE — Plan of Care (Signed)
  Problem: Education: Goal: Knowledge of the prescribed therapeutic regimen will improve Outcome: Progressing   Problem: Clinical Measurements: Goal: Usual level of consciousness will be regained or maintained. Outcome: Progressing Goal: Neurologic status will improve Outcome: Progressing Goal: Ability to maintain intracranial pressure will improve Outcome: Progressing   Problem: Skin Integrity: Goal: Demonstration of wound healing without infection will improve Outcome: Progressing   Problem: Education: Goal: Knowledge of disease or condition will improve Outcome: Progressing Goal: Knowledge of secondary prevention will improve (MUST DOCUMENT ALL) Outcome: Progressing Goal: Knowledge of patient specific risk factors will improve [place] N/A or DELETE if not current risk factor) Outcome: Progressing   Problem: Ischemic Stroke/TIA Tissue Perfusion: Goal: Complications of ischemic stroke/TIA will be minimized Outcome: Progressing   Problem: Coping: Goal: Will verbalize positive feelings about self Outcome: Progressing Goal: Will identify appropriate support needs Outcome: Progressing   Problem: Health Behavior/Discharge Planning: Goal: Ability to manage health-related needs will improve Outcome: Progressing Goal: Goals will be collaboratively established with patient/family Outcome: Progressing   Problem: Self-Care: Goal: Ability to participate in self-care as condition permits will improve Outcome: Progressing Goal: Verbalization of feelings and concerns over difficulty with self-care will improve Outcome: Progressing Goal: Ability to communicate needs accurately will improve Outcome: Progressing   Problem: Nutrition: Goal: Risk of aspiration will decrease Outcome: Progressing Goal: Dietary intake will improve Outcome: Progressing   Problem: Education: Goal: Knowledge of General Education information will improve Description: Including pain rating scale,  medication(s)/side effects and non-pharmacologic comfort measures Outcome: Progressing   Problem: Health Behavior/Discharge Planning: Goal: Ability to manage health-related needs will improve Outcome: Progressing   Problem: Clinical Measurements: Goal: Ability to maintain clinical measurements within normal limits will improve Outcome: Progressing Goal: Will remain free from infection Outcome: Progressing Goal: Diagnostic test results will improve Outcome: Progressing Goal: Respiratory complications will improve Outcome: Progressing Goal: Cardiovascular complication will be avoided Outcome: Progressing   Problem: Activity: Goal: Risk for activity intolerance will decrease Outcome: Progressing   Problem: Nutrition: Goal: Adequate nutrition will be maintained Outcome: Progressing   Problem: Coping: Goal: Level of anxiety will decrease Outcome: Progressing   Problem: Elimination: Goal: Will not experience complications related to bowel motility Outcome: Progressing Goal: Will not experience complications related to urinary retention Outcome: Progressing   Problem: Pain Managment: Goal: General experience of comfort will improve Outcome: Progressing   Problem: Safety: Goal: Ability to remain free from injury will improve Outcome: Progressing   Problem: Skin Integrity: Goal: Risk for impaired skin integrity will decrease Outcome: Progressing   Problem: Health Behavior/Discharge Planning: Goal: Ability to manage health-related needs will improve Outcome: Progressing   Problem: Clinical Measurements: Goal: Ability to maintain clinical measurements within normal limits will improve Outcome: Progressing   Problem: Pain Managment: Goal: General experience of comfort will improve Outcome: Progressing   Problem: Safety: Goal: Ability to remain free from injury will improve Outcome: Progressing   Problem: Skin Integrity: Goal: Risk for impaired skin integrity will  decrease Outcome: Progressing   Problem: Safety: Goal: Ability to disclose and discuss suicidal ideas will improve Outcome: Progressing Goal: Ability to identify and utilize support systems that promote safety will improve Outcome: Progressing

## 2022-10-22 NOTE — Discharge Summary (Signed)
Physician Discharge Summary     Providing Compassionate, Quality Care - Together   Patient ID: Sheri Beck MRN: [id number] DOB/AGE: 04-19-1966 57 y.o.  Admit date: 10/16/2022 Discharge date: 10/22/2022  Admission Diagnoses: Stroke  Discharge Diagnoses:  Principal Problem:   Stroke 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 She underwent a craniectomy for decompression. She is hemiparetic on the left. She developed multiple DVTs and was transitioned from a heparin drip to Eliquis. She expressed feelings of depression and was evaluated by psychiatry, who have started the patient on Lexapro. They feel the patient is ready to discharge with outpatient psychiatry follow up. She has worked with both physical and occupational therapies who feel the patient is ready for discharge to a skilled nursing facility for further rehabilitation. She is tolerating a dysphagia 2 diet with nectar thick liquids. She is medically ready for discharge at this time.   Consults: neurology, rehabilitation medicine, and psychiatry  Significant Diagnostic Studies: radiology: VAS [place] TRANSCRANIAL DOPPLER W BUBBLES  Result Date: 10/21/2022  Transcranial Doppler with Bubble Patient Name:  NAVEYAH IACOVELLI Brockwell  Date of Exam:   10/20/2022 Medical Rec #: [id number]      Accession #:    [id number] Date of Birth: 07/09/1966       Patient Gender: F Patient Age:   57 years Exam Location:  [facility] Procedure:      VAS [place] TRANSCRANIAL DOPPLER W BUBBLES Referring Phys: Elmer Picker  --------------------------------------------------------------------------------  Indications: Stroke. Comparison Study: no prior Performing Technologist: Argentina Ponder RVS  Examination Guidelines: A complete evaluation includes B-mode imaging, spectral Doppler, color Doppler, and power Doppler as needed of all accessible portions of each vessel. Bilateral testing is considered an integral part of a complete examination. Limited examinations for reoccurring indications may be performed as noted.  Summary: No HITS at rest or during Valsalva. Negative transcranial Doppler Bubble study with no evidence of right to left intracardiac communication.  A vascular evaluation was performed. The left middle cerebral artery was studied. An IV was inserted into the patient's right forearm. Verbal informed consent was obtained.  Negative TCD Bubble study *See table(s) above for TCD measurements and observations.  Diagnosing physician: Delia Heady MD Electronically signed by Delia Heady MD on 10/21/2022 at 8:56:03 AM.    Final    CT HEAD WO CONTRAST ( )  Result Date: 10/20/2022 CLINICAL DATA:  Stroke follow-up. EXAM: CT HEAD WITHOUT CONTRAST TECHNIQUE: Contiguous axial images were obtained from the base of the skull through the vertex without intravenous contrast. RADIATION DOSE REDUCTION: This exam was performed according to the departmental dose-optimization program which includes automated exposure control, adjustment of the mA and/or kV according to patient size and/or use of iterative reconstruction technique. COMPARISON:  Head CT 10/17/2022 FINDINGS: Brain: A large infarct is again noted involving the entirety of the right MCA territory. Small foci of hemorrhage within the infarct have mildly increased in size, with the largest measuring 1.1 cm in the high right frontal lobe. Extensive cytotoxic edema is again noted with slightly increased mass effect  including slightly increased effacement of the right lateral  ventricle and 2 mm of new leftward midline shift. There is no evidence of ventricular entrapment, new infarct, or extra-axial fluid collection. The basilar cisterns are slightly effaced without tonsillar herniation. Age advanced chronic small vessel ischemia is again noted in the left cerebral hemispheric white matter, and there is a small chronic right cerebellar infarct. Vascular: Persistent hyperdense right MCA. Skull: Right-sided decompressive craniectomy with overlying soft tissue swelling and skin staples. Sinuses/Orbits: Unchanged partial opacification of a posterior right ethmoid air cell. Visualized mastoid air cells are clear. Bilateral proptosis. Other: None. IMPRESSION: Evolving large right MCA infarct with mild enlargement of small foci of hemorrhage and slightly increased mass effect, now with 2 mm of leftward midline shift. Electronically Signed   By: Logan Bores M.D.   On: 10/20/2022 10:36   VAS [place] LOWER EXTREMITY VENOUS (DVT)  Result Date: 10/20/2022  Lower Venous DVT Study Patient Name:  LESSA HUGE Elem  Date of Exam:   10/19/2022 Medical Rec #: [id number]      Accession #:    [id number] Date of Birth: 09-26-1966       Patient Gender: F Patient Age:   57 years Exam Location:  [facility] Procedure:      VAS [place] LOWER EXTREMITY VENOUS (DVT) Referring Phys: Elwin Sleight DE LA TORRE --------------------------------------------------------------------------------  Indications: Stroke.  Comparison Study: No prior studies. Performing Technologist: Darlin Coco RDMS, RVT  Examination Guidelines: A complete evaluation includes B-mode imaging, spectral Doppler, color Doppler, and power Doppler as needed of all accessible portions of each vessel. Bilateral testing is considered an integral part of a complete examination. Limited examinations for reoccurring indications may be performed as noted. The reflux portion of the exam is performed with the patient in reverse Trendelenburg.   +---------+---------------+---------+-----------+----------+--------------+ RIGHT    CompressibilityPhasicitySpontaneityPropertiesThrombus Aging +---------+---------------+---------+-----------+----------+--------------+ CFV      Full           Yes      Yes                                 +---------+---------------+---------+-----------+----------+--------------+ SFJ      Full                                                        +---------+---------------+---------+-----------+----------+--------------+ FV Prox  Full                                                        +---------+---------------+---------+-----------+----------+--------------+ FV Mid   Full                                                        +---------+---------------+---------+-----------+----------+--------------+ FV DistalFull                                                        +---------+---------------+---------+-----------+----------+--------------+  PFV      Full                                                        +---------+---------------+---------+-----------+----------+--------------+ POP      Full           Yes      Yes                                 +---------+---------------+---------+-----------+----------+--------------+ PTV      Full                                                        +---------+---------------+---------+-----------+----------+--------------+ PERO     Full                                                        +---------+---------------+---------+-----------+----------+--------------+   +---------+---------------+---------+-----------+----------+--------------+ LEFT     CompressibilityPhasicitySpontaneityPropertiesThrombus Aging +---------+---------------+---------+-----------+----------+--------------+ CFV      Full           Yes      Yes                                  +---------+---------------+---------+-----------+----------+--------------+ SFJ      Full                                                        +---------+---------------+---------+-----------+----------+--------------+ FV Prox  Full                                                        +---------+---------------+---------+-----------+----------+--------------+ FV Mid   Full                                                        +---------+---------------+---------+-----------+----------+--------------+ FV DistalFull                                                        +---------+---------------+---------+-----------+----------+--------------+ PFV      Full                                                        +---------+---------------+---------+-----------+----------+--------------+   POP      Full           Yes      Yes                                 +---------+---------------+---------+-----------+----------+--------------+ PTV      Partial        Yes      Yes                  Acute          +---------+---------------+---------+-----------+----------+--------------+ PERO     Partial        Yes      Yes                  Acute          +---------+---------------+---------+-----------+----------+--------------+ Gastroc  None           No       No                   Acute          +---------+---------------+---------+-----------+----------+--------------+     Summary: RIGHT: - There is no evidence of deep vein thrombosis in the lower extremity.  - No cystic structure found in the popliteal fossa.  LEFT: - Findings consistent with acute deep vein thrombosis involving the left gastrocnemius veins, left posterior tibial veins, and left peroneal veins. - No cystic structure found in the popliteal fossa.  *See table(s) above for measurements and observations. Electronically signed by Deitra Mayo MD on 10/20/2022 at 6:56:36 AM.    Final    DG  Swallowing Func-Speech Pathology  Result Date: 10/18/2022 Table formatting from the original result was not included. Objective Swallowing Evaluation: Type of Study: MBS-Modified Barium Swallow Study  Patient Details Name: ALANE HANSSEN MRN: [id number] Date of Birth: 10-13-1965 Today's Date: 10/18/2022 Time: SLP Start Time (ACUTE ONLY): 1445 -SLP Stop Time (ACUTE ONLY): 1500 SLP Time Calculation (min) (ACUTE ONLY): 15 min Past Medical History: Past Medical History: Diagnosis Date  Hypertension  Past Surgical History: Past Surgical History: Procedure Laterality Date  CRANIOTOMY Right 10/16/2022  Procedure: RIGHT CRANIECTOMY;  Surgeon: Newman Pies, MD;  Location: [place];  Service: Neurosurgery;  Laterality: Right; HPI: Pt is a 57 year old female who presented to the ED on 1/13 due to left-sided weakness and slurred speech. 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 Risk for brain herniation and neurological death due to malignant cerebral edema thought to be high; pt s/p emergency right hemicraniectomy 1/13. PMH: HTN, prediabetes  No data recorded  Recommendations for follow up therapy are one component of a multi-disciplinary discharge planning process, led by the attending physician.  Recommendations may be updated based on patient status, additional functional criteria and insurance authorization. Assessment / Plan / Recommendation   10/18/2022   3:04 PM Clinical Impressions Clinical Impression Visualization during the study was challenging due to pt's frequent superior movement of her right arm and her right clavicle intermittently obscuring the view of the larynx. Pt presents with oropharyngeal dysphagia characterized by reduced labial seal, weak lingual manipulation, impaired mastication, reduced bolus cohesion, reduced posterior bolus propulsion, and a pharyngeal delay. She demonstrated left-sided anterior spillage, difficulty with A-P transport of solids, a munching masticatory pattern, premature spillage to the  valleculae and pyriform sinuses, and the swallow was often triggered with the head of liquid boluses in the hypopharynx. Penetration (PAS  3) and aspiration (PAS 7) were noted with thin liquids secondary to the pharyngeal delay. Aspiration triggered throat clearing and a delayed cough. Laryngeal invasion was improved to PAS 5 with use of a chin tuck. Penetration and aspiration were eliminated with use of individual boluses, but pt exhibited significant difficulty consistently using this strategy. With prompts, she often reduced the number of consecutive swallows to three and aspiration of penetrated material was then noted by the second/third consecutive swallow. A dysphagia 2 diet with nectar thick liquid is recommended at this time with observance of swallowing precautions. Prognosis for advancement is judged to be good with intervention; SLP will continue to follow pt. SLP Visit Diagnosis Dysphagia, oropharyngeal phase (R13.12) Impact on safety and function Mild aspiration risk     10/18/2022   3:04 PM Treatment Recommendations Treatment Recommendations Therapy as outlined in treatment plan below     10/18/2022   3:04 PM Prognosis Prognosis for Safe Diet Advancement Good Barriers to Reach Goals Cognitive deficits;Severity of deficits   10/18/2022   3:04 PM Diet Recommendations SLP Diet Recommendations Dysphagia 2 (Fine chop) solids;Nectar thick liquid Liquid Administration via Cup;Straw Medication Administration Whole meds with liquid Compensations Slow rate;Small sips/bites;Follow solids with liquid;Monitor for anterior loss Postural Changes Seated upright at 90 degrees     10/18/2022   3:04 PM Other Recommendations Other Recommendations Order thickener from pharmacy Follow Up Recommendations Acute inpatient rehab (3hours/day) Functional Status Assessment Patient has had a recent decline in their functional status and demonstrates the ability to make significant improvements in function in a reasonable and  predictable amount of time.   10/18/2022   3:04 PM Frequency and Duration  Speech Therapy Frequency (ACUTE ONLY) min 2x/week Treatment Duration 2 weeks     10/18/2022   3:04 PM Oral Phase Oral Phase Impaired Oral - Nectar Cup Left anterior bolus loss;Weak lingual manipulation;Reduced posterior propulsion;Decreased bolus cohesion;Premature spillage Oral - Nectar Straw Left anterior bolus loss;Weak lingual manipulation;Reduced posterior propulsion;Decreased bolus cohesion;Premature spillage Oral - Thin Cup Left anterior bolus loss;Weak lingual manipulation;Reduced posterior propulsion;Decreased bolus cohesion;Premature spillage Oral - Thin Straw Left anterior bolus loss;Weak lingual manipulation;Reduced posterior propulsion;Decreased bolus cohesion;Premature spillage Oral - Puree Left anterior bolus loss;Weak lingual manipulation;Reduced posterior propulsion;Decreased bolus cohesion;Premature spillage Oral - Mech Soft Left anterior bolus loss;Weak lingual manipulation;Reduced posterior propulsion;Decreased bolus cohesion;Premature spillage;Impaired mastication    10/18/2022   3:04 PM Pharyngeal Phase Pharyngeal Phase Impaired Pharyngeal- Nectar Cup Delayed swallow initiation-vallecula;Pharyngeal residue - pyriform Pharyngeal- Nectar Straw Delayed swallow initiation-vallecula;Pharyngeal residue - pyriform Pharyngeal- Thin Cup Delayed swallow initiation-vallecula;Pharyngeal residue - pyriform;Delayed swallow initiation-pyriform sinuses;Penetration/Aspiration before swallow;Penetration/Apiration after swallow Pharyngeal Material enters airway, remains ABOVE vocal cords and not ejected out;Material enters airway, CONTACTS cords and not ejected out;Material enters airway, passes BELOW cords and not ejected out despite cough attempt by patient Pharyngeal- Thin Straw Delayed swallow initiation-vallecula;Pharyngeal residue - pyriform;Delayed swallow initiation-pyriform sinuses;Penetration/Aspiration before  swallow;Penetration/Apiration after swallow Pharyngeal Material enters airway, remains ABOVE vocal cords and not ejected out;Material enters airway, passes BELOW cords and not ejected out despite cough attempt by patient Pharyngeal- Puree Delayed swallow initiation-vallecula;Pharyngeal residue - pyriform Pharyngeal- Mechanical Soft Delayed swallow initiation-vallecula;Pharyngeal residue - pyriform    10/18/2022   3:04 PM Cervical Esophageal Phase  Cervical Esophageal Phase [facility] Shanika I. Vear ClockPhillips, MS, CCC-SLP Acute Rehabilitation Services Office number [phone] Scheryl MartenShanika I Phillips 10/18/2022, 4:05 PM                     ECHOCARDIOGRAM COMPLETE BUBBLE STUDY  Result Date: 10/18/2022    ECHOCARDIOGRAM REPORT   Patient Name:   JOSSELYN HARKINS Kawa Date of Exam: 10/18/2022 Medical Rec #:  [id number]     Height:       62.0 in Accession #:    [id number]    Weight:       217.0 lb Date of Birth:  05/04/1966      BSA:          1.979 m Patient Age:    56 years      BP:           121/76 mmHg Patient Gender: F             HR:           61 bpm. Exam Location:  Inpatient Procedure: 2D Echo, Cardiac Doppler, Color Doppler and Saline Contrast Bubble            Study Indications:    stroke  History:        Patient has no prior history of Echocardiogram examinations.                 Risk Factors:Hypertension.  Sonographer:    Mike Gip Referring Phys: [id number] Tobey Grim IMPRESSIONS  1. Left ventricular ejection fraction, by estimation, is 60 to 65%. The left ventricle has normal function. The left ventricle has no regional wall motion abnormalities. Left ventricular diastolic parameters were normal.  2. Right ventricular systolic function is normal. The right ventricular size is normal. There is normal pulmonary artery systolic pressure.  3. The mitral valve is abnormal. Mild mitral valve regurgitation. No evidence of mitral stenosis.  4. The aortic valve is tricuspid. There is mild calcification of the aortic valve. There is  mild thickening of the aortic valve. Aortic valve regurgitation is not visualized. Aortic valve sclerosis is present, with no evidence of aortic valve stenosis.  5. The inferior vena cava is normal in size with greater than 50% respiratory variability, suggesting right atrial pressure of 3 mmHg.  6. Agitated saline contrast bubble study was negative, with no evidence of any interatrial shunt. FINDINGS  Left Ventricle: Left ventricular ejection fraction, by estimation, is 60 to 65%. The left ventricle has normal function. The left ventricle has no regional wall motion abnormalities. The left ventricular internal cavity size was normal in size. There is  no left ventricular hypertrophy. Left ventricular diastolic parameters were normal. Right Ventricle: The right ventricular size is normal. No increase in right ventricular wall thickness. Right ventricular systolic function is normal. There is normal pulmonary artery systolic pressure. The tricuspid regurgitant velocity is 2.62 m/s, and  with an assumed right atrial pressure of 3 mmHg, the estimated right ventricular systolic pressure is 30.5 mmHg. Left Atrium: Left atrial size was normal in size. Right Atrium: Right atrial size was normal in size. Pericardium: There is no evidence of pericardial effusion. Mitral Valve: The mitral valve is abnormal. There is mild thickening of the mitral valve leaflet(s). Mild mitral valve regurgitation. No evidence of mitral valve stenosis. Tricuspid Valve: The tricuspid valve is normal in structure. Tricuspid valve regurgitation is mild . No evidence of tricuspid stenosis. Aortic Valve: The aortic valve is tricuspid. There is mild calcification of the aortic valve. There is mild thickening of the aortic valve. Aortic valve regurgitation is not visualized. Aortic valve sclerosis is present, with no evidence of aortic valve stenosis. Pulmonic Valve: The pulmonic valve was normal in structure. Pulmonic valve regurgitation is not  visualized.  No evidence of pulmonic stenosis. Aorta: The aortic root is normal in size and structure. Venous: The inferior vena cava is normal in size with greater than 50% respiratory variability, suggesting right atrial pressure of 3 mmHg. IAS/Shunts: No atrial level shunt detected by color flow Doppler. Agitated saline contrast was given intravenously to evaluate for intracardiac shunting. Agitated saline contrast bubble study was negative, with no evidence of any interatrial shunt.  LEFT VENTRICLE PLAX 2D LVIDd:         4.70 cm     Diastology LVIDs:         2.80 cm     LV e' medial:    7.62 cm/s LV PW:         0.70 cm     LV E/e' medial:  13.0 LV IVS:        1.00 cm     LV e' lateral:   15.20 cm/s LVOT diam:     1.80 cm     LV E/e' lateral: 6.5 LV SV:         61 LV SV Index:   31 LVOT Area:     2.54 cm  LV Volumes (MOD) LV vol d, MOD A2C: 97.0 ml LV vol d, MOD A4C: 97.3 ml LV vol s, MOD A2C: 35.2 ml LV vol s, MOD A4C: 38.8 ml LV SV MOD A2C:     61.8 ml LV SV MOD A4C:     97.3 ml LV SV MOD BP:      59.2 ml RIGHT VENTRICLE          IVC RV Basal diam:  3.70 cm  IVC diam: 1.60 cm TAPSE (M-mode): 2.2 cm LEFT ATRIUM             Index        RIGHT ATRIUM           Index LA diam:        3.20 cm 1.62 cm/m   RA Area:     12.40 cm LA Vol (A2C):   37.8 ml 19.10 ml/m  RA Volume:   24.70 ml  12.48 ml/m LA Vol (A4C):   36.3 ml 18.34 ml/m LA Biplane Vol: 38.0 ml 19.20 ml/m  AORTIC VALVE LVOT Vmax:   142.00 cm/s LVOT Vmean:  88.500 cm/s LVOT VTI:    0.238 m  AORTA Ao Root diam: 2.90 cm Ao Asc diam:  2.90 cm MITRAL VALVE                TRICUSPID VALVE MV Area (PHT): 5.27 cm     TR Peak grad:   27.5 mmHg MV Decel Time: 144 msec     TR Vmax:        262.00 cm/s MV E velocity: 99.10 cm/s MV A velocity: 111.00 cm/s  SHUNTS MV E/A ratio:  0.89         Systemic VTI:  0.24 m                             Systemic Diam: 1.80 cm Peter Nishan MD Electronically signed by Peter Nishan MD Signature Date/Time: 10/18/2022/9:59:08 AM     Final    US EKG SITE RITE  Result Date: 10/17/2022 If Site Rite image not attached, placement could not be confirmed due to current cardiac rhythm.  [facility]AST [phone] Date: 10/17/2022 CLINICAL DATA:  Neurologic change. EXAM: CT HEAD WITHOUT CONTRAST TECHNIQUE:  Contiguous axial images were obtained from the base of the skull through the vertex without intravenous contrast. RADIATION DOSE REDUCTION: This exam was performed according to the departmental dose-optimization program which includes automated exposure control, adjustment of the mA and/or kV according to patient size and/or use of iterative reconstruction technique. COMPARISON:  CT head without contrast 10/17/2022 at 4:35 a.m. FINDINGS: Brain: Large right MCA territory infarct is again noted. Patient is status post craniectomy. Areas of petechial hemorrhage near the superior aspect of the infarct are again seen. No larger hemorrhagic transformation is evident. Mass effect is increasing with further effacement of the right lateral ventricle. Although there is no leftward midline shift, the midline was previously to the right of midline after the craniectomy. It is now at the midline consistent with increased mass effect. The basal cisterns are less patent than on the prior exam. Left hemisphere is unchanged. Progressive effacement of sulci and noted along the vermis. No downward herniation is present. CSF space anterior to the pons is preserved. Fourth ventricle is stable in size. Vascular: Hyperdense right MCA again noted. Skull: Right craniectomy. Similar appearance of edematous changes in the scalp overlying the craniectomy. Sinuses/Orbits: The paranasal sinuses and mastoid air cells are clear. Stable exophthalmos. No acute abnormality. IMPRESSION: 1. Evolving large right MCA territory infarct with petechial hemorrhage. No larger hemorrhagic transformation. 2. Increased mass effect with further effacement of the right lateral  ventricle. Although there is no leftward midline shift, the midline was previously to the right of midline after the craniectomy. It is now at the midline consistent with increased mass effect. The basal cisterns are less patent than on the prior exam. The above was relayed via text pager to Dr. Elmer Picker on 10/17/2022 at 14:33. Electronically Signed   By: Marin Roberts M.D.   On: 10/17/2022 14:33   CT ANGIO HEAD NECK W WO CM  Addendum Date: 10/17/2022   ADDENDUM REPORT: 10/17/2022 06:16 ADDENDUM: Study discussed by telephone with Dr. Otelia Limes at 6:16 am on 10/17/2022. Electronically Signed   By: Odessa Fleming M.D.   On: 10/17/2022 06:16   Result Date: 10/17/2022 CLINICAL DATA:  57 year old female code stroke presentation yesterday morning. Large right MCA infarct. Postoperative day 1 decompressive craniotomy for right MCA brain edema. EXAM: CT ANGIOGRAPHY HEAD AND NECK TECHNIQUE: Multidetector CT imaging of the head and neck was performed using the standard protocol during bolus administration of intravenous contrast. Multiplanar CT image reconstructions and MIPs were obtained to evaluate the vascular anatomy. Carotid stenosis measurements (when applicable) are obtained utilizing NASCET criteria, using the distal internal carotid diameter as the denominator. RADIATION DOSE REDUCTION: This exam was performed according to the departmental dose-optimization program which includes automated exposure control, adjustment of the mA and/or kV according to patient size and/or use of iterative reconstruction technique. CONTRAST:  22mL OMNIPAQUE IOHEXOL 350 MG/ML SOLN COMPARISON:  Presentation head CT 10/16/2022. 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 Interval decompressive craniectomy with mild brain herniation through the skull defect (series 15, image 19). Increased mass effect on the right lateral ventricle, especially the frontal horn, but there  is no leftward midline shift. Basilar cisterns remain patent. Hyperdense right MCA and its branches. There are scattered small foci of petechial hemorrhage along the periphery of the infarcted right hemisphere (series 15, images 21, 22, 23). No malignant hemorrhagic transformation. Elsewhere gray-white differentiation is stable. Small chronic right cerebellar infarct. Calvarium and skull base: New long segment right  craniectomy. Paranasal sinuses: Visualized paranasal sinuses and mastoids are stable and well aerated. Orbits: Postoperative changes to the right scalp. Postoperative scalp drain in place. Overlying skin staples. Orbits soft tissues are stable, negative. CTA NECK Skeleton: Right craniectomy described above. No other No acute osseous abnormality identified. Upper chest: Negative, lung apices are fairly clear. Other neck: Pharynx motion artifact. No other acute finding in the neck. Aortic arch: 3 vessel arch configuration with minimal distal arch atherosclerosis. Right carotid system: Mildly tortuous brachiocephalic artery and right CCA origin. Motion artifact at the right carotid bifurcation. But no significant plaque or stenosis identified. Left carotid system: Similar mild tortuosity and left carotid bifurcation motion artifact but no plaque or stenosis is identified. Vertebral arteries: Proximal subclavian arteries and vertebral artery origins are normal. Codominant vertebral arteries with tortuous V1 segments, but no vertebral plaque or stenosis to the skull base. CTA HEAD Posterior circulation: Distal vertebral arteries and vertebrobasilar junction are normal. Normal left PICA origin and dominant appearing right AICA origin. Patent basilar artery without stenosis. Patent SCA and PCA origins. Posterior communicating arteries are diminutive or absent. Bilateral PCA branches are within normal limits. Anterior circulation: Both ICA siphons are patent. The left siphon appears normal to the carotid  terminus. The right ICA siphon caliber is mildly diminished throughout. But there is no significant siphon plaque or stenosis. Right ICA terminus and right ACA origin remain patent but the MCA origin is occluded. There is minimal right MCA collateral enhancement. Contralateral left ICA terminus, left MCA and ACA origin are normal. A1 segments, anterior communicating artery, and bilateral ACA branches are within normal limits. Left MCA M1 segment and bifurcation are patent without stenosis. Left MCA branches are within normal limits. Venous sinuses: Early contrast timing. The superior sagittal sinus, transverse and sigmoid sinuses appear grossly patent. Anatomic variants: None. Review of the MIP images confirms the above findings IMPRESSION: 1. Large Right MCA infarct with progressive cytotoxic edema, but no leftward midline shift following decompressive craniectomy. Basilar cisterns remain patent. 2. Mild petechial hemorrhage along the periphery of the infarct. But no malignant hemorrhagic transformation. 3. Confluent occlusion of the Right MCA at its origin. Right ICA terminus and Right ACA remain patent. And virtually NO atherosclerosis in the head or neck. Given other evidence of chronic cerebral ischemia (right cerebellum), consider the possibility of a PFO or similar thromboembolic mechanism. Salient findings were communicated to Dr. Otelia LimesLindzen at 5:47 am on 10/17/2022 by text page via the [facility] messaging system. Electronically Signed: By: Odessa FlemingH  Hall M.D. On: 10/17/2022 05:50   CT HEAD CODE STROKE WO CONTRAST  Result Date: 10/16/2022 CLINICAL DATA:  Code stroke. 57 year old female with left side deficits. EXAM: CT HEAD WITHOUT CONTRAST TECHNIQUE: Contiguous axial images were obtained from the base of the skull through the vertex without intravenous contrast. RADIATION DOSE REDUCTION: This exam was performed according to the departmental dose-optimization program which includes automated exposure control,  adjustment of the mA and/or kV according to patient size and/or use of iterative reconstruction technique. COMPARISON:  None Available. FINDINGS: Brain: Extremely hyperdense right MCA M1 segment on series 2, image 11. And cytotoxic edema with loss of normal gray-white differentiation throughout most of the right MCA territory. ASPECTS 2. Partially effaced right lateral ventricle but no midline shift. No hemorrhagic transformation identified. Basilar cisterns remain patent. Left hemisphere gray-white differentiation appears maintained. There is patchy bilateral periventricular white matter hypodensity. Chronic appearing small right lateral cerebellar infarct. Other posterior fossa gray-white differentiation within normal limits. Vascular: Faint  Calcified atherosclerosis at the skull base. Hyperdense right MCA M1 beginning just distal to its origin. Skull: No acute osseous abnormality identified. Sinuses/Orbits: Visualized paranasal sinuses and mastoids are clear. Other: Mildly Disconjugate gaze.  Negative scalp. ASPECTS [facility] Stroke Program Early CT Score) - Ganglionic level infarction (caudate, lentiform nuclei, internal capsule, insula, M1-M3 cortex): 1 - Supraganglionic infarction (M4-M6 cortex): 1 Total score (0-10 with 10 being normal): 2 IMPRESSION: 1. Large Right MCA infarct. ASPECTS 2. No hemorrhagic transformation, and only minor intracranial mass effect at this time with no midline shift. 2. Underlying bilateral white matter disease and chronic appearing right cerebellar infarct. 3. These results were communicated to Dr. Otelia Limes at 4:26 am on 10/16/2022 by text page via the [facility] messaging system. Electronically Signed   By: Odessa Fleming M.D.   On: 10/16/2022 04:27   MM 3D SCREEN BREAST BILATERAL  Result Date: 10/06/2022 CLINICAL DATA:  Screening. EXAM: DIGITAL SCREENING BILATERAL MAMMOGRAM WITH TOMOSYNTHESIS AND CAD TECHNIQUE: Bilateral screening digital craniocaudal and mediolateral oblique mammograms  were obtained. Bilateral screening digital breast tomosynthesis was performed. The images were evaluated with computer-aided detection. COMPARISON:  Previous exam(s). ACR Breast Density Category b: There are scattered areas of fibroglandular density. FINDINGS: There are no findings suspicious for malignancy. IMPRESSION: No mammographic evidence of malignancy. A result letter of this screening mammogram will be mailed directly to the patient. RECOMMENDATION: Screening mammogram in one year. (Code:SM-B-01Y) BI-RADS CATEGORY  1: Negative. Electronically Signed   By: Edwin Cap M.D.   On: 10/06/2022 08:03     Treatments: surgery: Right frontal temporal parietal decompressive craniectomy; insertion of craniotomy flap into the abdominal subcutaneous tissue   Discharge Exam: Blood pressure (!) 162/109, pulse 65, temperature 98.4 F (36.9 C), temperature source Oral, resp. rate 20, height 5\' 2"  (1.575 m), weight 98.1 kg, SpO2 98 %.  Alert and oriented x 3 Speech clear Right gaze preference Left hemiplegia RUE, RLE strength/sensation intact  Disposition: Discharge disposition: 03-Skilled Nursing Facility        Allergies as of 10/22/2022   No Known Allergies      Medication List     STOP taking these medications    aspirin EC 325 MG tablet   naproxen 375 MG tablet Commonly known as: NAPROSYN       TAKE these medications    amLODipine 10 MG tablet Commonly known as: NORVASC Take 1 tablet by mouth once daily   apixaban 5 MG Tabs tablet Commonly known as: ELIQUIS Take 1 tablet (5 mg total) by mouth 2 (two) times daily.   escitalopram 5 MG tablet Commonly known as: LEXAPRO Take 1 tablet (5 mg total) by mouth daily. Start taking on: October 23, 2022   lisinopril-hydrochlorothiazide 20-12.5 MG tablet Commonly known as: ZESTORETIC Take 1 tablet by mouth once daily   ondansetron 4 MG tablet Commonly known as: ZOFRAN Take 1 tablet (4 mg total) by mouth every 4 (four)  hours as needed for nausea or vomiting.   polyethylene glycol 17 g packet Commonly known as: MIRALAX / GLYCOLAX Take 17 g by mouth daily as needed for moderate constipation.   promethazine 12.5 MG tablet Commonly known as: PHENERGAN Take 1-2 tablets (12.5-25 mg total) by mouth every 4 (four) hours as needed for refractory nausea / vomiting.        Contact information for follow-up providers     11-03-1986, MD Follow up.   Specialty: Neurosurgery Why: As needed Contact information: [address] [place]  [id number] [phone]              Contact information for after-discharge care     Destination     [facility] Preferred SNF .   Service: Skilled Nursing Contact information: [address] [place] [id number] [phone]                     Signed: Val EagleMeghan Izzak Fries, DNP, AGNP-C Nurse Practitioner  [facility] [address], [place], [place] [address] P: [phone]    F: [phone]  10/22/2022, 12:57 PM

## 2022-10-22 NOTE — Consult Note (Addendum)
[facility] Psychiatry New Face-to-Face Psychiatric Evaluation  Service Date: October 22, 2022 LOS:  LOS: 6 days   Assessment  Sheri Beck is a 57 y.o. female admitted medically for 10/16/2022  4:04 AM for MCA stroke. She carries the psychiatric diagnoses of stimulant (crack cocaine) use disorder and has a past medical history of HTN . Consult / Liaison Psychiatry was consulted for suicidal statement by nurse practitioner Val Eagle  from the  [facility] service.  Interval history: 1/18 - patient started on escitalopram for depressive symptoms, one-to-one sitter placed  1/19 - patient seen today. She denies SI and reports improved mood. She denies any side-effects attributable to medications. Prior to interview, patient was endorsing chest pain and a nurse was called.  On initial presentation, for the past 2 weeks prior to admission, patient endorses depressed mood, anhedonia, feelings of worthlessness, poor concentration, and unintended weight loss. Negative for hypomanic / manic symptoms.  Since admitted for stroke, she experienced active suicidal ideation with intent to pull out her staples with a knife with the intent to kill herself. She has never experienced SI previously. Patient uses crack cocaine regularly and tested positive on urine toxicology on 1/13.  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  Last night she thought she was talking to her fianc, but he was not there.  She also thought she was at the grocery store for a brief moment but realized she was not. Given patient s/p stroke, admitted to inpatient hospital, and 4 days s/p positive cocaine on UDS, patient likely has delirium due to multiple etiologies,  acute, hypoactive  these diagnoses are provisional diagnoses and subject to change as the patient's clinical picture evolves or new information is revealed, including substances (drugs of abuse, medications), another medical condition, or better explained by another psychiatric diagnosis.  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 Based on my clinical evaluation, I estimate the patient to be at medium risk of self harm in the current setting - At this time, we recommend a in-person one-to-one level of observation. This decision is based on my review of the chart including patient's history and current presentation, interview of the patient, mental status examination, and consideration of suicide risk including evaluating suicidal ideation, plan, intent, suicidal or self-harm behaviors, risk factors, and protective factors. This judgment is based on our ability to directly address suicide risk, implement suicide prevention strategies and develop a safety plan while the patient is in the clinical setting. - Please contact our team if there is a concern that risk level has changed  ## Interventions (medications, psychoeducation, etc):  - Continue escitalopram 5 mg daily for treatment of depressive symptoms  ##  Medical Decision Making Capacity:  - Not formally  assessed during this encounter  ## Further Work-up: - most recent EKG on 10/22/2022 had QTc of 395 - pertinent labwork reviewed earlier this admission includes: CMP, lipid panel, CBC w/ diff, TSH, HbA1c, blood and urine toxicology  ## Disposition:  - No indication for inpatient psychiatric admission. Recommending outpatient psychiatry / psychology follow-up. Defer immediate disposition plan to primary team. Recommend referral to rehab facility and treatment for substance use - resources will be provided at discharge in AVS.  ## Behavioral / Environmental:  -- Standard delirium precautions and Fall precautions  ##Legal Status -- Patient voluntary  Thank you for this consult request. Our recommendations are listed above.  We will continue to follow patient's hospital course  Camelia Phenes, MD  NEW history  Relevant Aspects of Hospital Course:  Admitted on 10/16/2022 for MCA stroke.  Patient Report:  Patient says her chest hurts. Her mood is improved. She did not notice any side-effects after starting medications. She is not suicidal.  Collateral information obtained [name], patient's fiance) Nicole Kindred confirms patient account of crack cocaine and alcohol use. He says patient does not have firearms at home and does not have a stockpile of pills. He is not aware of her depressed mood. He does not know why she made the suicidal statement.  Past Psychiatric History:  Previous psych diagnoses:  none Prior inpatient psychiatric treatment:  denies  Social History:  Children: patient has 1 daughter, 1 granddaughter Employment: patient cleans, washes clothes Housing: living with fiance Weapons: does not own firearms  Substance Use History: Patient drinks 3 days of the week, 1 drink per day Spends "a lot" of money on crack cocaine - "$200 a pop" Recreational marijuana use Tobacco use  History of rehab: N/A  Family History:  Psych: none Suicide: none Substance use family hx:  "everyone except my dad" The patient's family history includes Cancer in her mother; Glaucoma in her father; Heart disease in her father; Varicose Veins in her paternal grandfather.  Medical History: Past Medical History:  Diagnosis Date   Hypertension     Surgical History: Past Surgical History:  Procedure Laterality Date   CRANIOTOMY Right 10/16/2022   Procedure: RIGHT CRANIECTOMY;  Surgeon: Newman Pies, MD;  Location: [place];  Service: Neurosurgery;  Laterality: Right;    Medications:   Current Facility-Administered Medications:    acetaminophen (TYLENOL) tablet 650 mg, 650 mg, Oral, Q4H PRN, 650 mg at 10/22/22 1004 **OR** acetaminophen (TYLENOL) suppository 650 mg, 650 mg, Rectal, Q4H PRN, Newman Pies, MD   apixaban Arne Cleveland) tablet 5 mg, 5 mg, Oral, BID, Leonie Man, Pramod S, MD, 5 mg at 10/22/22 1004   Chlorhexidine Gluconate Cloth 2 % PADS 6 each, 6 each, Topical, Daily, Newman Pies, MD, 6 each at 10/22/22 1005   docusate sodium (COLACE) capsule 100 mg, 100 mg, Oral, BID, Newman Pies, MD, 100 mg at 10/22/22 1003   escitalopram (LEXAPRO) tablet 5 mg, 5 mg, Oral, Daily, Camelia Phenes, MD, 5 mg at 10/22/22 1003   hydrALAZINE (APRESOLINE) injection 10-40 mg, 10-40 mg, Intravenous, Q4H PRN, Omar Person, NP   labetalol (NORMODYNE) injection 10-40 mg, 10-40 mg, Intravenous, Q4H PRN, Dewaine Oats, Katalina M, NP, 20 mg at 10/16/22 1920   ondansetron (ZOFRAN) tablet 4 mg, 4 mg, Oral, Q4H PRN, 4 mg at 10/20/22 1507 **OR** ondansetron (ZOFRAN) injection 4 mg, 4 mg, Intravenous, Q4H PRN, Newman Pies, MD   Oral care mouth rinse, 15 mL, Mouth Rinse, 4 times  per day, Kerney Elbe, MD, 15 mL at 10/22/22 0843   Oral care mouth rinse, 15 mL, Mouth Rinse, PRN, Kerney Elbe, MD   pantoprazole (PROTONIX) EC tablet 40 mg, 40 mg, Oral, QHS, Donnamae Jude, RPH, 40 mg at 10/21/22 2224   polyethylene glycol (MIRALAX / GLYCOLAX) packet 17 g, 17 g, Oral, Daily PRN, Newman Pies,  MD   polyethylene glycol (MIRALAX / GLYCOLAX) packet 17 g, 17 g, Oral, Daily, Leonie Man, Pramod S, MD, 17 g at 10/22/22 1004   promethazine (PHENERGAN) tablet 12.5-25 mg, 12.5-25 mg, Oral, Q4H PRN, Newman Pies, MD   sodium chloride flush (NS) 0.9 % injection 10-40 mL, 10-40 mL, Intracatheter, Q12H, Newman Pies, MD, 10 mL at 10/22/22 1005   sodium chloride flush (NS) 0.9 % injection 10-40 mL, 10-40 mL, Intracatheter, PRN, Newman Pies, MD  Allergies: No Known Allergies  Objective  Vital signs:  Temp:  [98.2 F (36.8 C)-98.9 F (37.2 C)] 98.4 F (36.9 C) (01/19 1152) Pulse Rate:  [63-76] 65 (01/19 1152) Resp:  [18-20] 20 (01/19 1152) BP: (125-162)/(69-109) 162/109 (01/19 1152) SpO2:  [98 %-99 %] 98 % (01/19 1152) Weight:  [98.1 kg] 98.1 kg (01/19 0500)  Mental Status Exam:  Appearance and Grooming: Patient is casually dressed in hospital gown . The patient has no noticeable scent or odor.  Behavior: The patient appears in no acute distress, and during the interview, was calm, required minimal redirection, and behaving appropriately to scenario. She was able to follow commands and compliant to requests and made minimal eye contact.  The patient did not appear internally or externally preoccupied.  Attitude: Patient's attitude towards the interviewer was cooperative and open.  Motor activity: There was no notable abnormal facial movements and no notable abnormal extremity movements.  Speech: The volume of her speech was normal and normal in quantity. The rate was normal with a normal rhythm. Responses were normal in latency. She has notable slurred speech.  Mood: "My chest hurts"  Affect: Patient's affect is dysphoric with restricted range and even fluctuations. Her affect is appropriate for the topic of conversation. ------------------------------------------------------------------------------------------------------------------------- Perception The patient  experiences no hallucinations, though she states she talks to herself a lot  Thought Content The patient describes no delusional thoughts.  Patient denies active suicidal intent and denies passive suicidal ideation at present.  Thought Process The patient's thought process is linear and is goal-directed.  Insight The patient at the time of interview demonstrates good insight, as evidenced by understanding of mental health condition/s, acknowledgement of substance use disorder/s, and ability to identify adaptive and maladaptive coping strategies.  Judgement The patient over the past 24 hours demonstrates good judgement, as evidenced by seeking placement for rehab, adhering to non-psychotropic medication regimen, and engaging appropriately with staff / other patients.   Assets  Assets:Communication Skills; Desire for Improvement; Financial Resources/Insurance; Housing; Intimacy; Resilience; Social Support; Vocational/Educational   Sleep  Sleep:Sleep: Good  Physical Exam: Physical Exam Vitals and nursing note reviewed.  HENT:     Head: Normocephalic and atraumatic.  Pulmonary:     Effort: Pulmonary effort is normal.  Neurological:     Mental Status: She is alert and oriented to person, place, and time.     Comments: obvious FNDs s/p stroke but not evaluated by this writer     Blood pressure (!) 162/109, pulse 65, temperature 98.4 F (36.9 C), temperature source Oral, resp. rate 20, height 5\' 2"  (1.575 m), weight 98.1 kg, SpO2 98 %. Body mass index is  39.56 kg/m.

## 2022-10-23 LAB — CBC
HCT: 32.6 % — ABNORMAL LOW (ref 36.0–46.0)
Hemoglobin: 10.9 g/dL — ABNORMAL LOW (ref 12.0–15.0)
MCH: 29.5 pg (ref 26.0–34.0)
MCHC: 33.4 g/dL (ref 30.0–36.0)
MCV: 88.1 fL (ref 80.0–100.0)
Platelets: 265 10*3/uL (ref 150–400)
RBC: 3.7 MIL/uL — ABNORMAL LOW (ref 3.87–5.11)
RDW: 12.9 % (ref 11.5–15.5)
WBC: 8.6 10*3/uL (ref 4.0–10.5)
nRBC: 0.2 % (ref 0.0–0.2)

## 2022-10-23 LAB — GLUCOSE, CAPILLARY
Glucose-Capillary: 117 mg/dL — ABNORMAL HIGH (ref 70–99)
Glucose-Capillary: 117 mg/dL — ABNORMAL HIGH (ref 70–99)
Glucose-Capillary: 119 mg/dL — ABNORMAL HIGH (ref 70–99)

## 2022-10-23 NOTE — Progress Notes (Signed)
Patient is status post right craniectomy for decompression.  Currently awaiting transfer to skilled nursing facility.  It appears her family needs to sign paperwork for transfer.  Orders are placed.  Patient remains at her neurologic baseline, with left hemiplegia, is communicative.  Wound is clean dry and intact.

## 2022-10-23 NOTE — Progress Notes (Signed)
Attempted to call report to nurse at [facility], no answer at this time.

## 2022-10-23 NOTE — TOC Transition Note (Signed)
Transition of Care [facility]) - CM/SW Discharge Note   Patient Details  Name: Sheri Beck MRN: [id number] Date of Birth: [birth year]  Transition [facility] Care [facility] CM/SW Contact:  Amador Cunas, [place] Phone Number: 10/23/2022, 2:15 PM   Clinical Narrative: Pt for dc to [facility] after being sitter-free for 24 hours at 3pm. Pt's dtr Shemaka aware of dc and reports agreeable. Janie at [facility] confirmed pt able to admit to room 3206. RN provided with number for report and PTAR arranged for transport. SW signing off at dc.   Wandra Feinstein, MSW, LCSW [phone] (coverage)        Final next level of care: Skilled Nursing Facility Barriers to Discharge: No Barriers Identified   Patient Goals and CMS Choice CMS Medicare.gov Compare Post Acute Care list provided to:: Patient Represenative (must comment) Choice offered to / list presented to : Adult Children  Discharge Placement                Patient chooses bed at: [facility] Patient to be transferred to facility by: [place] Name of family member notified: Sheri Beck Patient and family notified of of transfer: 10/23/22  Discharge Plan and Services Additional resources added to the After Visit Summary for   In-house Referral: Clinical Social Work   Post Acute Care Choice: [place]                               Social Determinants of Health (SDOH) Interventions SDOH Screenings   Food Insecurity: No Food Insecurity (10/20/2022)  Housing: Low Risk  (10/20/2022)  Transportation Needs: No Transportation Needs (10/20/2022)  Utilities: Not At Risk (10/20/2022)  Depression (PHQ2-9): Low Risk  (04/08/2022)  Tobacco Use: High Risk (10/17/2022)     Readmission Risk Interventions     No data to display

## 2022-10-26 ENCOUNTER — Encounter (HOSPITAL_COMMUNITY): Payer: Self-pay

## 2022-10-26 ENCOUNTER — Emergency Department (HOSPITAL_COMMUNITY): Payer: Medicaid Other

## 2022-10-26 ENCOUNTER — Emergency Department (HOSPITAL_COMMUNITY)
Admission: EM | Admit: 2022-10-26 | Discharge: 2022-10-26 | Disposition: A | Discharge status: Home / Self Care | Payer: Medicaid Other | Attending: Emergency Medicine | Admitting: Emergency Medicine

## 2022-10-26 ENCOUNTER — Other Ambulatory Visit: Payer: Self-pay

## 2022-10-26 DIAGNOSIS — W19XXXA Unspecified fall, initial encounter: Secondary | ICD-10-CM

## 2022-10-26 DIAGNOSIS — Z7901 Long term (current) use of anticoagulants: Secondary | ICD-10-CM | POA: Diagnosis not present

## 2022-10-26 DIAGNOSIS — Z79899 Other long term (current) drug therapy: Secondary | ICD-10-CM | POA: Insufficient documentation

## 2022-10-26 DIAGNOSIS — W06XXXA Fall from bed, initial encounter: Secondary | ICD-10-CM | POA: Diagnosis not present

## 2022-10-26 DIAGNOSIS — Z8673 Personal history of transient ischemic attack (TIA), and cerebral infarction without residual deficits: Secondary | ICD-10-CM | POA: Diagnosis not present

## 2022-10-26 DIAGNOSIS — R7401 Elevation of levels of liver transaminase levels: Secondary | ICD-10-CM | POA: Insufficient documentation

## 2022-10-26 DIAGNOSIS — R109 Unspecified abdominal pain: Secondary | ICD-10-CM | POA: Diagnosis present

## 2022-10-26 DIAGNOSIS — I10 Essential (primary) hypertension: Secondary | ICD-10-CM | POA: Diagnosis not present

## 2022-10-26 LAB — COMPREHENSIVE METABOLIC PANEL
ALT: 180 U/L — ABNORMAL HIGH (ref 0–44)
AST: 90 U/L — ABNORMAL HIGH (ref 15–41)
Albumin: 3.2 g/dL — ABNORMAL LOW (ref 3.5–5.0)
Alkaline Phosphatase: 77 U/L (ref 38–126)
Anion gap: 8 (ref 5–15)
BUN: 15 mg/dL (ref 6–20)
CO2: 26 mmol/L (ref 22–32)
Calcium: 9.7 mg/dL (ref 8.9–10.3)
Chloride: 104 mmol/L (ref 98–111)
Creatinine, Ser: 0.78 mg/dL (ref 0.44–1.00)
GFR, Estimated: >60 mL/min (ref >60)
Glucose, Bld: 106 mg/dL — ABNORMAL HIGH (ref 70–99)
Potassium: 4.4 mmol/L (ref 3.5–5.1)
Sodium: 138 mmol/L (ref 135–145)
Total Bilirubin: 0.3 mg/dL (ref 0.3–1.2)
Total Protein: 6.8 g/dL (ref 6.5–8.1)

## 2022-10-26 LAB — URINALYSIS, ROUTINE W REFLEX MICROSCOPIC
Bilirubin Urine: NEGATIVE
Glucose, UA: NEGATIVE mg/dL
Hgb urine dipstick: NEGATIVE
Ketones, ur: NEGATIVE mg/dL
Leukocytes,Ua: NEGATIVE
Nitrite: NEGATIVE
Protein, ur: NEGATIVE mg/dL
Specific Gravity, Urine: >1.046 — ABNORMAL HIGH (ref 1.005–1.030)
pH: 5 (ref 5.0–8.0)

## 2022-10-26 LAB — CBC WITH DIFFERENTIAL/PLATELET
Abs Immature Granulocytes: 0.06 10*3/uL (ref 0.00–0.07)
Basophils Absolute: 0.1 10*3/uL (ref 0.0–0.1)
Basophils Relative: 1 %
Eosinophils Absolute: 0.1 10*3/uL (ref 0.0–0.5)
Eosinophils Relative: 1 %
HCT: 41.9 % (ref 36.0–46.0)
Hemoglobin: 14 g/dL (ref 12.0–15.0)
Immature Granulocytes: 1 %
Lymphocytes Relative: 33 %
Lymphs Abs: 3.5 10*3/uL (ref 0.7–4.0)
MCH: 30.2 pg (ref 26.0–34.0)
MCHC: 33.4 g/dL (ref 30.0–36.0)
MCV: 90.5 fL (ref 80.0–100.0)
Monocytes Absolute: 1 10*3/uL (ref 0.1–1.0)
Monocytes Relative: 9 %
Neutro Abs: 5.9 10*3/uL (ref 1.7–7.7)
Neutrophils Relative %: 55 %
Platelets: 455 10*3/uL — ABNORMAL HIGH (ref 150–400)
RBC: 4.63 MIL/uL (ref 3.87–5.11)
RDW: 13.6 % (ref 11.5–15.5)
WBC: 10.7 10*3/uL — ABNORMAL HIGH (ref 4.0–10.5)
nRBC: 0 % (ref 0.0–0.2)

## 2022-10-26 MED ORDER — APIXABAN 5 MG PO TABS
5.0000 mg | ORAL_TABLET | Freq: Once | ORAL | Status: AC
  Administered 2022-10-26: 5 mg via ORAL
  Filled 2022-10-26: qty 1

## 2022-10-26 MED ORDER — APIXABAN 5 MG PO TABS: 5.0000 mg | ORAL_TABLET | Freq: Two times a day (BID) | ORAL | Status: DC

## 2022-10-26 MED ORDER — ACETAMINOPHEN 500 MG PO TABS
1000.0000 mg | ORAL_TABLET | Freq: Once | ORAL | Status: AC
  Administered 2022-10-26: 1000 mg via ORAL
  Filled 2022-10-26: qty 2

## 2022-10-26 MED ORDER — IOHEXOL 350 MG/ML SOLN
75.0000 mL | Freq: Once | INTRAVENOUS | Status: AC | PRN
  Administered 2022-10-26: 75 mL via INTRAVENOUS

## 2022-10-26 NOTE — ED Provider Notes (Signed)
[place] EMERGENCY DEPARTMENT AT [facility] Provider Note   CSN: [id number] Arrival date & time: 10/26/22  4128     History  Chief Complaint  Patient presents with   Fall    Pt presents to ED for evaluation of unwitnessed fall out of bed. Only reports abdominal pain near previous surgical site. Denies any other pain or LOC. Pt had recent CVA with left sided deficits.     Sheri Beck is a 57 y.o. 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  Craniectomy flap was placed into her abdominal subcutaneous tissue on the right side of her abdomen as well.  Also developed multiple DVTs and transition from heparin to Eliquis.  Psychiatry started Lexapro.  Discharged to a skilled nursing facility.  Patient states that she fell out of her bed this morning because of but does not have bed rails.  She states that she hit her abdomen on the ground.  She did not hit her head or lose consciousness.  Denies any head pain, just itchiness around the staples from her recent craniectomy.  Endorses abdominal pain at the site of her surgery/craniectomy flap in her abdomen but states that it is the same as before the fall.  Patient denies any fever/chills, cough, shortness of breath, chest pain.  She endorses tingling in her left lower extremity and states that she is beginning to get feeling back in the left lower extremity but still cannot move it at all.  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    Fall       Home Medications Prior to Admission medications   Medication Sig Start Date End Date Taking? Authorizing Provider  amLODipine (NORVASC) 10 MG  tablet Take 1 tablet by mouth once daily 10/05/22   Karie Georges, MD  apixaban (ELIQUIS) 5 MG TABS tablet Take 1 tablet (5 mg total) by mouth 2 (two) times daily. 10/22/22   Val Eagle D, NP  escitalopram (LEXAPRO) 5 MG tablet Take 1 tablet (5 mg total) by mouth daily. 10/23/22   Val Eagle D, NP  lisinopril-hydrochlorothiazide (ZESTORETIC) 20-12.5 MG tablet Take 1 tablet by mouth once daily 10/05/22   Karie Georges, MD  ondansetron (ZOFRAN) 4 MG tablet Take 1 tablet (4 mg total) by mouth every 4 (four) hours as needed for nausea or vomiting. 10/22/22   Val Eagle D, NP  polyethylene glycol (MIRALAX / GLYCOLAX) 17 g packet Take 17 g by mouth daily as needed for moderate constipation. 10/22/22   Val Eagle D, NP  promethazine (PHENERGAN) 12.5 MG tablet Take 1-2 tablets (12.5-25 mg total) by mouth every 4 (four) hours as needed for refractory nausea / vomiting. 10/22/22   Val Eagle D, NP      Allergies    Patient has no known allergies.    Review of Systems   Review of Systems Review of systems Negative for head injury, LOC.  A 10 point review of systems was performed and is negative unless otherwise reported in HPI.  Physical Exam Updated Vital Signs BP 116/75   Pulse (!) 59   Temp 98.9 F (37.2 C) (Oral)   Resp (!) 24  Ht 5\' 2"  (1.575 m)   Wt 95 kg   SpO2 98%   BMI 38.31 kg/m  Physical Exam General: Normal appearing female, lying in bed.  HEENT: PERRLA, EOMI, Sclera anicteric, MMM, trachea midline.  U-shaped surgical site on patient's right skull healing well without any erythema, induration, fluctuance or tenderness palpation. Otherwise no scalp lacerations/wounds, no midline C-spine tenderness or stepoffs. Forehead/midface stable.  Cardiology: RRR, no murmurs/rubs/gallops. BL radial and DP pulses equal bilaterally.  Resp: Normal respiratory rate and effort. CTAB, no wheezes, rhonchi, crackles.  Abd: 10 cm surgical site closed with staples transversely  in right abdomen with mass palpable underneath representing patient's craniectomy flap.  Mild tenderness palpation around the site with no erythema, induration, fluctuance, or purulent drainage.  Soft, non-distended. No rebound tenderness or guarding.  GU: Deferred. MSK: No peripheral edema or signs of trauma. Extremities without deformity or TTP. No cyanosis or clubbing. Skin: warm, dry. No rashes or lesions. Back: No T or L spine tenderness. Neuro: A&Ox4, CNs II-XII grossly intact. 0/5 strength in LUE/LLE. No sensation in LUE, some sensation to light touch in LLE.5/5 strength in RUE/RLE.  Psych: Tired, flat mood and affect.   ED Results / Procedures / Treatments   Labs (all labs ordered are listed, but only abnormal results are displayed) Labs Reviewed  CBC WITH DIFFERENTIAL/PLATELET - Abnormal; Notable for the following components:      Result Value   WBC 10.7 (*)    Platelets 455 (*)    All other components within normal limits  COMPREHENSIVE METABOLIC PANEL - Abnormal; Notable for the following components:   Glucose, Bld 106 (*)    Albumin 3.2 (*)    AST 90 (*)    ALT 180 (*)    All other components within normal limits  URINALYSIS, ROUTINE W REFLEX MICROSCOPIC - Abnormal; Notable for the following components:   Specific Gravity, Urine >1.046 (*)    All other components within normal limits    EKG EKG Interpretation  Date/Time:  Tuesday October 26 2022 08:26:06 EST Ventricular Rate:  69 PR Interval:  151 QRS Duration: 90 QT Interval:  402 QTC Calculation: 431 R Axis:   1 Text Interpretation: Sinus rhythm  No significant change from prior Confirmed by 08-30-2003 [phone]) on 10/26/2022 9:14:09 AM  Radiology CT ABDOMEN PELVIS W CONTRAST  Result Date: 10/26/2022 CLINICAL DATA:  Abdominal pain at the surgical site EXAM: CT ABDOMEN AND PELVIS WITH CONTRAST TECHNIQUE: Multidetector CT imaging of the abdomen and pelvis was performed using the standard protocol following bolus  administration of intravenous contrast. RADIATION DOSE REDUCTION: This exam was performed according to the departmental dose-optimization program which includes automated exposure control, adjustment of the mA and/or kV according to patient size and/or use of iterative reconstruction technique. CONTRAST:  46mL OMNIPAQUE IOHEXOL 350 MG/ML SOLN COMPARISON:  None Available. FINDINGS: Lower chest: No focal consolidation or pulmonary nodule in the lung bases. No pleural effusion or pneumothorax demonstrated. Partially imaged heart size is normal. Hepatobiliary: No focal hepatic lesions. No intra or extrahepatic biliary ductal dilation. Normal gallbladder. Pancreas: No focal lesions or main ductal dilation. Spleen: Normal in size without focal abnormality. Adrenals/Urinary Tract: No adrenal nodules. Duplex right kidney. Minimal asymmetric fullness of the lower pole collecting system with questionable punctate renal stone in the distal right ureter (3:78). Evaluation of the distal ureter is somewhat technically challenging due to beam hardening artifact from the enteric contrast material. No suspicious renal mass or hydronephrosis. No focal bladder wall  thickening. Stomach/Bowel: Normal appearance of the stomach. Hyperattenuating enteric contrast is present within the transverse colon through the rectum. No evidence of bowel wall thickening, distention, or inflammatory changes. Normal contrast-filled appendix. Vascular/Lymphatic: Aortic atherosclerosis. No enlarged abdominal or pelvic lymph nodes. Reproductive: No adnexal masses. Other: No free fluid, fluid collection, or free air. Musculoskeletal: No acute or abnormal lytic or blastic osseous lesions. Within the right anterior abdominal wall, there is a gas-containing fluid pocket associated with the known craniotomy flap. There is mild surrounding subcutaneous soft tissue fat stranding. IMPRESSION: 1. Duplex right kidney with minimal asymmetric fullness of the lower pole  collecting system with questionable punctate renal stone in the distal right ureter. Evaluation of the distal ureter is somewhat technically challenging due to beam hardening artifact from the enteric contrast material. Recommend correlation with urinalysis. 2. Gas-containing fluid pocket associated with the known craniotomy flap in the right anterior abdominal wall with mild surrounding subcutaneous soft tissue fat stranding. Findings could represent postoperative changes versus developing infection. 3.  Aortic Atherosclerosis (ICD10-I70.0). Electronically Signed   By: Darrin Nipper M.D.   On: 10/26/2022 10:52    Procedures Procedures    Medications Ordered in ED Medications  iohexol (OMNIPAQUE) 350 MG/ML injection 75 mL (75 mLs Intravenous Contrast Given 10/26/22 1026)  acetaminophen (TYLENOL) tablet 1,000 mg (1,000 mg Oral Given 10/26/22 1149)  apixaban (ELIQUIS) tablet 5 mg (5 mg Oral Given 10/26/22 1203)    ED Course/ Medical Decision Making/ A&P                          Medical Decision Making Amount and/or Complexity of Data Reviewed Labs: ordered. Decision-making details documented in ED Course. Radiology: ordered. Decision-making details documented in ED Course.  Risk OTC drugs. Prescription drug management.    This patient presents to the ED for concern of fall, this involves an extensive number of treatment options, and is a complaint that carries with it a high risk of complications and morbidity.  I considered the following differential and admission for this acute, potentially life threatening condition.   MDM:    DDX for trauma includes but is not limited to:  Patient did not have any head trauma and has no headache currently.  She does have a recent craniectomy but she has no pain or any new focal neurodeficits, did not hit her head, do not believe neuroimaging is required at this time.  Unclear exactly why patient fell out of her bed this morning and given her recent  extensive medical history will obtain labs including CBC, CMP, and UA.  Patient does endorse abdominal pain after the fall but is a poor historian regarding the level of pain or when exactly it started, whether it was before the fall or after the fall.  Again given her recent surgical intervention and craniectomy flap in her abdomen with pain will obtain CT abdomen pelvis to ensure no intra-abdominal trauma such as liver or splenic laceration, bowel perforation, hematoma/hemorrhage.  Patient is hemodynamically stable and overall well-appearing.    Clinical Course as of 10/26/22 1250  Tue Oct 26, 2022  1056 CT ABDOMEN PELVIS W CONTRAST 1. Duplex right kidney with minimal asymmetric fullness of the lower pole collecting system with questionable punctate renal stone in the distal right ureter. Evaluation of the distal ureter is somewhat technically challenging due to beam hardening artifact from the enteric contrast material. Recommend correlation with urinalysis. 2. Gas-containing fluid pocket associated with the known  craniotomy flap in the right anterior abdominal wall with mild surrounding subcutaneous soft tissue fat stranding. Findings could represent postoperative changes versus developing infection. 3.  Aortic Atherosclerosis (ICD10-I70.0).   [HN]  1056 No traumatic injuries in abdomen noted. Patient with no f/c, minimal leukocytosis, low c/f developing infection and likely represents postop changes. UA pending. [HN]  1154 Urinalysis, Routine w reflex microscopic(!) High spec grav but otherwise unremarkable. No Hgb noted, low c/f ureterolithiasis [HN]  1155 AST 90/ALT 180, newly elevated. T bili wnl at 0.3.  [HN]  5366 FYI'd Dr. Arnoldo Morale. Low c/f post-op infection.  Patient followed with Dr. Arnoldo Morale in clinic.  Also advised patient that her labs including LFTs rechecked with her primary care physician. [HN]  1247 Patient with no traumatic intraabdominal injuries. Patient is stable to f/u as an  o/p. Given DC instructions/return precautions. [HN]    Clinical Course User Index [HN] Audley Hose, MD    Labs: I Ordered, and personally interpreted labs.  The pertinent results include:  those listed above  Imaging Studies ordered: I ordered imaging studies including CT abd pelvis w/ contrast I independently visualized and interpreted imaging. I agree with the radiologist interpretation  Additional history obtained from chart review.   Cardiac Monitoring: The patient was maintained on a cardiac monitor.  I personally viewed and interpreted the cardiac monitored which showed an underlying rhythm of: NSR  Reevaluation: After the interventions noted above, I reevaluated the patient and found that they have :improved  Social Determinants of Health: Currently living in SNF  Disposition:  DC to SNF   Co morbidities that complicate the patient evaluation  Past Medical History:  Diagnosis Date   Hypertension      Medicines Meds ordered this encounter  Medications   iohexol (OMNIPAQUE) 350 MG/ML injection 75 mL   acetaminophen (TYLENOL) tablet 1,000 mg   DISCONTD: apixaban (ELIQUIS) tablet 5 mg   apixaban (ELIQUIS) tablet 5 mg    I have reviewed the patients home medicines and have made adjustments as needed  Problem List / ED Course: Problem List Items Addressed This Visit   None Visit Diagnoses     Fall, initial encounter    -  Primary   Transaminitis                       This note was created using dictation software, which may contain spelling or grammatical errors.    Audley Hose, MD 10/26/22 1250

## 2022-10-26 NOTE — ED Notes (Signed)
I was unable to obtain pt's blood. I notified phlebotomy to try and they stated they will

## 2022-10-26 NOTE — ED Notes (Signed)
Pt states have abdominal pain at surgical site on abdomen. Pt's staples are intact and the wound is well approximated clean, dry and intact.

## 2022-10-26 NOTE — Discharge Instructions (Signed)
Thank you for coming to [facility] Emergency Department. You were seen for fall out of bed. We did an exam, labs, and imaging, and these showed fluid collection around your skull in your abdomen, which is to be expected. It also showed a minor elevation in your liver function tests. We recommend these labs be rechecked within the week.   Please follow up with your primary care provider within 1 week.   Do not hesitate to return to the ED or call 911 if you experience: -Worsening symptoms -Worsening abdominal pain -Yellowing of the skin -Nausea, vomiting so severe you cannot eat/drink anything -Lightheadedness, passing out -Fevers/chills -Anything else that concerns you

## 2022-10-26 NOTE — ED Notes (Signed)
Patient transported to CT 

## 2022-10-27 ENCOUNTER — Telehealth: Payer: Self-pay

## 2022-10-31 ENCOUNTER — Emergency Department (HOSPITAL_COMMUNITY): Payer: Medicaid Other

## 2022-10-31 ENCOUNTER — Emergency Department (HOSPITAL_COMMUNITY)
Admission: EM | Admit: 2022-10-31 | Discharge: 2022-11-01 | Disposition: A | Discharge status: Home / Self Care | Payer: Medicaid Other | Attending: Emergency Medicine | Admitting: Emergency Medicine

## 2022-10-31 ENCOUNTER — Other Ambulatory Visit: Payer: Self-pay

## 2022-10-31 DIAGNOSIS — Z7901 Long term (current) use of anticoagulants: Secondary | ICD-10-CM | POA: Diagnosis not present

## 2022-10-31 DIAGNOSIS — H538 Other visual disturbances: Secondary | ICD-10-CM

## 2022-10-31 DIAGNOSIS — R0689 Other abnormalities of breathing: Secondary | ICD-10-CM | POA: Insufficient documentation

## 2022-10-31 DIAGNOSIS — I629 Nontraumatic intracranial hemorrhage, unspecified: Secondary | ICD-10-CM | POA: Diagnosis not present

## 2022-10-31 DIAGNOSIS — R519 Headache, unspecified: Secondary | ICD-10-CM

## 2022-10-31 DIAGNOSIS — R079 Chest pain, unspecified: Secondary | ICD-10-CM | POA: Diagnosis not present

## 2022-10-31 DIAGNOSIS — Z9889 Other specified postprocedural states: Secondary | ICD-10-CM | POA: Diagnosis not present

## 2022-10-31 DIAGNOSIS — I6930 Unspecified sequelae of cerebral infarction: Secondary | ICD-10-CM | POA: Diagnosis not present

## 2022-10-31 LAB — CBC
HCT: 40.8 % (ref 36.0–46.0)
Hemoglobin: 13.5 g/dL (ref 12.0–15.0)
MCH: 29.3 pg (ref 26.0–34.0)
MCHC: 33.1 g/dL (ref 30.0–36.0)
MCV: 88.7 fL (ref 80.0–100.0)
Platelets: 492 10*3/uL — ABNORMAL HIGH (ref 150–400)
RBC: 4.6 MIL/uL (ref 3.87–5.11)
RDW: 13.2 % (ref 11.5–15.5)
WBC: 7.4 10*3/uL (ref 4.0–10.5)
nRBC: 0 % (ref 0.0–0.2)

## 2022-10-31 LAB — I-STAT BETA HCG BLOOD, ED (MC, WL, AP ONLY): I-stat hCG, quantitative: <5 m[IU]/mL (ref <5)

## 2022-10-31 NOTE — ED Provider Notes (Signed)
[facility] Emergency Department Provider Note MRN:  [id number]  Arrival date & time: 11/01/22     Chief Complaint   Chest Pain, Headache, and Eye Problem   History of Present Illness   Sheri Beck is a 57 y.o. year-old female presents to the ED with chief complaint of chest pain that started this afternoon.  Symptoms have resolved.  She also reports that her vision seems a bit blurry and she is concerned she has fluid on her brain.  She is S/P 2 weeks right frontal temporal parietal decompressive craniectomy by Dr. Arnoldo Morale.  She has been in rehab.  She is anticoagulated on Eliquis.  She denies any new numbness, weakness, or tingling.  Hemiparetic on the left with residual rightward gaze.  History provided by patient.   Review of Systems  Pertinent positive and negative review of systems noted in HPI.    Physical Exam   Vitals:   11/01/22 0300 11/01/22 0400  BP: 98/69 104/64  Pulse: 80 84  Resp: 15 15  Temp:    SpO2: 95% 96%    CONSTITUTIONAL:  chronically ill-appearing, NAD NEURO:  Alert and oriented x 3, CN 3-12 grossly intact EYES:  eyes equal and reactive, residual rightward gaze ENT/NECK:  Supple, no stridor  CARDIO:  normal rate, appears well-perfused  PULM:  No respiratory distress,  GI/GU:  non-distended,  MSK/SPINE:  No gross deformities, left sided hemiparesis SKIN:  no rash, atraumatic, well healing surgical incision on scalp with staples   *Additional and/or pertinent findings included in MDM below  Diagnostic and Interventional Summary    EKG Interpretation  Date/Time:  Sunday October 31 2022 21:43:02 EST Ventricular Rate:  74 PR Interval:  156 QRS Duration: 94 QT Interval:  377 QTC Calculation: 419 R Axis:   16 Text Interpretation: Sinus rhythm  no change from prior Confirmed by Palumbo, April ([id number]) on 11/01/2022 3:08:55 AM       Labs Reviewed  CBC - Abnormal; Notable for the following components:      Result Value    Platelets 492 (*)    All other components within normal limits  COMPREHENSIVE METABOLIC PANEL - Abnormal; Notable for the following components:   Sodium 134 (*)    Glucose, Bld 101 (*)    BUN 29 (*)    Creatinine, Ser 1.07 (*)    Albumin 3.4 (*)    AST 72 (*)    ALT 131 (*)    All other components within normal limits  BRAIN NATRIURETIC PEPTIDE  I-STAT BETA HCG BLOOD, ED ([facility], [facility], [facility] ONLY)  TROPONIN I (HIGH SENSITIVITY)  TROPONIN I (HIGH SENSITIVITY)    CT HEAD WO CONTRAST (5MM)  Final Result    DG Chest Portable 1 View  Final Result    CT HEAD WO CONTRAST (5MM)  Final Result      Medications  prochlorperazine (COMPAZINE) injection 10 mg (10 mg Intravenous Given 11/01/22 0129)     Procedures  /  Critical Care .Critical Care  Performed by: Montine Circle, PA-C Authorized by: Montine Circle, PA-C   Critical care provider statement:    Critical care time (minutes):  43   Critical care was necessary to treat or prevent imminent or life-threatening deterioration of the following conditions:  CNS failure or compromise   Critical care was time spent personally by me on the following activities:  Development of treatment plan with patient or surrogate, discussions with consultants, evaluation of patient's response to treatment, examination  of patient, ordering and review of laboratory studies, ordering and review of radiographic studies, ordering and performing treatments and interventions, pulse oximetry, re-evaluation of patient's condition and review of old charts   ED Course and Medical Decision Making  I have reviewed the triage vital signs, the nursing notes, and pertinent available records from the EMR.  Social Determinants Affecting Complexity of Care: Patient has no clinically significant social determinants affecting this chief complaint..   ED Course: Clinical Course as of 11/01/22 0600  Sun Oct 31, 2022  2331 Discussed with Dr. Ellene Route, who will look at the  scans and call me back. [RB]  2347 I discussed the case in detail with Dr. Ellene Route, who has reviewed the case and the scans in detail.  He tells me that barring a need for medical admission, she can be sent back to rehab.  No additional surgical intervention indicated.  States that the next step from neurosurg would be to have a repeat scan in a couple of weeks. [RB]  Mon Nov 01, 2022  0559 Repeat CT is stable.  Dr. Rory Percy recommends discharge with follow-up in neurology clinic with Dr. Leonie Man.  I've placed amb referral. [RB]    Clinical Course User Index [RB] Montine Circle, PA-C    Medical Decision Making Patient here with blurry vision and headache.  Had recent large right MCA with head bleed.  Had craniectomy.  Currently in rehab.  Reports worsening headache.  CT and labs ordered.  CT shows new bleed.  Will consult neurosurgery for recommendations.  Amount and/or Complexity of Data Reviewed Labs: ordered.    Details: HCG negative Trop negative HGB 13.5 Radiology: ordered and independent interpretation performed.    Details: Recent right MCA stroke seen with new intracranial bleeding     Consultants: I discussed the case with Dr. Rory Percy, who noticed the CT scan of the head while I was looking at it in the office.  We ended up discussing the case, which he was familiar with from her prior visit.  He advised me to get a repeat CT scan in 6 hours, which if unchanged, then the patient can go home.  Patient was agreeable with this plan.   Treatment and Plan: I considered admission due to patient's initial presentation, but after considering the examination and diagnostic results, patient will not require admission and can be discharged with outpatient follow-up.    Final Clinical Impressions(s) / ED Diagnoses     ICD-10-CM   1. Nonintractable headache, unspecified chronicity pattern, unspecified headache type  R51.9     2. Blurry vision  H53.8     3. Intracranial hemorrhage ([place])   I62.9       ED Discharge Orders          Ordered    Ambulatory referral to Neurology       Comments: An appointment is requested in approximately: 1 week   11/01/22 0555              Discharge Instructions Discussed with and Provided to Patient:     Discharge Instructions      Your head CT showed some bleeding around your old stroke.  This did not change with the repeat CT scan.  The neurologist recommends that you follow up with the neurologist.  The neurology office will call you.       Montine Circle, PA-C 11/01/22 3875    Fransico Meadow, MD 11/01/22 1318

## 2022-10-31 NOTE — ED Notes (Signed)
IV team at bedside 

## 2022-10-31 NOTE — ED Triage Notes (Addendum)
BIB EMS rehab cc blurred vision, ha, chest tightness x 3hrs ago. Hx stroke in [place], left sided stroke deficits and right gaze. On eliquis   Patient expresses not wanting to return to the rehab

## 2022-11-01 ENCOUNTER — Emergency Department (HOSPITAL_COMMUNITY): Payer: Medicaid Other

## 2022-11-01 DIAGNOSIS — Z9889 Other specified postprocedural states: Secondary | ICD-10-CM | POA: Diagnosis not present

## 2022-11-01 DIAGNOSIS — I6930 Unspecified sequelae of cerebral infarction: Secondary | ICD-10-CM

## 2022-11-01 LAB — COMPREHENSIVE METABOLIC PANEL
ALT: 131 U/L — ABNORMAL HIGH (ref 0–44)
AST: 72 U/L — ABNORMAL HIGH (ref 15–41)
Albumin: 3.4 g/dL — ABNORMAL LOW (ref 3.5–5.0)
Alkaline Phosphatase: 81 U/L (ref 38–126)
Anion gap: 12 (ref 5–15)
BUN: 29 mg/dL — ABNORMAL HIGH (ref 6–20)
CO2: 23 mmol/L (ref 22–32)
Calcium: 9.9 mg/dL (ref 8.9–10.3)
Chloride: 99 mmol/L (ref 98–111)
Creatinine, Ser: 1.07 mg/dL — ABNORMAL HIGH (ref 0.44–1.00)
GFR, Estimated: >60 mL/min (ref >60)
Glucose, Bld: 101 mg/dL — ABNORMAL HIGH (ref 70–99)
Potassium: 4.1 mmol/L (ref 3.5–5.1)
Sodium: 134 mmol/L — ABNORMAL LOW (ref 135–145)
Total Bilirubin: 0.5 mg/dL (ref 0.3–1.2)
Total Protein: 7 g/dL (ref 6.5–8.1)

## 2022-11-01 LAB — TROPONIN I (HIGH SENSITIVITY): Troponin I (High Sensitivity): 4 ng/L (ref <18)

## 2022-11-01 LAB — BRAIN NATRIURETIC PEPTIDE: B Natriuretic Peptide: 15.2 pg/mL (ref 0.0–100.0)

## 2022-11-01 MED ORDER — PROCHLORPERAZINE EDISYLATE 10 MG/2ML IJ SOLN
10.0000 mg | Freq: Once | INTRAMUSCULAR | Status: AC
  Administered 2022-11-01: 10 mg via INTRAVENOUS
  Filled 2022-11-01: qty 2

## 2022-11-01 NOTE — Consult Note (Addendum)
Neurology Consultation  Reason for Consult: post stroke headache, abnormal CT imaging Referring Physician: Montine Circle, PA-C/April Randal Buba, MD  CC: Chest pain, headaches  History is obtained from: Patient, chart  HPI: Sheri Beck is a 57 y.o. 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 She reports that she has been having chest dim sum for now for a few days.  She also reports of a headache that is been present pretty much since the time of discharge.  It responds to Tylenol 650 mg and is not present all the time.  She reports that today the headache has been a little worse than the prior few days but has still been tolerable.  Head CT was done given her history of recent stroke and hemicraniectomy and it revealed small areas of hemorrhagic transformation in the large right MCA stroke.  Neurological consultation was obtained for headaches and the imaging findings. She does not report any new or worsening findings, on the contrary reports somewhat more of tingling sensation on the left leg whereas earlier it was completely numb.  She reports some itching around the scalp where the staples are. Reports chest discomfort which is nearly present all the time.  Denies any shortness of breath.  Has been bedbound since the stroke. The headache today was not sudden in onset and the headaches have been going on now for multiple days-unclear as to when the last known well might have been.  Headaches are not very severe and have not changed much over the past many days.  LKW: Unclear IV thrombolysis given?: no, recent large stroke with  hemorrhagic transformation Premorbid modified Rankin scale (mRS): 4-5  ROS: Full ROS was performed and is negative except as noted in the HPI.   Past Medical History:  Diagnosis Date   Hypertension    Family History  Problem Relation Age of Onset   Cancer Mother    Heart disease Father    Glaucoma Father    Varicose Veins Paternal Grandfather    Breast cancer Neg Hx    Social History:   reports that she has been smoking cigarettes. She has been smoking an average of .1 packs per day. She has never used smokeless tobacco. She reports current alcohol use. She reports current drug use. Drug: Cocaine.  Medications No current facility-administered medications for this encounter.  Current Outpatient Medications:    amLODipine (NORVASC) 10 MG tablet, Take 1 tablet by mouth once daily, Disp: 90 tablet, Rfl: 0   apixaban (ELIQUIS) 5 MG TABS tablet, Take 1 tablet (5 mg total) by mouth 2 (two) times daily., Disp: 60 tablet, Rfl:    escitalopram (LEXAPRO) 5 MG tablet, Take 1 tablet (5 mg total) by mouth daily., Disp: , Rfl:    lisinopril-hydrochlorothiazide (ZESTORETIC) 20-12.5 MG tablet, Take 1 tablet by mouth once daily, Disp: 90 tablet, Rfl: 0   ondansetron (ZOFRAN) 4 MG tablet, Take 1 tablet (4 mg total) by mouth every 4 (four) hours as needed for nausea or vomiting., Disp: 20 tablet, Rfl: 0   polyethylene glycol (MIRALAX / GLYCOLAX) 17 g packet, Take 17 g by mouth daily as needed for moderate constipation., Disp: 14 each,  Rfl: 0   promethazine (PHENERGAN) 12.5 MG tablet, Take 1-2 tablets (12.5-25 mg total) by mouth every 4 (four) hours as needed for refractory nausea / vomiting., Disp: 30 tablet, Rfl: 0  Exam: Current vital signs: BP 113/70   Pulse 72   Temp 98.3 F (36.8 C) (Oral)   Resp 15   Ht 5\' 2"  (1.575 m)   Wt 92.5 kg   SpO2 96%   BMI 37.31 kg/m  Vital signs in last 24 hours: Temp:  [98.3 F (36.8 C)] 98.3 F (36.8 C) (01/28 2247) Pulse Rate:  [72-77] 72 (01/29  0000) Resp:  [10-15] 15 (01/29 0000) BP: (92-113)/(66-70) 113/70 (01/29 0000) SpO2:  [96 %-100 %] 96 % (01/29 0000) Weight:  [92.5 kg] 92.5 kg (01/28 2142) General: Awake alert in no distress HEENT: Staples on the head, right hemicraniectomy with soft nontender palpation CVS: Regular rhythm Abdomen nondistended nontender Extremities warm well-perfused with trace edema Breathing normally and saturating well on room air Neurological exam Awake alert oriented x 3 Mild dysarthria No evidence of aphasia Cranial nerves: Pupils are equal round reactive to light, she has rightward gaze preference and can only come to the midline but cannot cross midline to look at the left, left homonymous hemianopsia, left lower facial weakness. Motor examination with plegic left upper and lower extremity.  Right-sided full strength Sensation: Severely diminished on the left with extinction on double simultaneous stimulation.  Also has visual extinction to the left on double 17 stimulation.  Intact on the right. Coordination: No dysmetria on the right, unable to perform on the left NIHS 1a Level of Conscious.: 0 1b LOC Questions: 0 1c LOC Commands: 0 2 Best Gaze: 1 3 Visual: 2 4 Facial Palsy: 2 5a Motor Arm - left: 3 5b Motor Arm - Right: 0 6a Motor Leg - Left: 3 6b Motor Leg - Right: 0 7 Limb Ataxia: 0 8 Sensory: 2 9 Best Language: 0 10 Dysarthria: 1 11 Extinct. and Inatten.: 2 TOTAL: 16  Labs I have reviewed labs in epic and the results pertinent to this consultation are: CBC    Component Value Date/Time   WBC 7.4 10/31/2022 2342   RBC 4.60 10/31/2022 2342   HGB 13.5 10/31/2022 2342   HGB 15.0 09/12/2020 1048   HCT 40.8 10/31/2022 2342   HCT 44.6 09/12/2020 1048   PLT 492 (H) 10/31/2022 2342   PLT 289 09/12/2020 1048   MCV 88.7 10/31/2022 2342   MCV 88 09/12/2020 1048   MCH 29.3 10/31/2022 2342   MCHC 33.1 10/31/2022 2342   RDW 13.2 10/31/2022 2342   RDW 12.9 09/12/2020 1048    LYMPHSABS 3.5 10/26/2022 0847   LYMPHSABS 3.0 09/12/2020 1048   MONOABS 1.0 10/26/2022 0847   EOSABS 0.1 10/26/2022 0847   EOSABS 0.2 09/12/2020 1048   BASOSABS 0.1 10/26/2022 0847   BASOSABS 0.1 09/12/2020 1048    CMP     Component Value Date/Time   NA 138 10/26/2022 0847   NA 142 03/31/2018 1054   K 4.4 10/26/2022 0847   CL 104 10/26/2022 0847   CO2 26 10/26/2022 0847   GLUCOSE 106 (H) 10/26/2022 0847   BUN 15 10/26/2022 0847   BUN 14 03/31/2018 1054   CREATININE 0.78 10/26/2022 0847   CALCIUM 9.7 10/26/2022 0847   PROT 6.8 10/26/2022 0847   PROT 7.1 03/31/2018 1054   ALBUMIN 3.2 (L) 10/26/2022 0847   ALBUMIN 4.3 03/31/2018 1054   AST 90 (H) 10/26/2022 10/28/2022  ALT 180 (H) 10/26/2022 0847   ALKPHOS 77 10/26/2022 0847   BILITOT 0.3 10/26/2022 0847   BILITOT 0.4 03/31/2018 1054   GFRNONAA >60 10/26/2022 0847   GFRAA 90 03/31/2018 1054    Lipid Panel     Component Value Date/Time   CHOL 159 10/18/2022 0500   CHOL 207 (H) 03/31/2018 1054   TRIG 116 10/18/2022 0500   HDL 43 10/18/2022 0500   HDL 52 03/31/2018 1054   CHOLHDL 3.7 10/18/2022 0500   VLDL 23 10/18/2022 0500   LDLCALC 93 10/18/2022 0500   LDLCALC 123 (H) 03/31/2018 1054     Imaging I have reviewed the images obtained:  CT-head small areas of hemorrhagic transformation in the large right MCA stroke.  Postoperative changes from hemicraniectomy seen.   Assessment: 57 year old with a large left MCA stroke with residual left hemiplegia left hemianopsia and right gaze preference status post decompressive hemicrania, hypertension, history of cocaine abuse, currently in rehab after the large stroke, presenting for evaluation of chest pain and headache.  CT head with hemorrhagic transformation in the large right MCA infarct. She does not report any sudden changes in her headaches or symptoms that would raise a very strong red flag at this time but given that she is on anticoagulation and has hemorrhagic  transformation, I would recommend a short-term follow-up-head CT in 6 hours and if that is concerning for expansion of the hematoma, then she might need to come in for further management.   Impression Headache in the setting of recent large right MCA stroke status post hemicraniectomy Hemorrhagic transformation in the large right MCA stroke-while on anticoagulation DVT on DOAC  Recommendations: Observe in the ER and repeat head CT in 6 hours at 0500 hrs.-ordered If the CT has any expansion of the hematoma, can bring her in for admission for further evaluation and monitoring. Her blood pressures are within goal-would recommend keeping them below systolic 893 at all times. If the repeat CT head does not show any expansion of the hematoma, she can be discharged to her facility with outpatient follow-up with neurology in the next couple of weeks. Symptomatically, other than the Tylenol that works very well for her, I would recommend giving her 1 dose of IV Compazine and IV fluids for symptomatic management of headache. If there is no expansion of the hemorrhagic transformation, continue with the DOAC for DVT. Plan was discussed with Montine Circle, PA-C  Outpatient neurosurgical follow-up as per discharge summary from earlier this month. Outpatient neurology follow-up in the next couple of weeks-[facility]-with Dr. Leonie Man   Please call back once the repeat head CT is done.   -- Amie Portland, MD Neurologist [facility] Pager: [phone]   Addendum Following up on imaging as advised in consult note. Repeat head CT completed. No expansion of the bleed. Compared this to the scans from before where she had a lot of cerebral edema in the right MCA territory, that has now started to resolve and evacuate, that is why this bleed is likely more prominent.  Ventricles look better.  The shift is not to the right and ex-vacuo changes. Okay to continue Eliquis. Stat CT head  with any neurological change. Follow-up with Dr. Leonie Man in the [facility] neurology clinic in 1 to 2 weeks. Rest of the plan as in the consult note from earlier. Plan relayed to [employer]. PA-c  -- Amie Portland, MD Neurologist [facility] Pager: [phone]

## 2022-11-01 NOTE — ED Notes (Signed)
DC instructions provided to [facility].  Previous RN reported to daughter. Sheri Beck's did not answer x 2 attempts by myself and the previous RN.

## 2022-11-01 NOTE — ED Notes (Signed)
Patient transported to CT 

## 2022-11-01 NOTE — Discharge Instructions (Signed)
Your head CT showed some bleeding around your old stroke.  This did not change with the repeat CT scan.  The neurologist recommends that you follow up with the neurologist.  The neurology office will call you.

## 2022-11-01 NOTE — ED Notes (Signed)
Attempted to call report x1 - no answer

## 2022-11-03 ENCOUNTER — Emergency Department (HOSPITAL_COMMUNITY)
Admission: EM | Admit: 2022-11-03 | Discharge: 2022-11-03 | Disposition: A | Discharge status: Skilled Nursing Facility | Payer: Medicaid Other | Attending: Emergency Medicine | Admitting: Emergency Medicine

## 2022-11-03 ENCOUNTER — Encounter (HOSPITAL_COMMUNITY): Payer: Self-pay

## 2022-11-03 ENCOUNTER — Emergency Department (HOSPITAL_COMMUNITY): Payer: Medicaid Other

## 2022-11-03 DIAGNOSIS — G8192 Hemiplegia, unspecified affecting left dominant side: Secondary | ICD-10-CM | POA: Insufficient documentation

## 2022-11-03 DIAGNOSIS — W06XXXD Fall from bed, subsequent encounter: Secondary | ICD-10-CM | POA: Insufficient documentation

## 2022-11-03 DIAGNOSIS — Z7901 Long term (current) use of anticoagulants: Secondary | ICD-10-CM | POA: Diagnosis not present

## 2022-11-03 DIAGNOSIS — S0101XD Laceration without foreign body of scalp, subsequent encounter: Secondary | ICD-10-CM | POA: Insufficient documentation

## 2022-11-03 DIAGNOSIS — W19XXXA Unspecified fall, initial encounter: Secondary | ICD-10-CM

## 2022-11-03 DIAGNOSIS — Z20822 Contact with and (suspected) exposure to covid-19: Secondary | ICD-10-CM | POA: Diagnosis not present

## 2022-11-03 LAB — RESP PANEL BY RT-PCR (RSV, FLU A&B, COVID)  RVPGX2
Influenza A by PCR: NEGATIVE
Influenza B by PCR: NEGATIVE
Resp Syncytial Virus by PCR: NEGATIVE
SARS Coronavirus 2 by RT PCR: NEGATIVE

## 2022-11-03 NOTE — ED Provider Notes (Signed)
[facility] Provider Note   CSN: [id number] Arrival date & time: 11/03/22  7829     History  Chief Complaint  Patient presents with   Fall    On thinners    Sheri Beck is a 57 y.o. female.   Pt is a 56y/o female with hx of large right MCA infarct on 10/16/2022 requiring craniectomy for decompression. She is hemiparetic on the left with gaze deviation. During hospitalization she developed multiple DVTs and was transitioned from a heparin drip to Eliquis who was in the emergency room yesterday due to headache and had unchanged head CT however is presenting today as a level 2 trauma after falling out of bed.  Patient reports she does not remember falling out of bed but woke up on the floor.  EMS reports her bed was 1 to 2 feet off the floor.  She is currently at a skilled facility for rehab.  Patient has no complaints at this time.  She denies any head pain or headache.  She denies any extremity pain.  She only is complaining about itching where the staples were placed approximately 2 weeks ago.  The history is provided by the patient, the EMS personnel and the nursing home.  Fall       Home Medications Prior to Admission medications   Medication Sig Start Date End Date Taking? Authorizing Provider  amLODipine (NORVASC) 10 MG tablet Take 1 tablet by mouth once daily 10/05/22   Farrel Conners, MD  apixaban (ELIQUIS) 5 MG TABS tablet Take 1 tablet (5 mg total) by mouth 2 (two) times daily. 10/22/22   Viona Gilmore D, NP  escitalopram (LEXAPRO) 5 MG tablet Take 1 tablet (5 mg total) by mouth daily. 10/23/22   Viona Gilmore D, NP  lisinopril-hydrochlorothiazide (ZESTORETIC) 20-12.5 MG tablet Take 1 tablet by mouth once daily 10/05/22   Farrel Conners, MD  ondansetron (ZOFRAN) 4 MG tablet Take 1 tablet (4 mg total) by mouth every 4 (four) hours as needed for nausea or vomiting. 10/22/22   Viona Gilmore D, NP  polyethylene glycol  (MIRALAX / GLYCOLAX) 17 g packet Take 17 g by mouth daily as needed for moderate constipation. 10/22/22   Viona Gilmore D, NP  promethazine (PHENERGAN) 12.5 MG tablet Take 1-2 tablets (12.5-25 mg total) by mouth every 4 (four) hours as needed for refractory nausea / vomiting. 10/22/22   Viona Gilmore D, NP      Allergies    Patient has no known allergies.    Review of Systems   Review of Systems  Physical Exam Updated Vital Signs BP 97/63   Pulse 67   Temp 98 F (36.7 C) (Oral)   Resp 14   Ht 5\' 2"  (1.575 m)   Wt 92.5 kg   SpO2 100%   BMI 37.31 kg/m  Physical Exam Vitals and nursing note reviewed.  Constitutional:      General: She is not in acute distress.    Appearance: Normal appearance.  HENT:     Head:     Comments: Healing craniotomy scar on the right with staples still intact.  No hematomas or tenderness noted to the scalp    Mouth/Throat:     Mouth: Mucous membranes are moist.  Eyes:     Pupils: Pupils are equal, round, and reactive to light.  Cardiovascular:     Rate and Rhythm: Normal rate and regular rhythm.  Pulmonary:     Effort:  Pulmonary effort is normal. No respiratory distress.     Breath sounds: Normal breath sounds.  Abdominal:     General: Abdomen is flat.     Palpations: Abdomen is soft.     Comments: Staples present in the right mid abdomen with some mild irritation from where the staples are placed  Skin:    General: Skin is warm.  Neurological:     Mental Status: She is alert and oriented to person, place, and time.     Comments: Left-sided hemiparesis and neglect with gaze towards the right  Psychiatric:        Mood and Affect: Mood normal.     ED Results / Procedures / Treatments   Labs (all labs ordered are listed, but only abnormal results are displayed) Labs Reviewed  RESP PANEL BY RT-PCR (RSV, FLU A&B, COVID)  [id number]    EKG None  Radiology CT Head Wo Contrast  Result Date: 11/03/2022 CLINICAL DATA:  Head trauma,  coagulopathy (Age 69-64y) EXAM: CT HEAD WITHOUT CONTRAST TECHNIQUE: Contiguous axial images were obtained from the base of the skull through the vertex without intravenous contrast. RADIATION DOSE REDUCTION: This exam was performed according to the departmental dose-optimization program which includes automated exposure control, adjustment of the mA and/or kV according to patient size and/or use of iterative reconstruction technique. COMPARISON:  CT head 11/01/2022. FINDINGS: Brain: Similar right MCA territory encephalomalacia with ipsilateral craniectomy and multifocal areas of petechial hemorrhage within the infarcted brain. No significant mass effect. No new hemorrhage, new large vascular territory infarct, or hydrocephalus. Remote right cerebellar infarct. Vascular: Calcific atherosclerosis. Skull: Right-sided craniectomy. Sinuses/Orbits: Largely clear sinuses.  No acute orbital findings. Other: No mastoid effusions. IMPRESSION: Similar right MCA territory encephalomalacia with ipsilateral craniectomy and multifocal areas of petechial hemorrhage within the infarcted brain. Electronically Signed   By: Margaretha Sheffield M.D.   On: 11/03/2022 09:25    Procedures .Suture Removal  Date/Time: 11/03/2022 9:41 AM  Performed by: Blanchie Dessert, MD Authorized by: Blanchie Dessert, MD   Consent:    Consent obtained:  Verbal   Consent given by:  Patient Universal protocol:    Patient identity confirmed:  Verbally with patient Location:    Location:  Head/neck   Head/neck location:  Scalp (and abdomen) Procedure details:    Wound appearance:  No signs of infection, good wound healing and clean Post-procedure details:    Post-removal:  No dressing applied   Procedure completion:  Tolerated     Medications Ordered in ED Medications - No data to display  ED Course/ Medical Decision Making/ A&P                             Medical Decision Making Amount and/or Complexity of Data  Reviewed Radiology: ordered.   Pt with multiple medical problems and comorbidities and presenting today with a complaint that caries a high risk for morbidity and mortality.  Here today as a level 2 trauma after she fell out of bed at her rehab facility and is currently on Eliquis.  Unclear if patient had a head injury as she does not remember the fall but is denying any head pain.  Head CT to rule out new bleed as patient has known hemorrhage that was unchanged based on CT yesterday.  Patient also has staples that are bothering her.  She reports that they are itchy and have now been present since 10/16/2022 which is over 2 weeks.  Wounds appear to be healing well and some completely healed.  Staples were removed today. I have independently visualized and interpreted pt's images today.  CT head neg for bleed.  Pt stable for d/c back to facility.          Final Clinical Impression(s) / ED Diagnoses Final diagnoses:  Fall, initial encounter    Rx / DC Orders ED Discharge Orders     None         Blanchie Dessert, MD 11/03/22 1017

## 2022-11-03 NOTE — Discharge Instructions (Signed)
No signs of injury from rolling out of bed.  All staples were removed.  Head CT showed no new finding.

## 2022-11-03 NOTE — ED Notes (Signed)
[facility] called, on the way now. Informed RN and Dr.

## 2022-11-03 NOTE — ED Triage Notes (Addendum)
Coming from rehab facility, found on floor, unwitnessed fall from bed, one foot tall. Recent Craniotomy surgery, staples on head and right abdomen, no drainage coming from it. Per facility blood pressure runs in 90's. On assessment pt alert and oriented x4. Does not remember falling. Takes Eliquis, did not take it today.

## 2022-11-04 ENCOUNTER — Other Ambulatory Visit: Payer: Self-pay

## 2022-11-04 ENCOUNTER — Emergency Department (HOSPITAL_COMMUNITY): Payer: Medicaid Other

## 2022-11-04 ENCOUNTER — Emergency Department (HOSPITAL_COMMUNITY)
Admission: EM | Admit: 2022-11-04 | Discharge: 2022-11-05 | Disposition: A | Discharge status: Skilled Nursing Facility | Payer: Medicaid Other | Attending: Emergency Medicine | Admitting: Emergency Medicine

## 2022-11-04 ENCOUNTER — Encounter (HOSPITAL_COMMUNITY): Payer: Self-pay | Admitting: Emergency Medicine

## 2022-11-04 DIAGNOSIS — I10 Essential (primary) hypertension: Secondary | ICD-10-CM | POA: Diagnosis not present

## 2022-11-04 DIAGNOSIS — T1491XA Suicide attempt, initial encounter: Secondary | ICD-10-CM | POA: Diagnosis present

## 2022-11-04 DIAGNOSIS — S0990XA Unspecified injury of head, initial encounter: Secondary | ICD-10-CM | POA: Diagnosis not present

## 2022-11-04 DIAGNOSIS — Z79899 Other long term (current) drug therapy: Secondary | ICD-10-CM | POA: Diagnosis not present

## 2022-11-04 DIAGNOSIS — Z7901 Long term (current) use of anticoagulants: Secondary | ICD-10-CM | POA: Insufficient documentation

## 2022-11-04 DIAGNOSIS — R45851 Suicidal ideations: Secondary | ICD-10-CM

## 2022-11-04 DIAGNOSIS — I639 Cerebral infarction, unspecified: Secondary | ICD-10-CM | POA: Diagnosis present

## 2022-11-04 DIAGNOSIS — W06XXXA Fall from bed, initial encounter: Secondary | ICD-10-CM | POA: Insufficient documentation

## 2022-11-04 LAB — CBC WITH DIFFERENTIAL/PLATELET
Abs Immature Granulocytes: 0.01 10*3/uL (ref 0.00–0.07)
Basophils Absolute: 0.1 10*3/uL (ref 0.0–0.1)
Basophils Relative: 1 %
Eosinophils Absolute: 0.1 10*3/uL (ref 0.0–0.5)
Eosinophils Relative: 1 %
HCT: 40.2 % (ref 36.0–46.0)
Hemoglobin: 13.6 g/dL (ref 12.0–15.0)
Immature Granulocytes: 0 %
Lymphocytes Relative: 41 %
Lymphs Abs: 2.5 10*3/uL (ref 0.7–4.0)
MCH: 29.8 pg (ref 26.0–34.0)
MCHC: 33.8 g/dL (ref 30.0–36.0)
MCV: 88 fL (ref 80.0–100.0)
Monocytes Absolute: 0.5 10*3/uL (ref 0.1–1.0)
Monocytes Relative: 8 %
Neutro Abs: 3 10*3/uL (ref 1.7–7.7)
Neutrophils Relative %: 49 %
Platelets: 372 10*3/uL (ref 150–400)
RBC: 4.57 MIL/uL (ref 3.87–5.11)
RDW: 12.8 % (ref 11.5–15.5)
WBC: 6.1 10*3/uL (ref 4.0–10.5)
nRBC: 0 % (ref 0.0–0.2)

## 2022-11-04 LAB — BASIC METABOLIC PANEL
Anion gap: 8 (ref 5–15)
BUN: 26 mg/dL — ABNORMAL HIGH (ref 6–20)
CO2: 25 mmol/L (ref 22–32)
Calcium: 9.7 mg/dL (ref 8.9–10.3)
Chloride: 105 mmol/L (ref 98–111)
Creatinine, Ser: 0.81 mg/dL (ref 0.44–1.00)
GFR, Estimated: >60 mL/min (ref >60)
Glucose, Bld: 106 mg/dL — ABNORMAL HIGH (ref 70–99)
Potassium: 3.6 mmol/L (ref 3.5–5.1)
Sodium: 138 mmol/L (ref 135–145)

## 2022-11-04 LAB — RAPID URINE DRUG SCREEN, HOSP PERFORMED
Amphetamines: NOT DETECTED
Barbiturates: NOT DETECTED
Benzodiazepines: NOT DETECTED
Cocaine: NOT DETECTED
Opiates: NOT DETECTED
Tetrahydrocannabinol: NOT DETECTED

## 2022-11-04 LAB — PROTIME-INR
INR: 1.2 (ref 0.8–1.2)
Prothrombin Time: 14.8 seconds (ref 11.4–15.2)

## 2022-11-04 LAB — ETHANOL: Alcohol, Ethyl (B): <10 mg/dL (ref <10)

## 2022-11-04 LAB — APTT: aPTT: 23 seconds — ABNORMAL LOW (ref 24–36)

## 2022-11-04 MED ORDER — ACETAMINOPHEN 500 MG PO TABS
1000.0000 mg | ORAL_TABLET | Freq: Once | ORAL | Status: AC
  Administered 2022-11-04: 1000 mg via ORAL
  Filled 2022-11-04: qty 2

## 2022-11-04 MED ORDER — ACETAMINOPHEN 325 MG PO TABS
650.0000 mg | ORAL_TABLET | Freq: Once | ORAL | Status: AC
  Administered 2022-11-04: 650 mg via ORAL
  Filled 2022-11-04: qty 2

## 2022-11-04 NOTE — ED Notes (Signed)
Patient transported to CT 

## 2022-11-04 NOTE — ED Notes (Signed)
Patient turned, cleaned, and brief changed. 

## 2022-11-04 NOTE — ED Notes (Addendum)
Staffing called about getting 1 to 1 sitter for patient.  Gayle in staffing office stated they currently do not have any sitters to send.  Gayle aware of order placed for patient.  Charge RN [place] notified and aware.  No new orders at this time.

## 2022-11-04 NOTE — Progress Notes (Signed)
Neurosurgery  I reviewed the CT head without contrast from today. It appears her intracranial hemorrhages are showing expected evolution and are resolving. I do not see any worsening of any extracranial herniation.  No active neurosurgical issue is seen. If patient is actively trying to fall, anticoagulation is likely more harmful than beneficial, and she would benefit from wearing a fitted helmet.

## 2022-11-04 NOTE — ED Notes (Signed)
Ptar called 

## 2022-11-04 NOTE — ED Triage Notes (Signed)
Pt BIB [facility] from [facility] nursing home due to suicidal attempts.  Pt is consistently trying to get out of bed and fall to hit head as she in on blood thinners.  Pt was found with call bell cord around neck and has pulled her sutures from her stomach to open it up.  Pt takes Eliquis for A-fib. VSS.

## 2022-11-04 NOTE — ED Notes (Signed)
Report given to Merchandiser, retail at [facility] facility.

## 2022-11-04 NOTE — ED Provider Notes (Signed)
[facility] Provider Note   CSN: [id number] Arrival date & time: 11/04/22  7989     History  Chief Complaint  Patient presents with   Suicidal   Hernia    Worsening head bleed    Sheri Beck is a 57 y.o. female.  Patient is a 57 year old female with past medical history of hypertension and CVA presenting from [place] for SI.  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  She states her children were unable to take care of her at that time and she was sent to a skilled rehab facility.  Yesterday, on 11/03/2022, she presented to the emergency department after falling out of bed with blunt head trauma on Eliquis for A-fib.  She was discharged safely home.  She is presenting again this morning for suicidal attempts.  Nursing staff states that the patient has had multiple suicide attempts including trying to fall and hit her head purposely.  They states she placed the call bell cord around her neck.  They also state that she pulled out the sutures in her abdominal incision.  On my exam patient is quiet and will not answer any questions about suicidal ideation or attempts.  The history is provided by the patient. No language interpreter was used.       Home Medications Prior to Admission medications   Medication Sig Start Date End Date Taking? Authorizing Provider  amLODipine (NORVASC) 10 MG tablet Take 1 tablet by mouth once daily 10/05/22  Yes Farrel Conners, MD  apixaban (ELIQUIS) 5 MG TABS tablet Take 1 tablet (5 mg total) by mouth 2 (two) times daily. 10/22/22  Yes Viona Gilmore D, NP  docusate sodium (COLACE) 100 MG capsule Take 100 mg by mouth 2 (two) times daily.   Yes [provider]  escitalopram (LEXAPRO) 5 MG tablet Take 1 tablet (5 mg total) by mouth daily. 10/23/22  Yes Viona Gilmore D, NP   lisinopril-hydrochlorothiazide (ZESTORETIC) 20-12.5 MG tablet Take 1 tablet by mouth once daily 10/05/22  Yes Farrel Conners, MD  ondansetron (ZOFRAN) 4 MG tablet Take 1 tablet (4 mg total) by mouth every 4 (four) hours as needed for nausea or vomiting. 10/22/22  Yes Viona Gilmore D, NP  polyethylene glycol (MIRALAX / GLYCOLAX) 17 g packet Take 17 g by mouth daily as needed for moderate constipation. 10/22/22  Yes Viona Gilmore D, NP  promethazine (PHENERGAN) 12.5 MG tablet Take 1-2 tablets (12.5-25 mg total) by mouth every 4 (four) hours as needed for refractory nausea / vomiting. 10/22/22  Yes Viona Gilmore D, NP  senna (SENOKOT) 8.6 MG tablet Take 1-2 tablets by mouth at bedtime as needed for constipation.   Yes [provider]      Allergies    Patient has no known allergies.    Review of Systems   Review of Systems  Constitutional:  Negative for chills and fever.  HENT:  Negative for ear pain and sore throat.   Eyes:  Negative for pain and visual disturbance.  Respiratory:  Negative for cough and shortness of breath.   Cardiovascular:  Negative for chest pain and palpitations.  Gastrointestinal:  Negative for abdominal pain and vomiting.  Genitourinary:  Negative for dysuria and hematuria.  Musculoskeletal:  Negative for arthralgias and back pain.  Skin:  Negative for color change and rash.  Neurological:  Negative  for seizures and syncope.  Psychiatric/Behavioral:  Positive for self-injury and suicidal ideas.   All other systems reviewed and are negative.   Physical Exam Updated Vital Signs BP 104/77 (BP Location: Right Arm)   Pulse 74   Temp 98.7 F (37.1 C) (Oral)   Resp 16   Ht 5\' 2"  (1.575 m)   Wt 92.5 kg   SpO2 100%   BMI 37.30 kg/m  Physical Exam Vitals and nursing note reviewed.  Constitutional:      General: She is not in acute distress.    Appearance: She is well-developed.  HENT:     Head: Normocephalic and atraumatic.  Eyes:      Conjunctiva/sclera: Conjunctivae normal.  Cardiovascular:     Rate and Rhythm: Normal rate and regular rhythm.     Heart sounds: No murmur heard. Pulmonary:     Effort: Pulmonary effort is normal. No respiratory distress.     Breath sounds: Normal breath sounds.  Abdominal:     Palpations: Abdomen is soft.     Tenderness: There is no abdominal tenderness.    Musculoskeletal:        General: No swelling.     Cervical back: Neck supple.  Skin:    General: Skin is warm and dry.     Capillary Refill: Capillary refill takes less than 2 seconds.  Neurological:     Mental Status: She is alert.  Psychiatric:        Mood and Affect: Mood normal.        Thought Content: Thought content includes suicidal ideation. Thought content includes suicidal plan.     ED Results / Procedures / Treatments   Labs (all labs ordered are listed, but only abnormal results are displayed) Labs Reviewed  BASIC METABOLIC PANEL - Abnormal; Notable for the following components:      Result Value   Glucose, Bld 106 (*)    BUN 26 (*)    All other components within normal limits  APTT - Abnormal; Notable for the following components:   aPTT 23 (*)    All other components within normal limits  RAPID URINE DRUG SCREEN, HOSP PERFORMED  ETHANOL  CBC WITH DIFFERENTIAL/PLATELET  PROTIME-INR    EKG None  Radiology CT Head Wo Contrast  Result Date: 11/04/2022 CLINICAL DATA:  Head trauma, coagulopathy. Blunt head trauma on blood thinners. EXAM: CT HEAD WITHOUT CONTRAST TECHNIQUE: Contiguous axial images were obtained from the base of the skull through the vertex without intravenous contrast. RADIATION DOSE REDUCTION: This exam was performed according to the departmental dose-optimization program which includes automated exposure control, adjustment of the mA and/or kV according to patient size and/or use of iterative reconstruction technique. COMPARISON:  Head CT 11/03/2022. FINDINGS: Brain: Redemonstrated  postoperative changes of right hemicraniectomy. Slight interval progression of hemorrhagic contusion within the right cerebral hemisphere (axial image 15 series 100) with associated slight increase in external herniation. Ex vacuo dilatation of the right lateral ventricle. No midline shift. Remote lacunar infarct in the right cerebellar hemisphere. Severe underlying chronic small-vessel disease. Vascular: No hyperdense vessel or unexpected calcification. Skull: Right hemi craniectomy.  Skull base is unremarkable. Sinuses/Orbits: Paranasal sinuses, mastoid air cells, and middle ear cavities are well aerated. Orbits are unremarkable. Other: None. IMPRESSION: Slight interval progression of hemorrhagic contusion within the right cerebral hemisphere with associated slight increase in external herniation. No midline shift. Electronically Signed   By: Emmit Alexanders M.D.   On: 11/04/2022 10:45   CT Head Wo Contrast  Result Date: 11/03/2022 CLINICAL DATA:  Head trauma, coagulopathy (Age 53-64y) EXAM: CT HEAD WITHOUT CONTRAST TECHNIQUE: Contiguous axial images were obtained from the base of the skull through the vertex without intravenous contrast. RADIATION DOSE REDUCTION: This exam was performed according to the departmental dose-optimization program which includes automated exposure control, adjustment of the mA and/or kV according to patient size and/or use of iterative reconstruction technique. COMPARISON:  CT head 11/01/2022. FINDINGS: Brain: Similar right MCA territory encephalomalacia with ipsilateral craniectomy and multifocal areas of petechial hemorrhage within the infarcted brain. No significant mass effect. No new hemorrhage, new large vascular territory infarct, or hydrocephalus. Remote right cerebellar infarct. Vascular: Calcific atherosclerosis. Skull: Right-sided craniectomy. Sinuses/Orbits: Largely clear sinuses.  No acute orbital findings. Other: No mastoid effusions. IMPRESSION: Similar right MCA  territory encephalomalacia with ipsilateral craniectomy and multifocal areas of petechial hemorrhage within the infarcted brain. Electronically Signed   By: Margaretha Sheffield M.D.   On: 11/03/2022 09:25    Procedures Procedures    Medications Ordered in ED Medications  acetaminophen (TYLENOL) tablet 650 mg (650 mg Oral Given 11/04/22 1204)    ED Course/ Medical Decision Making/ A&P                             Medical Decision Making Amount and/or Complexity of Data Reviewed Labs: ordered. Radiology: ordered.  Risk OTC drugs.   42:58 PM 57 year old female with past medical history of hypertension and CVA presenting from [place] for multiple suicide attempts.   CT head demonstrates:  Slight interval progression of hemorrhagic contusion within the right cerebral hemisphere with associated slight increase in external herniation. No midline shift.  Consult to neurosurgery placed. Previous craniectomy by Dr. Arnoldo Morale 10/16/22.   Ethanol and UDS wnl.  Labs pending.  5:52 PM Neurosurgery has reviewed CT scan and believes CTH is stable and images are likely reflecting a resolving hemorrhage. Recommends safe for medical clearance from cranial stand point.   Patient cleared by TTS for DC back to rehab facility. I have reached out regarding patient safety due to suicide attempts. Patient signed out to oncoming ED physician Dr. Tinnie Gens while waiting call back from [facility] physician.         Final Clinical Impression(s) / ED Diagnoses Final diagnoses:  Suicidal ideation  Suicide attempt [facility])    Rx / DC Orders ED Discharge Orders     None         Lianne Cure, DO 00/86/76 1752

## 2022-11-04 NOTE — ED Notes (Signed)
Per DO Pearline Cables pt should be sitting in a upright position.

## 2022-11-04 NOTE — Progress Notes (Signed)
Per provider Earney Mallet, NP pt has been psych cleared. This CSW will now remove pt from the [facility] shift report. TOC will assist with any discharge needs.   Benjaman Kindler, MSW, [facility] 11/04/2022 11:24 PM

## 2022-11-04 NOTE — ED Notes (Signed)
This RN attempted to call report to

## 2022-11-04 NOTE — ED Notes (Signed)
PT ISN'T IVC FOR NOW VOLUNTARY CONSENT FORM ATTACHED TO CLIPBOARD IN OR[place] ZONE

## 2022-11-04 NOTE — Consult Note (Signed)
[facility] Psychiatry Consult   Reason for Consult: Suicidal ideation Referring Physician:  Lianne Cure, DO   Patient Identification: Sheri Beck MRN:  [id number] Principal Diagnosis: Suicidal ideation Diagnosis:  Principal Problem:   Suicidal ideation Active Problems:   Stroke [facility])   Total Time spent with patient: 45 minutes  Subjective:   Sheri Beck is a 57 y.o. female patient admitted to [facility] due to suicidal attempt, patient was found with a call bell cord around her neck.   HPI: Sheri Beck is a 57 year old female patient who was brought to [facility] ED from [place] home due to suicidal attempt, she was found with call bell cord around her neck in an attempt to harm herself.    Patient was seen face to face by this provider and chart reviewed.   On evaluation patient is alert and oriented x3, speech is clear and coherent. Patient's eye contact is fair, mood is euthymic, affect is congruent. Patient's thought process is goal directed and thought content is within normal limits. Patient denies SI HI, denies AVH, or paranoia. There is no indication that the patient is responding to internal stimuli and no delusion noted.    Patient reported that she had a massive stroke in January 11 and she had to do surgery after which she was transferred to the nursing home facility where she cannot do anything by herself.   Patient stated that "I am here because I tried to commit suicide, was feeling depressed this morning".   Patient reported that I would like to get up and get a bath but I cannot; she added that she has not taken shower since she got to the nursing facility. Patient said she has a BF and a daughter, she said that her house has stairs but her daughter's house does not. She said that she can go back to her daughter's house if she lets her. Patient reported that she used to be independent but angry because she just cannot do anything for herself. Patient  reported that she uses crack cocaine and the last time she used  was October 14, 2022 which was the day she had stroke. She says she drinks alcohol regularly and the last time she used drank October 14, 2022.  Patient denies having a psychiatrist or being in a mental health hospital before.  Patient has a physical therapist due to stroke history.  [name] continues to say that she just want to take shower and go to her daughter's house if she will let her. Patient denies suicidal ideation at this time, she contracts for safety.   Patient gave provider consent to call daughter Fernanda Drum [phone]).  Daughter reported that her mother had a stroke and after surgery she was transferred to the nursing home so she can get physical therapy. Daughter says, mother has been at the facility for 2 weeks and she was supposed to be there for 90 days. Daughter says she can go back to the nursing facility after discharge from [facility].  Support, encouragement and reassurance provided about ongoing stressors and patient provided with opportunity for questions.     Patient does not meet inpatient psychiatry admission criteria and there is no evidence of imminent danger to self or others.      Past Psychiatric History: Major depressive disorder, adjustment disorder with depressive mood, cocaine dependence, tobacco use, alcohol use.   Risk to Self: No  Risk to Others: No  Prior Inpatient Therapy:  No Prior  Outpatient Therapy: No   Past Medical History:  Past Medical History:  Diagnosis Date   Hypertension     Past Surgical History:  Procedure Laterality Date   CRANIOTOMY Right 10/16/2022   Procedure: RIGHT CRANIECTOMY;  Surgeon: Newman Pies, MD;  Location: [facility];  Service: Neurosurgery;  Laterality: Right;   Family History:  Family History  Problem Relation Age of Onset   Cancer Mother    Heart disease Father    Glaucoma Father    Varicose Veins Paternal Grandfather    Breast cancer Neg Hx     Family Psychiatric  History: Not reported Social History:  Social History   Substance and Sexual Activity  Alcohol Use Yes   Comment: occasional     Social History   Substance and Sexual Activity  Drug Use Yes   Types: Cocaine   Comment: Pt states last use was 10/15/22    Social History   Socioeconomic History   Marital status: Single    Spouse name: Not on file   Number of children: Not on file   Years of education: Not on file   Highest education level: Not on file  Occupational History   Not on file  Tobacco Use   Smoking status: Every Day    Packs/day: 0.10    Types: Cigarettes   Smokeless tobacco: Never  Vaping Use   Vaping Use: Never used  Substance and Sexual Activity   Alcohol use: Yes    Comment: occasional   Drug use: Yes    Types: Cocaine    Comment: Pt states last use was 10/15/22   Sexual activity: Yes    Birth control/protection: None  Other Topics Concern   Not on file  Social History Narrative   Not on file   Social Determinants of Health   Financial Resource Strain: Not on file  Food Insecurity: No Food Insecurity (10/20/2022)   Hunger Vital Sign    Worried About Running Out of Food in the Last Year: Never true    Ran Out of Food in the Last Year: Never true  Transportation Needs: No Transportation Needs (10/20/2022)   PRAPARE - Hydrologist (Medical): No    Lack of Transportation (Non-Medical): No  Physical Activity: Not on file  Stress: Not on file  Social Connections: Not on file   Additional Social History:    Allergies:  No Known Allergies  Labs:  Results for orders placed or performed during the hospital encounter of 11/03/22 (from the past 48 hour(s))  Resp panel by RT-PCR (RSV, Flu A&B, Covid) Anterior Nasal Swab     Status: None   Collection Time: 11/03/22 10:05 AM   Specimen: Anterior Nasal Swab  Result Value Ref Range   SARS Coronavirus 2 by RT PCR NEGATIVE NEGATIVE   Influenza A by PCR  NEGATIVE NEGATIVE   Influenza B by PCR NEGATIVE NEGATIVE    Comment: (NOTE) The Xpert Xpress SARS-CoV-2/FLU/RSV plus assay is intended as an aid in the diagnosis of influenza from Nasopharyngeal swab specimens and should not be used as a sole basis for treatment. Nasal washings and aspirates are unacceptable for Xpert Xpress SARS-CoV-2/FLU/RSV testing.  Fact Sheet for Patients: EntrepreneurPulse.com.au  Fact Sheet for Healthcare Providers: IncredibleEmployment.be  This test is not yet approved or cleared by the [place] FDA and has been authorized for detection and/or diagnosis of SARS-CoV-2 by FDA under an Emergency Use Authorization (EUA). This EUA will remain in effect (meaning this  test can be used) for the duration of the COVID-19 declaration under Section 564(b)(1) of the Act, 21 U.S.C. section 360bbb-3(b)(1), unless the authorization is terminated or revoked.     Resp Syncytial Virus by PCR NEGATIVE NEGATIVE    Comment: (NOTE) Fact Sheet for Patients: EntrepreneurPulse.com.au  Fact Sheet for Healthcare Providers: IncredibleEmployment.be  This test is not yet approved or cleared by the [place] FDA and has been authorized for detection and/or diagnosis of SARS-CoV-2 by FDA under an Emergency Use Authorization (EUA). This EUA will remain in effect (meaning this test can be used) for the duration of the COVID-19 declaration under Section 564(b)(1) of the Act, 21 U.S.C. section 360bbb-3(b)(1), unless the authorization is terminated or revoked.  Performed at [facility], [place] [address], [place], [place] [address]     No current facility-administered medications for this encounter.   Current Outpatient Medications  Medication Sig Dispense Refill   amLODipine (NORVASC) 10 MG tablet Take 1 tablet by mouth once daily 90 tablet 0   apixaban (ELIQUIS) 5 MG TABS tablet Take 1 tablet (5  mg total) by mouth 2 (two) times daily. 60 tablet    escitalopram (LEXAPRO) 5 MG tablet Take 1 tablet (5 mg total) by mouth daily.     lisinopril-hydrochlorothiazide (ZESTORETIC) 20-12.5 MG tablet Take 1 tablet by mouth once daily 90 tablet 0   ondansetron (ZOFRAN) 4 MG tablet Take 1 tablet (4 mg total) by mouth every 4 (four) hours as needed for nausea or vomiting. 20 tablet 0   polyethylene glycol (MIRALAX / GLYCOLAX) 17 g packet Take 17 g by mouth daily as needed for moderate constipation. 14 each 0   promethazine (PHENERGAN) 12.5 MG tablet Take 1-2 tablets (12.5-25 mg total) by mouth every 4 (four) hours as needed for refractory nausea / vomiting. 30 tablet 0    Musculoskeletal: Strength & Muscle Tone:  Patient has stroke, has left-sided weakness Gait & Station:  Patient has stroke, has left-sided weakness Patient leans: N/A   Psychiatric Specialty Exam:  Presentation  General Appearance: No data recorded Eye Contact:No data recorded Speech:No data recorded Speech Volume:No data recorded Handedness:No data recorded  Mood and Affect  Mood:No data recorded Affect:No data recorded  Thought Process  Thought Processes:No data recorded Descriptions of Associations:No data recorded Orientation:No data recorded Thought Content:No data recorded History of Schizophrenia/Schizoaffective disorder:No data recorded Duration of Psychotic Symptoms:No data recorded Hallucinations:No data recorded Ideas of Reference:No data recorded Suicidal Thoughts:No data recorded Homicidal Thoughts:No data recorded  Sensorium  Memory:No data recorded Judgment:No data recorded Insight:No data recorded  Executive Functions  Concentration:No data recorded Attention Span:No data recorded Recall:No data recorded Fund of Knowledge:No data recorded Language:No data recorded  Psychomotor Activity  Psychomotor Activity:No data recorded  Assets  Assets: Communication Skills; Desire for  Improvement; Financial Resources/Insurance; Housing; Intimacy; Resilience; Social Support; Vocational/Educational   Sleep  Sleep:No data recorded  Physical Exam: Physical Exam Vitals and nursing note reviewed.  HENT:     Nose: No congestion.  Eyes:     General:        Right eye: No discharge.        Left eye: No discharge.  Pulmonary:     Effort: No respiratory distress.     Breath sounds: No wheezing.  Musculoskeletal:     Comments: Patient has stroke, patient has left-sided weakness  Neurological:     Mental Status: She is alert and oriented to person, place, and time.     Motor:  Weakness present.  Psychiatric:        Mood and Affect: Mood normal.        Speech: Speech normal.        Behavior: Behavior normal.        Thought Content: Thought content is delusional. Thought content is not paranoid. Thought content does not include homicidal or suicidal ideation.    Review of Systems  Constitutional:  Negative for fever.  HENT:  Negative for ear discharge.   Respiratory:  Negative for cough, shortness of breath and wheezing.   Neurological:  Negative for dizziness, seizures and weakness.  Psychiatric/Behavioral:  Positive for depression and substance abuse. Negative for hallucinations and suicidal ideas.    Blood pressure 104/77, pulse 74, temperature 98.7 F (37.1 C), temperature source Oral, resp. rate 16, height 5\' 2"  (1.575 m), weight 92.5 kg, SpO2 100 %. Body mass index is 37.3 kg/m.  Treatment Plan Summary: Plan : Patient will be discharged back to the skilled nursing facility after medically cleared.   Disposition: No evidence of imminent risk to self or others at present.   Patient does not meet criteria for psychiatric inpatient admission. Supportive therapy provided about ongoing stressors. Discussed crisis plan, support from social network, calling 911, coming to the Emergency Department, and calling Suicide Hotline.  Patient is psych cleared and will be  discharged back to the skilled nursing facility after medically cleared.   , NP 11/04/2022 11:07 AM

## 2022-11-05 NOTE — ED Provider Notes (Signed)
  Physical Exam  BP 135/80   Pulse 66   Temp 98.4 F (36.9 C) (Oral)   Resp 20   Ht 5\' 2"  (1.575 m)   Wt 92.5 kg   SpO2 100%   BMI 37.30 kg/m   Physical Exam Vitals and nursing note reviewed.  Constitutional:      General: She is not in acute distress.    Appearance: She is well-developed.  HENT:     Head: Normocephalic and atraumatic.  Eyes:     Conjunctiva/sclera: Conjunctivae normal.  Cardiovascular:     Rate and Rhythm: Normal rate and regular rhythm.     Heart sounds: No murmur heard. Pulmonary:     Effort: Pulmonary effort is normal. No respiratory distress.  Musculoskeletal:        General: No swelling.     Cervical back: Neck supple.  Skin:    General: Skin is warm and dry.     Capillary Refill: Capillary refill takes less than 2 seconds.  Neurological:     Mental Status: She is alert.  Psychiatric:        Mood and Affect: Mood normal.     Procedures  Procedures  ED Course / MDM    Medical Decision Making Amount and/or Complexity of Data Reviewed Labs: ordered. Radiology: ordered.  Risk OTC drugs.   Patient received an handoff.  Suicidal ideation and concerning behavior at her nursing facility.  The previous provider did have concern about the patient's active attempt on her life using a phone cord to try and hang herself.  This psychiatric team evaluated the patient and psychiatrically cleared her and I did spend greater than 6 hours attempting to reach back out to the psychiatric team to clarify their medical decision making as the specific action was not listed in their notes.  I received no response from psychiatry and thus reevaluated the patient myself.  On my evaluation, patient states that earlier today she felt like her depression worsened and in that brief moment she did have thoughts of wanting to die, but since she has been in the emergency department, she currently does not feel that way.  She states that she has been falling out of bed  because the beds do not have guardrails and she has not been intentionally falling out of bed.  She states that today's event was done in frustration with the facility staff and was not really an attempt to end her life.  At this time I do not have any legal basis to keep the patient here in the emergency department against her will and with no active suicidal or homicidal ideation, patient will be discharged with outpatient psychiatric follow-up.       Teressa Lower, MD 11/05/22 [phone]

## 2022-11-09 ENCOUNTER — Emergency Department (HOSPITAL_COMMUNITY): Payer: Medicaid Other

## 2022-11-09 ENCOUNTER — Other Ambulatory Visit: Payer: Self-pay

## 2022-11-09 ENCOUNTER — Emergency Department (HOSPITAL_COMMUNITY)
Admission: EM | Admit: 2022-11-09 | Discharge: 2022-11-09 | Disposition: A | Discharge status: Home / Self Care | Payer: Medicaid Other | Attending: Emergency Medicine | Admitting: Emergency Medicine

## 2022-11-09 DIAGNOSIS — I69354 Hemiplegia and hemiparesis following cerebral infarction affecting left non-dominant side: Secondary | ICD-10-CM | POA: Diagnosis not present

## 2022-11-09 DIAGNOSIS — Z79899 Other long term (current) drug therapy: Secondary | ICD-10-CM | POA: Diagnosis not present

## 2022-11-09 DIAGNOSIS — R55 Syncope and collapse: Secondary | ICD-10-CM

## 2022-11-09 DIAGNOSIS — I10 Essential (primary) hypertension: Secondary | ICD-10-CM

## 2022-11-09 DIAGNOSIS — Z7901 Long term (current) use of anticoagulants: Secondary | ICD-10-CM | POA: Insufficient documentation

## 2022-11-09 DIAGNOSIS — Z86711 Personal history of pulmonary embolism: Secondary | ICD-10-CM | POA: Diagnosis not present

## 2022-11-09 DIAGNOSIS — R079 Chest pain, unspecified: Secondary | ICD-10-CM | POA: Diagnosis present

## 2022-11-09 LAB — COMPREHENSIVE METABOLIC PANEL
ALT: 93 U/L — ABNORMAL HIGH (ref 0–44)
AST: 50 U/L — ABNORMAL HIGH (ref 15–41)
Albumin: 3.3 g/dL — ABNORMAL LOW (ref 3.5–5.0)
Alkaline Phosphatase: 80 U/L (ref 38–126)
Anion gap: 10 (ref 5–15)
BUN: 32 mg/dL — ABNORMAL HIGH (ref 6–20)
CO2: 26 mmol/L (ref 22–32)
Calcium: 9.7 mg/dL (ref 8.9–10.3)
Chloride: 103 mmol/L (ref 98–111)
Creatinine, Ser: 0.87 mg/dL (ref 0.44–1.00)
GFR, Estimated: >60 mL/min (ref >60)
Glucose, Bld: 137 mg/dL — ABNORMAL HIGH (ref 70–99)
Potassium: 3.2 mmol/L — ABNORMAL LOW (ref 3.5–5.1)
Sodium: 139 mmol/L (ref 135–145)
Total Bilirubin: 0.3 mg/dL (ref 0.3–1.2)
Total Protein: 6.5 g/dL (ref 6.5–8.1)

## 2022-11-09 LAB — CBC WITH DIFFERENTIAL/PLATELET
Abs Immature Granulocytes: 0.01 10*3/uL (ref 0.00–0.07)
Basophils Absolute: 0 10*3/uL (ref 0.0–0.1)
Basophils Relative: 1 %
Eosinophils Absolute: 0.4 10*3/uL (ref 0.0–0.5)
Eosinophils Relative: 5 %
HCT: 39.7 % (ref 36.0–46.0)
Hemoglobin: 13.4 g/dL (ref 12.0–15.0)
Immature Granulocytes: 0 %
Lymphocytes Relative: 46 %
Lymphs Abs: 3.5 10*3/uL (ref 0.7–4.0)
MCH: 29.8 pg (ref 26.0–34.0)
MCHC: 33.8 g/dL (ref 30.0–36.0)
MCV: 88.4 fL (ref 80.0–100.0)
Monocytes Absolute: 0.6 10*3/uL (ref 0.1–1.0)
Monocytes Relative: 8 %
Neutro Abs: 2.9 10*3/uL (ref 1.7–7.7)
Neutrophils Relative %: 40 %
Platelets: 277 10*3/uL (ref 150–400)
RBC: 4.49 MIL/uL (ref 3.87–5.11)
RDW: 12.8 % (ref 11.5–15.5)
WBC: 7.4 10*3/uL (ref 4.0–10.5)
nRBC: 0 % (ref 0.0–0.2)

## 2022-11-09 LAB — URINALYSIS, ROUTINE W REFLEX MICROSCOPIC
Bilirubin Urine: NEGATIVE
Glucose, UA: NEGATIVE mg/dL
Ketones, ur: NEGATIVE mg/dL
Nitrite: NEGATIVE
Protein, ur: NEGATIVE mg/dL
Specific Gravity, Urine: 1.033 — ABNORMAL HIGH (ref 1.005–1.030)
pH: 5 (ref 5.0–8.0)

## 2022-11-09 LAB — LACTIC ACID, PLASMA
Lactic Acid, Venous: 2.2 mmol/L (ref 0.5–1.9)
Lactic Acid, Venous: 1.9 mmol/L (ref 0.5–1.9)

## 2022-11-09 LAB — PROTIME-INR
INR: 1.2 (ref 0.8–1.2)
Prothrombin Time: 15.3 seconds — ABNORMAL HIGH (ref 11.4–15.2)

## 2022-11-09 LAB — TROPONIN I (HIGH SENSITIVITY)
Troponin I (High Sensitivity): 4 ng/L (ref <18)
Troponin I (High Sensitivity): 4 ng/L (ref <18)

## 2022-11-09 MED ORDER — AMLODIPINE BESYLATE 10 MG PO TABS: 5.0000 mg | ORAL_TABLET | Freq: Every day | ORAL | 0 refills | Status: DC

## 2022-11-09 MED ORDER — SODIUM CHLORIDE 0.9 % IV BOLUS
500.0000 mL | Freq: Once | INTRAVENOUS | Status: AC
  Administered 2022-11-09: 500 mL via INTRAVENOUS

## 2022-11-09 MED ORDER — HYDROXYZINE HCL 25 MG PO TABS: 25.0000 mg | ORAL_TABLET | Freq: Every evening | ORAL | 0 refills | Status: DC | PRN

## 2022-11-09 NOTE — Discharge Instructions (Addendum)
Your blood pressure was slightly low today.  Your amlodipine (Norvasc) needs to be decreased to 5 mg daily.    Be sure to drink plenty of fluids.  Please follow-up with your doctor regarding an medication for sleep and anxiety.  Will prescribe a medication for you to try for the next week.

## 2022-11-09 NOTE — ED Provider Notes (Signed)
[facility] Provider Note   CSN: [id number] Arrival date & time: 11/09/22  0003     History  Chief Complaint  Patient presents with   Chest Pain    Pt from rehab facility with c/o chest pain and tingling in the right hand and leg. Hx of stroke x1 month. Pt states she felt like her speech was slurred and '' seen stars" when she woke up an hour ago.     Sheri Beck is a 57 y.o. female.  The history is provided by the patient and medical records.  Chest Pain Sheri Beck is a 57 y.o. female who presents to the Emergency Department complaining of slurred speech.  She states that she woke from sleep thinking she had a stroke at 930.  She was seeing stars and having slurring in her speech and felt her chest was somewhat tight.  No associated fever, cough, shortness of breath, nausea, vomiting.  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  She states that there is COVID at her nursing home and is currently residing at [facility].  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  Patient also complains of severe anxiety and difficulty sleeping.  Has a history of hypertension.     Home Medications Prior to Admission medications   Medication Sig Start Date End Date Taking? Authorizing Provider  hydrOXYzine (ATARAX) 25 MG tablet Take 1 tablet (25 mg total) by mouth at bedtime as needed for anxiety. 11/09/22  Yes Sheri Reichert, MD  amLODipine (NORVASC) 10 MG tablet Take 0.5 tablets (5 mg total) by mouth daily. 11/09/22   Sheri Reichert, MD  apixaban (ELIQUIS) 5 MG TABS tablet Take 1 tablet (5 mg total) by mouth 2 (two) times daily. 10/22/22   Sheri Beck D, NP  docusate sodium (COLACE) 100 MG capsule Take 100 mg by mouth 2 (two) times daily.    [provider]  escitalopram (LEXAPRO) 5 MG tablet Take 1 tablet (5 mg total) by mouth daily. 10/23/22   Sheri Beck D, NP   lisinopril-hydrochlorothiazide (ZESTORETIC) 20-12.5 MG tablet Take 1 tablet by mouth once daily 10/05/22   Farrel Conners, MD  ondansetron (ZOFRAN) 4 MG tablet Take 1 tablet (4 mg total) by mouth every 4 (four) hours as needed for nausea or vomiting. 10/22/22   Sheri Beck D, NP  polyethylene glycol (MIRALAX / GLYCOLAX) 17 g packet Take 17 g by mouth daily as needed for moderate constipation. 10/22/22   Sheri Beck D, NP  promethazine (PHENERGAN) 12.5 MG tablet Take 1-2 tablets (12.5-25 mg total) by mouth every 4 (four) hours as needed for refractory nausea / vomiting. 10/22/22   Sheri Beck D, NP  senna (SENOKOT) 8.6 MG tablet Take 1-2 tablets by mouth at bedtime as needed for constipation.    [provider]      Allergies    Patient has no known allergies.    Review of Systems   Review of Systems  Cardiovascular:  Positive for chest pain.  All other systems reviewed and are negative.   Physical Exam Updated Vital Signs BP 125/85 (BP Location: Right Arm)   Pulse 78   Temp 97.8 F (36.6 C) (Oral)   Resp 17   SpO2 99%  Physical Exam Vitals and nursing note reviewed.  Constitutional:      Appearance: She is well-developed.  HENT:     Head:  Normocephalic.     Comments: Fullness to the right head without tenderness at prior surgical site.   Cardiovascular:     Rate and Rhythm: Normal rate and regular rhythm.     Heart sounds: No murmur heard. Pulmonary:     Effort: Pulmonary effort is normal. No respiratory distress.     Breath sounds: Normal breath sounds.  Abdominal:     Palpations: Abdomen is soft.     Tenderness: There is no abdominal tenderness. There is no guarding or rebound.  Musculoskeletal:        General: No tenderness.  Skin:    General: Skin is warm and dry.  Neurological:     Mental Status: She is alert and oriented to person, place, and time.     Comments: Left hemi paresis with left sided neglect.    Psychiatric:        Behavior:  Behavior normal.     ED Results / Procedures / Treatments   Labs (all labs ordered are listed, but only abnormal results are displayed) Labs Reviewed  COMPREHENSIVE METABOLIC PANEL - Abnormal; Notable for the following components:      Result Value   Potassium 3.2 (*)    Glucose, Bld 137 (*)    BUN 32 (*)    Albumin 3.3 (*)    AST 50 (*)    ALT 93 (*)    All other components within normal limits  LACTIC ACID, PLASMA - Abnormal; Notable for the following components:   Lactic Acid, Venous 2.2 (*)    All other components within normal limits  PROTIME-INR - Abnormal; Notable for the following components:   Prothrombin Time 15.3 (*)    All other components within normal limits  URINALYSIS, ROUTINE W REFLEX MICROSCOPIC - Abnormal; Notable for the following components:   Specific Gravity, Urine 1.033 (*)    Hgb urine dipstick MODERATE (*)    Leukocytes,Ua TRACE (*)    Bacteria, UA RARE (*)    All other components within normal limits  LACTIC ACID, PLASMA  CBC WITH DIFFERENTIAL/PLATELET  TROPONIN I (HIGH SENSITIVITY)  TROPONIN I (HIGH SENSITIVITY)    EKG EKG Interpretation  Date/Time:  Tuesday November 09 2022 00:08:34 EST Ventricular Rate:  76 PR Interval:  155 QRS Duration: 91 QT Interval:  382 QTC Calculation: 430 R Axis:   8 Text Interpretation: Sinus rhythm Low voltage, precordial leads Left ventricular hypertrophy Borderline T abnormalities, anterior leads Confirmed by Sheri Beck [phone]) on 11/09/2022 12:12:33 AM  Radiology CT Head Wo Contrast  Result Date: 11/09/2022 CLINICAL DATA:  Initial evaluation for neuro deficit, stroke suspected. EXAM: CT HEAD WITHOUT CONTRAST TECHNIQUE: Contiguous axial images were obtained from the base of the skull through the vertex without intravenous contrast. RADIATION DOSE REDUCTION: This exam was performed according to the departmental dose-optimization program which includes automated exposure control, adjustment of the mA and/or  kV according to patient size and/or use of iterative reconstruction technique. COMPARISON:  Prior CT from 11/04/2022 as well as earlier studies. FINDINGS: Brain: Postoperative changes from prior right hemi craniectomy. There has been continued interval evolution of the large right MCA territory infarct. Scattered hemorrhage within the evolving area of infarction, decreased in prominence from previous exam. No new hemorrhage identified. Persistent bulging of brain parenchyma through the craniectomy defect, most pronounced posteriorly. Changes have decreased from prior. Associated ex vacuo dilatation of the right lateral ventricle again noted. No new intracranial hemorrhage or large vessel territory infarct. Small remote left cerebellar infarct noted,  stable. No mass lesion or significant midline shift. No extra-axial fluid collection. Vascular: No abnormal hyperdense vessel. Skull: Prior right hemi craniectomy.  No complicating features. Sinuses/Orbits: Right gaze preference noted. Globes orbital soft tissues demonstrate no other acute finding. Mild scattered mucosal thickening noted about the ethmoidal air cells. Paranasal sinuses are otherwise clear. No mastoid effusion. Other: None. IMPRESSION: 1. Continued interval evolution of large right MCA territory infarct. Associated hemorrhage has decreased in prominence from previous exam. No new intracranial hemorrhage. 2. Postoperative changes from prior right hemi craniectomy. Persistent but decreased protrusion of brain parenchyma through the craniectomy defect. 3. No other new acute intracranial abnormality. Electronically Signed   By: Jeannine Boga M.Beck.   On: 11/09/2022 02:05   DG Chest Port 1 View  Result Date: 11/09/2022 CLINICAL DATA:  Chest pain EXAM: PORTABLE CHEST 1 VIEW COMPARISON:  10/31/2022 FINDINGS: Heart and mediastinal contours are within normal limits. No focal opacities or effusions. No acute bony abnormality. IMPRESSION: No active disease.  Electronically Signed   By: Rolm Baptise M.Beck.   On: 11/09/2022 01:08    Procedures Procedures    Medications Ordered in ED Medications  sodium chloride 0.9 % bolus 500 mL (0 mLs Intravenous Stopped 11/09/22 5329)    ED Course/ Medical Decision Making/ A&P                             Medical Decision Making Amount and/or Complexity of Data Reviewed Labs: ordered. Radiology: ordered.  Risk Prescription drug management.   Patient with recent history of CVA as well as PE on anticoagulation and right craniectomy here for evaluation of slurred speech and seeing stars as well as chest tightness.  She is at her neurologic baseline in the emergency department with initial borderline blood pressure.  She does states she has been eating and drinking well.  BUN is mildly increased when compared to recent.  Initial lactate was minimally elevated and repeat improved prior to any IV fluid administration.  No reports of bleeding, hemoglobin is within normal limits.  Transaminases are mildly elevated but this is similar when compared to priors, patient without active abdominal pain.  UA is not consistent with UTI.  CT head with stable changes, no evidence of recurrent infarct.  Patient not actively suicidal but does have some anxiety.  She was treated with IV fluids with improvement in her blood pressure.  Suspect that her blood pressure medications may need to be titrated down slightly.  Will change her amlodipine from 10 mg to 5 mg daily.  Will also provide as needed hydroxyzine for anxiety.  Current clinical picture is not consistent with recurrent CVA, PE, ACS.  Feel she is stable at this point to discharge back to her facility with close outpatient follow-up and return precautions.  Attempted to contact her daughter over the phone-no answer when called.       Final Clinical Impression(s) / ED Diagnoses Final diagnoses:  Syncope, near    Rx / DC Orders ED Discharge Orders          Ordered     hydrOXYzine (ATARAX) 25 MG tablet  At bedtime PRN        11/09/22 0640    amLODipine (NORVASC) 10 MG tablet  Daily        11/09/22 0641              Sheri Reichert, MD 11/09/22 [phone]

## 2022-11-09 NOTE — ED Notes (Signed)
[facility] at beside to transport pt to [facility]. All belongings and paperwork given to [facility]

## 2022-11-22 NOTE — Progress Notes (Unsigned)
[facility] [address] [place] [address] [phone]       HOSPITAL FOLLOW UP NOTE  [name] Date of Birth:  02-14-1966 Medical Record Number:  [id number]   Reason for Referral:  hospital stroke follow up    SUBJECTIVE:   CHIEF COMPLAINT:  No chief complaint on file.   HPI:   Sheri Beck is a 57 y.o. who  has a past medical history of Hypertension.  Patient presented on 10/16/2022 with sudden onset of right gaze deviation, left hemiplegia, sensory loss and hemineglect. NIHSS 20. CT showed large right MCA infarct. She was taken for emergent right temporal parietal hemicraniectomy with Dr Arnoldo Morale. Craniectomy flap placed in to abdominal tissue on right. She developed multiple DVTs and heparin transitioned to Eliquis. She was discharged to [facility] 10/22/2022. She returned to ER for evaluation 10/26/2022 following a fall. She reported rolling out of her bed. She returned again, 10/31/2022, for chest pain and headache. CT was reassuring. Another visit 11/03/2022 for fall and 11/04/2022 for suicidal ideations. Personally reviewed hospitalization pertinent progress notes, lab work and imaging.  Evaluated by Dr Leonie Man.    Left sided weakness, tingling. Can not see out of left eye.   PERTINENT IMAGING/LABS  Code Stroke CT head - Large Right MCA infarct. ASPECTS 2. No hemorrhagic transformation, and only minor intracranial mass effect at this time with no midline shift. CTA head & neck right M1 occlusion at its origin. CT Repeat 1/14 shows large right MCA infarct with cytotoxic edema and s/p right hemicraniectomy but no midline shift to the left.  Trace petechial hemorrhage at the periphery of the infarct. MRI  not done 2D Echo ejection fraction 60 to 65%.   A1C Lab Results  Component Value Date   HGBA1C 6.0 (H) 10/18/2022    Lipid Panel     Component Value Date/Time   CHOL 159 10/18/2022 0500   CHOL 207 (H) 03/31/2018 1054   TRIG 116 10/18/2022  0500   HDL 43 10/18/2022 0500   HDL 52 03/31/2018 1054   CHOLHDL 3.7 10/18/2022 0500   VLDL 23 10/18/2022 0500   LDLCALC 93 10/18/2022 0500   LDLCALC 123 (H) 03/31/2018 1054   LABVLDL 32 03/31/2018 1054      ROS:   14 system review of systems performed and negative with exception of those listed in HPI  PMH:  Past Medical History:  Diagnosis Date   Hypertension     PSH:  Past Surgical History:  Procedure Laterality Date   CRANIOTOMY Right 10/16/2022   Procedure: RIGHT CRANIECTOMY;  Surgeon: Newman Pies, MD;  Location: [facility];  Service: Neurosurgery;  Laterality: Right;    Social History:  Social History   Socioeconomic History   Marital status: Single    Spouse name: Not on file   Number of children: Not on file   Years of education: Not on file   Highest education level: Not on file  Occupational History   Not on file  Tobacco Use   Smoking status: Every Day    Packs/day: 0.10    Types: Cigarettes   Smokeless tobacco: Never  Vaping Use   Vaping Use: Never used  Substance and Sexual Activity   Alcohol use: Yes    Comment: occasional   Drug use: Yes    Types: Cocaine    Comment: Pt states last use was 10/15/22   Sexual activity: Yes    Birth control/protection: None  Other Topics Concern  Not on file  Social History Narrative   Not on file   Social Determinants of Health   Financial Resource Strain: Not on file  Food Insecurity: No Food Insecurity (10/20/2022)   Hunger Vital Sign    Worried About Running Out of Food in the Last Year: Never true    Ran Out of Food in the Last Year: Never true  Transportation Needs: No Transportation Needs (10/20/2022)   PRAPARE - Hydrologist (Medical): No    Lack of Transportation (Non-Medical): No  Physical Activity: Not on file  Stress: Not on file  Social Connections: Not on file  Intimate Partner Violence: Not At Risk (10/20/2022)   Humiliation, Afraid, Rape, and Kick  questionnaire    Fear of Current or Ex-Partner: No    Emotionally Abused: No    Physically Abused: No    Sexually Abused: No    Family History:  Family History  Problem Relation Age of Onset   Cancer Mother    Heart disease Father    Glaucoma Father    Varicose Veins Paternal Grandfather    Breast cancer Neg Hx     Medications:   Current Outpatient Medications on File Prior to Visit  Medication Sig Dispense Refill   amLODipine (NORVASC) 10 MG tablet Take 0.5 tablets (5 mg total) by mouth daily. 90 tablet 0   apixaban (ELIQUIS) 5 MG TABS tablet Take 1 tablet (5 mg total) by mouth 2 (two) times daily. 60 tablet    docusate sodium (COLACE) 100 MG capsule Take 100 mg by mouth 2 (two) times daily.     escitalopram (LEXAPRO) 5 MG tablet Take 1 tablet (5 mg total) by mouth daily.     hydrOXYzine (ATARAX) 25 MG tablet Take 1 tablet (25 mg total) by mouth at bedtime as needed for anxiety. 7 tablet 0   lisinopril-hydrochlorothiazide (ZESTORETIC) 20-12.5 MG tablet Take 1 tablet by mouth once daily 90 tablet 0   ondansetron (ZOFRAN) 4 MG tablet Take 1 tablet (4 mg total) by mouth every 4 (four) hours as needed for nausea or vomiting. 20 tablet 0   polyethylene glycol (MIRALAX / GLYCOLAX) 17 g packet Take 17 g by mouth daily as needed for moderate constipation. 14 each 0   promethazine (PHENERGAN) 12.5 MG tablet Take 1-2 tablets (12.5-25 mg total) by mouth every 4 (four) hours as needed for refractory nausea / vomiting. 30 tablet 0   senna (SENOKOT) 8.6 MG tablet Take 1-2 tablets by mouth at bedtime as needed for constipation.     No current facility-administered medications on file prior to visit.    Allergies:  No Known Allergies    OBJECTIVE:  Physical Exam  There were no vitals filed for this visit. There is no height or weight on file to calculate BMI. No results found.     04/08/2022    3:58 PM  Depression screen PHQ 2/9  Decreased Interest 0  Down, Depressed, Hopeless 0   PHQ - 2 Score 0     General: well developed, well nourished, seated, in no evident distress Head: head normocephalic and atraumatic.   Neck: supple with no carotid or supraclavicular bruits Cardiovascular: regular rate and rhythm, no murmurs Musculoskeletal: no deformity Skin:  no rash/petichiae Vascular:  Normal pulses all extremities   Neurologic Exam Mental Status: Awake and fully alert.  Fluent speech and language.  Oriented to place and time. Recent and remote memory intact. Attention span, concentration and fund of  knowledge appropriate. Mood and affect appropriate.  Cranial Nerves: Fundoscopic exam reveals sharp disc margins. Pupils equal, briskly reactive to light. Extraocular movements full without nystagmus. Visual fields full to confrontation. Hearing intact. Facial sensation intact. Face, tongue, palate moves normally and symmetrically.  Motor: Normal bulk and tone. Normal strength in all tested extremity muscles Sensory.: intact to touch , pinprick , position and vibratory sensation.  Coordination: Rapid alternating movements normal in all extremities. Finger-to-nose and heel-to-shin performed accurately bilaterally. Gait and Station: Arises from chair without difficulty. Stance is normal. Gait demonstrates normal stride length and balance with ***. Tandem walk and heel toe ***.  Reflexes: 1+ and symmetric.    NIHSS  *** Modified Rankin  ***    ASSESSMENT: Sheri Beck is a 57 y.o. year old female ***. Vascular risk factors include ***.      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  Patient appears to be a rapid progresser of malignant cerebral edema with significant established edema within few hours of onset Etiology:  large vessel occlusion right   middle cerebral artery etiology cryptogenic: Residual deficit: ***. Continue {anticoagulants:31417}  and ***  for  secondary stroke prevention.  Discussed secondary stroke prevention measures and importance of close PCP follow up for aggressive stroke risk factor management. I have gone over the pathophysiology of stroke, warning signs and symptoms, risk factors and their management in some detail with instructions to go to the closest emergency room for symptoms of concern.  HTN: BP goal <130/90.  Stable on *** per PCP HLD: LDL goal <70. Recent LDL ***.  DMII: A1c goal<7.0. Recent A1c ***.    Follow up in *** or call earlier if needed   CC:  [facility] provider: Dr. Leonie Man PCP: Farrel Conners, MD    I spent *** minutes of face-to-face and non-face-to-face time with patient.  This included previsit chart review including review of recent hospitalization, lab review, study review, order entry, electronic health record documentation, patient education regarding recent stroke including etiology, secondary stroke prevention measures and importance of managing stroke risk factors, residual deficits and typical recovery time and answered all other questions to patient satisfaction   Debbora Presto, [facility]  [facility] [address] [place], [place] [address]  Phone [phone] Fax [phone] Note: This document was prepared with digital dictation and possible smart phrase technology. Any transcriptional errors that result from this process are unintentional.

## 2022-11-23 ENCOUNTER — Ambulatory Visit (INDEPENDENT_AMBULATORY_CARE_PROVIDER_SITE_OTHER): Payer: BLUE CROSS/BLUE SHIELD | Admitting: Family Medicine

## 2022-11-23 ENCOUNTER — Encounter: Payer: Self-pay | Admitting: Family Medicine

## 2022-11-23 ENCOUNTER — Telehealth: Payer: Self-pay | Admitting: Family Medicine

## 2022-11-23 VITALS — BP 128/81 | HR 97

## 2022-11-23 DIAGNOSIS — I63511 Cerebral infarction due to unspecified occlusion or stenosis of right middle cerebral artery: Secondary | ICD-10-CM

## 2022-11-23 NOTE — Telephone Encounter (Signed)
Pt daughter is callling. Stated she wanted to know what happen at pt appointment today. She is requesting you leave a VM message because she is at work.

## 2022-11-23 NOTE — Telephone Encounter (Signed)
Called Daughter and left her a message stating what happened at her appointment today. I told the daughter that her mother was in a lot of pain and was unable to give [place] background about what was going on with her and that her provider Amy called the facility she was in to get a better picture of what is going and with the information she was given she sent a referral to a specialist.

## 2022-11-24 ENCOUNTER — Encounter: Payer: Self-pay | Admitting: Family Medicine

## 2022-12-02 ENCOUNTER — Telehealth: Payer: Self-pay | Admitting: Family Medicine

## 2022-12-02 NOTE — Telephone Encounter (Signed)
error 

## 2022-12-02 NOTE — Telephone Encounter (Signed)
Please contact [facility] and [facility] in response to questions sent to me regarding her care.   Please have the facility reach out to [facility] to inquire about need for follow up post craniotomy and time frame expected to replace flap. I am unaware of their plans and unable to see notes in Epic.   Patient reported to have continued falls. I recommend that they follow up closely with her PCP and PT/OT providers. In reference to protection helmet, PCP should be able to address.   She does not currently have follow up with [facility]. We are happy to see her back and I recommend she see Leonie Man. If she is unable to provide history she will need to come with another adult who is familiar with her care. She presented to [place] and unable to participate in any meaningful conversation with no one to assist in obtaining history.

## 2022-12-02 NOTE — Telephone Encounter (Signed)
Left message for nurse Amy to call me.

## 2022-12-07 NOTE — Telephone Encounter (Signed)
Called [facility] at [phone]. Spoke w/ Freda Munro. She will have nurse, Amy call our office back. Provided office number: [phone]

## 2022-12-13 ENCOUNTER — Encounter: Payer: Self-pay | Admitting: *Deleted

## 2022-12-13 NOTE — Telephone Encounter (Signed)
Nurse Amy called back from [facility] and I informed her with all the below from Debbora Presto, NP.Marland Kitchen

## 2022-12-13 NOTE — Telephone Encounter (Signed)
Amy, we have tried calling twice and no return call. Ok to send letter with your recommendations to them via fax?

## 2022-12-13 NOTE — Telephone Encounter (Signed)
Faxed signed letter by Jennye Boroughs to [facility] at [phone]. Received fax confirmation.
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
## 2023-01-04 ENCOUNTER — Ambulatory Visit (INDEPENDENT_AMBULATORY_CARE_PROVIDER_SITE_OTHER): Payer: BLUE CROSS/BLUE SHIELD | Admitting: Family Medicine

## 2023-01-04 ENCOUNTER — Encounter: Payer: Self-pay | Admitting: Family Medicine

## 2023-01-04 VITALS — BP 102/70 | HR 75 | Temp 98.6°F | Ht 62.0 in

## 2023-01-04 DIAGNOSIS — E876 Hypokalemia: Secondary | ICD-10-CM | POA: Diagnosis not present

## 2023-01-04 DIAGNOSIS — F320 Major depressive disorder, single episode, mild: Secondary | ICD-10-CM

## 2023-01-04 DIAGNOSIS — G8929 Other chronic pain: Secondary | ICD-10-CM | POA: Insufficient documentation

## 2023-01-04 LAB — BASIC METABOLIC PANEL
BUN: 18 mg/dL (ref 6–23)
CO2: 28 mEq/L (ref 19–32)
Calcium: 10.5 mg/dL (ref 8.4–10.5)
Chloride: 99 mEq/L (ref 96–112)
Creatinine, Ser: 0.66 mg/dL (ref 0.40–1.20)
GFR: 96.2 mL/min (ref >60.00)
Glucose, Bld: 103 mg/dL — ABNORMAL HIGH (ref 70–99)
Potassium: 3.2 mEq/L — ABNORMAL LOW (ref 3.5–5.1)
Sodium: 136 mEq/L (ref 135–145)

## 2023-01-04 MED ORDER — ESCITALOPRAM OXALATE 10 MG PO TABS: 10.0000 mg | ORAL_TABLET | Freq: Every day | ORAL | 3 refills | Status: DC

## 2023-01-04 MED ORDER — DICLOFENAC SODIUM 1 % EX GEL: 2.0000 g | Freq: Four times a day (QID) | CUTANEOUS | 5 refills | Status: DC | PRN

## 2023-01-04 MED ORDER — GABAPENTIN 100 MG PO CAPS: 100.0000 mg | ORAL_CAPSULE | Freq: Three times a day (TID) | ORAL | 3 refills | Status: DC

## 2023-01-04 NOTE — Assessment & Plan Note (Signed)
New seen on labs in the ER in February, will check repeat BMP today in follow up.

## 2023-01-04 NOTE — Assessment & Plan Note (Signed)
Scoring 11 on the PHQ, will increase her lexapro to 10 mg daily, RTC in 3 months.

## 2023-01-04 NOTE — Progress Notes (Signed)
Established Patient Office Visit  Subjective   Patient ID: Sheri Beck, female    DOB: 1966/03/17  Age: 57 y.o. MRN: [id number]  Chief Complaint  Patient presents with   Medical Management of Chronic Issues    Pt is here for follow up today.   Patient is asking for something for pain. 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 She was seen in the ER several times over the past 3 months for various issues including SI, syncope and chest pain/ stroke like symptoms. She states that her pain is every day, states she has chronic pain on the left side of her body, chronic eye pain.  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 States that they told her it was "phantom pain" she cannot move the left side of her body at all. States that it is mostly in her hip and back on the left.  Patient continues on Eliquis 5 mg twice daily for permanent anticoagulation. She is currently living in the nursing home, states that they gave her 90 days and when her insurance runs out she will be living with her dad temporarily. States that eventually she would like to move into a handicap apartment with home health aids and a hospital bed.   I reviewed her last set of labs from the ER on 11/09/22. She also had a repeat CT head at that time which showed involution of the previous stroke findings, no acute findings since the stroke she had previously. She appeared dehydrated and hypokalemic at that time, I will order a new BMP today in follow up.    Current Outpatient Medications  Medication Instructions   amLODipine (NORVASC) 5 mg, Oral, Daily   apixaban (ELIQUIS) 5 mg, Oral, 2 times daily   diclofenac Sodium (VOLTAREN) 2 g, Topical, 4 times daily PRN   docusate sodium (COLACE) 100 mg, Oral, 2 times daily   escitalopram (LEXAPRO) 10 mg, Oral, Daily   gabapentin (NEURONTIN) 100  mg, Oral, 3 times daily, Start with 1 capsule QHS for 7 days, then 1 capsule BID for 7 days, then 1 capsule TID.   hydrOXYzine (ATARAX) 25 mg, Oral, At bedtime PRN   lisinopril-hydrochlorothiazide (ZESTORETIC) 20-12.5 MG tablet 1 tablet, Oral, Daily   ondansetron (ZOFRAN) 4 mg, Oral, Every 4 hours PRN   polyethylene glycol (MIRALAX / GLYCOLAX) 17 g, Oral, Daily PRN   promethazine (PHENERGAN) 12.5-25 mg, Oral, Every 4 hours PRN   senna (SENOKOT) 8.6 MG tablet 1-2 tablets, Oral, At bedtime PRN    Patient Active Problem List   Diagnosis Date Noted   Hypokalemia 01/04/2023   Other chronic pain 01/04/2023   Suicidal ideation 11/04/2022   Substance or medication-induced depressive disorder with onset during withdrawal 10/21/2022   Major depressive disorder, single episode, mild 10/21/2022   Adjustment disorder with depressed mood 10/21/2022   Delirium due to multiple etiologies, acute, hypoactive 10/21/2022   Tobacco use 10/21/2022   Cocaine dependence 10/21/2022   Episodic cannabis use 10/21/2022   Snoring 10/21/2022   Alcohol use 10/21/2022   Stroke 10/16/2022   Stroke (cerebrum) 10/16/2022   Hyperlipidemia 09/28/2022   Severe obesity (BMI 35.0-39.9) with comorbidity 09/28/2022   Acute bacterial conjunctivitis of right eye 05/06/2020   Dental abscess 05/06/2020   Essential hypertension 05/06/2020      Review of  Systems  All other systems reviewed and are negative.     Objective:     BP 102/70 (BP Location: Right Arm, Patient Position: Sitting, Cuff Size: Large)   Pulse 75   Temp 98.6 F (37 C) (Oral)   Ht 5\' 2"  (1.575 m)   SpO2 98%   BMI 37.30 kg/m  BP Readings from Last 3 Encounters:  01/04/23 102/70  11/23/22 128/81  11/09/22 125/85      Physical Exam Vitals reviewed.  Constitutional:      Appearance: Normal appearance. She is well-groomed. She is obese.  Eyes:     Conjunctiva/sclera: Conjunctivae normal.  Neck:     Thyroid: No thyromegaly.   Cardiovascular:     Rate and Rhythm: Normal rate and regular rhythm.     Pulses: Normal pulses.     Heart sounds: S1 normal and S2 normal.  Pulmonary:     Effort: Pulmonary effort is normal.     Breath sounds: Normal breath sounds and air entry.  Abdominal:     General: Bowel sounds are normal.  Neurological:     Mental Status: She is alert and oriented to person, place, and time. Mental status is at baseline.     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        Speech: Speech normal.        Behavior: Behavior normal.        Judgment: Judgment normal.      No results found for any visits on 01/04/23.  Last metabolic panel Lab Results  Component Value Date   GLUCOSE 137 (H) 11/09/2022   NA 139 11/09/2022   K 3.2 (L) 11/09/2022   CL 103 11/09/2022   CO2 26 11/09/2022   BUN 32 (H) 11/09/2022   CREATININE 0.87 11/09/2022   GFRNONAA >60 11/09/2022   CALCIUM 9.7 11/09/2022   PHOS 2.6 10/19/2022   PROT 6.5 11/09/2022   ALBUMIN 3.3 (L) 11/09/2022   LABGLOB 2.8 03/31/2018   AGRATIO 1.5 03/31/2018   BILITOT 0.3 11/09/2022   ALKPHOS 80 11/09/2022   AST 50 (H) 11/09/2022   ALT 93 (H) 11/09/2022   ANIONGAP 10 11/09/2022      The ASCVD Risk score (Arnett DK, et al., 2019) failed to calculate for the following reasons:   The patient has a prior MI or stroke diagnosis    Assessment & Plan:   Problem List Items Addressed This Visit       Unprioritized   Major depressive disorder, single episode, mild    Scoring 11 on the PHQ, will increase her lexapro to 10 mg daily, RTC in 3 months.       Relevant Medications   escitalopram (LEXAPRO) 10 MG tablet   Hypokalemia    New seen on labs in the ER in February, will check repeat BMP today in follow up.      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 Also reporting eye pain. She does have a history of substance abuse in the past, however she is taking chronic AC and using oral NSAIDS can increase her risk of bleeding. We discussed different medications and we will start gabapentin with  100 mg daily at bedtime, then increase as tolerated to TID dosing. I will see her back in 3 month to reassess her symptoms.       Relevant Medications   gabapentin (NEURONTIN) 100 MG capsule   diclofenac Sodium (VOLTAREN) 1 % GEL   escitalopram (LEXAPRO) 10 MG tablet    Return in about 3 months (around 04/05/2023) for HTN.    Farrel Conners, MD

## 2023-01-04 NOTE — Assessment & Plan Note (Signed)
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 Also reporting eye pain. She does have a history of substance abuse in the past, however she is taking chronic AC and using oral NSAIDS can increase her risk of bleeding. We discussed different medications and we will start gabapentin with 100 mg daily at bedtime, then increase as tolerated to TID dosing. I will see her back in 3 month to reassess her symptoms.

## 2023-01-07 MED ORDER — POTASSIUM CHLORIDE CRYS ER 20 MEQ PO TBCR: 20.0000 meq | EXTENDED_RELEASE_TABLET | Freq: Every day | ORAL | 0 refills | Status: DC

## 2023-01-07 NOTE — Addendum Note (Signed)
Addended by: Karie Georges on: 01/07/2023 10:22 AM   Modules accepted: Orders

## 2023-01-25 ENCOUNTER — Telehealth: Payer: Self-pay

## 2023-01-25 NOTE — Transitions of Care (Post Inpatient/ED Visit) (Signed)
   01/25/2023  Name: Sheri Beck MRN: [id number] DOB: 1966-01-13  Today's TOC FU Call Status: Today's TOC FU Call Status:: Unsuccessul Call (1st Attempt) Unsuccessful Call (1st Attempt) Date: 01/25/23  Attempted to reach the patient regarding the most recent Inpatient/ED visit.  Follow Up Plan: Additional outreach attempts will be made to reach the patient to complete the Transitions of Care (Post Inpatient/ED visit) call.   Signature Kandis Fantasia, LPN [facility] Advisor [facility] You Are. We Are. [employer] # [phone]

## 2023-01-28 ENCOUNTER — Telehealth: Payer: Self-pay | Admitting: Family Medicine

## 2023-01-28 ENCOUNTER — Ambulatory Visit: Payer: BLUE CROSS/BLUE SHIELD | Admitting: Family Medicine

## 2023-01-28 DIAGNOSIS — Z0279 Encounter for issue of other medical certificate: Secondary | ICD-10-CM

## 2023-01-28 NOTE — Telephone Encounter (Signed)
Patient dropped off document Insurance Form, to be filled out by provider. Patient requested to send it via Call Patient to pick up and Fax form to [employer] within 5-days. Document is located in providers tray at front office.Please advise at Mobile [phone] (mobile)

## 2023-01-31 ENCOUNTER — Encounter: Payer: Self-pay | Admitting: Family Medicine

## 2023-01-31 ENCOUNTER — Ambulatory Visit (INDEPENDENT_AMBULATORY_CARE_PROVIDER_SITE_OTHER): Payer: BLUE CROSS/BLUE SHIELD | Admitting: Family Medicine

## 2023-01-31 ENCOUNTER — Telehealth: Payer: Self-pay | Admitting: Family Medicine

## 2023-01-31 VITALS — BP 98/62 | HR 74 | Temp 97.9°F

## 2023-01-31 DIAGNOSIS — I69354 Hemiplegia and hemiparesis following cerebral infarction affecting left non-dominant side: Secondary | ICD-10-CM | POA: Diagnosis not present

## 2023-01-31 DIAGNOSIS — G8929 Other chronic pain: Secondary | ICD-10-CM

## 2023-01-31 DIAGNOSIS — F4321 Adjustment disorder with depressed mood: Secondary | ICD-10-CM

## 2023-01-31 DIAGNOSIS — R519 Headache, unspecified: Secondary | ICD-10-CM

## 2023-01-31 DIAGNOSIS — I63511 Cerebral infarction due to unspecified occlusion or stenosis of right middle cerebral artery: Secondary | ICD-10-CM

## 2023-01-31 DIAGNOSIS — H9202 Otalgia, left ear: Secondary | ICD-10-CM

## 2023-01-31 MED ORDER — ESCITALOPRAM OXALATE 20 MG PO TABS: 20.0000 mg | ORAL_TABLET | Freq: Every day | ORAL | 0 refills | Status: DC

## 2023-01-31 MED ORDER — GABAPENTIN 300 MG PO CAPS: 300.0000 mg | ORAL_CAPSULE | Freq: Three times a day (TID) | ORAL | 0 refills | Status: DC

## 2023-01-31 MED ORDER — NEOMYCIN-POLYMYXIN-HC 3.5-10000-1 OT SOLN: 4.0000 [drp] | Freq: Four times a day (QID) | OTIC | 0 refills | Status: DC

## 2023-01-31 NOTE — Telephone Encounter (Signed)
Pt mom [name] is calling and the hospital bed need to go [facility] does not accept pt insurance

## 2023-01-31 NOTE — Progress Notes (Signed)
   Subjective:    Patient ID: Sheri Beck, female    DOB: 12/28/1965, 57 y.o.   MRN: [id number]  HPI Here with her husband and her mother to follow up on some issues. 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 She had a right decompressive craniectomy per Dr. Lovell Sheehan on 10-16-22. She also complains of chronic headaches as well as chronic "phantom" pains in the left ear and the entire left half of her body. She has been in the ED several times since then, and a CT scan on 11-09-22 showed the infarct to be stable with no further bleeding. She was discharged to a skilled nursing facility, and now she has been home since last week. She has a hx of depression and anxiety, and she has been taking Lexapro 10 mg daily. Her other says she needs something stronger to "calm her down". She saw Dr. Casimiro Needle, her PCP, on 01-04-23, and she started her on Gabapentin 100 mg TID. Janica says this has helped with the pain, but she wants something stronger. They also ask for a RX for a hospital bed and a bedside table. She is also using Diclofenac gel several times a day. She also complains of left ear pain. She says it feels better when she puts some gauze or cotton in the ear.    Review of Systems  Constitutional: Negative.   HENT:  Positive for ear pain.   Respiratory: Negative.    Cardiovascular: Negative.   Gastrointestinal: Negative.   Genitourinary: Negative.   Musculoskeletal:  Positive for arthralgias, back pain and myalgias.  Neurological:  Positive for weakness and headaches.  Psychiatric/Behavioral:  Positive for decreased concentration and dysphoric mood. Negative for agitation, behavioral problems, confusion, hallucinations, self-injury, sleep disturbance and suicidal ideas. The patient is nervous/anxious.        Objective:   Physical Exam Constitutional:      Comments: In a wheelchair   HENT:     Right Ear: Tympanic membrane, ear canal  and external ear normal.     Left Ear: Tympanic membrane, ear canal and external ear normal.  Cardiovascular:     Rate and Rhythm: Normal rate and regular rhythm.     Pulses: Normal pulses.     Heart sounds: Normal heart sounds.  Pulmonary:     Effort: Pulmonary effort is normal.     Breath sounds: Normal breath sounds.  Neurological:     Mental Status: She is alert and oriented to person, place, and time.           Assessment & Plan:  She has had a large stroke and she requires a lot of assistance from her family. She is scheduled to have the craniectomy soace filled in by Dr. Lovell Sheehan on 02-08-23. For her depression and anxiety, we will increase the Lexapro to 20 mg daily. For the body pains and headaches, we will increase the Gabapentin to 300 mg TID. For the left ear pain, she can use Cortisporin Otic drops as needed. We also wrote RX for a hospital bed and a bedside table. She will follow up with Dr. Casimiro Needle. We spent a total of ( 35  ) minutes reviewing records and discussing these issues.  Gershon Crane, MD

## 2023-02-02 ENCOUNTER — Telehealth: Payer: Self-pay | Admitting: Family Medicine

## 2023-02-02 DIAGNOSIS — G8929 Other chronic pain: Secondary | ICD-10-CM

## 2023-02-02 MED ORDER — DICLOFENAC SODIUM 1 % EX GEL: 2.0000 g | Freq: Four times a day (QID) | CUTANEOUS | 5 refills | Status: DC | PRN

## 2023-02-02 MED ORDER — APIXABAN 5 MG PO TABS: 5.0000 mg | ORAL_TABLET | Freq: Two times a day (BID) | ORAL | 3 refills | Status: DC

## 2023-02-02 NOTE — Telephone Encounter (Signed)
Order placed for the hospital bed, I have filled out the paperowrk and will give it to Jo AN.

## 2023-02-02 NOTE — Telephone Encounter (Signed)
Pt mom [name]  is calling and pt need a new rx apixaban (ELIQUIS) 5 MG TABS tablet  and diclofenac Sodium (VOLTAREN) 1 % GEL to go to new [id number] - [place], [place] - [address] AT [facility] OF [address] Phone: [phone]  Fax: [phone]

## 2023-02-02 NOTE — Telephone Encounter (Signed)
Ok to refill the apixaban-- she has a long history of DVT's

## 2023-02-02 NOTE — Telephone Encounter (Signed)
Attempted to reach the patient's mother [name] to let her know the paperwork was completed, left at the front desk for pick up, there is a $29 fee per PCP [phone]) and a message was received stating the voicemail has not been set up yet.  Community message sent via Epic to the DME team with [facility] to see if they can provide a hospital bed as requested below.

## 2023-02-02 NOTE — Telephone Encounter (Signed)
Rx for Diclofenac gel sent to [facility].  Message forwarded to PCP as the Rx for Apixaban was previously prescribed by a different provider.

## 2023-02-02 NOTE — Telephone Encounter (Signed)
Noted  

## 2023-02-02 NOTE — Telephone Encounter (Signed)
Pt calling to check on progress of this request for the bed. Also asking about paperwork that was dropped off for completion and to be sent out

## 2023-02-02 NOTE — Telephone Encounter (Signed)
Rx done. 

## 2023-02-02 NOTE — Telephone Encounter (Signed)
Copy sent to be scanned. 

## 2023-02-03 NOTE — Telephone Encounter (Signed)
Patient's mother called back and was informed of the message below. She asked if the form was faxed and was advised there is no fax number listed on the form and stated she will get this information and call back.

## 2023-02-04 ENCOUNTER — Encounter: Payer: Self-pay | Admitting: *Deleted

## 2023-02-04 NOTE — Telephone Encounter (Signed)
Paperwork faxed to [facility] at the number below.

## 2023-02-04 NOTE — Telephone Encounter (Signed)
Called back with fax number   [phone]

## 2023-02-07 ENCOUNTER — Telehealth: Payer: Self-pay | Admitting: Family Medicine

## 2023-02-07 DIAGNOSIS — I63511 Cerebral infarction due to unspecified occlusion or stenosis of right middle cerebral artery: Secondary | ICD-10-CM

## 2023-02-07 NOTE — Addendum Note (Signed)
Addended by: Johnella Moloney on: 02/07/2023 04:30 PM   Modules accepted: Orders

## 2023-02-07 NOTE — Telephone Encounter (Signed)
Ok to place order for those things under her CVA diagnosis

## 2023-02-07 NOTE — Telephone Encounter (Signed)
Home health aid requested patient get an order for shower chair, walker and hoyer lift.

## 2023-02-07 NOTE — Telephone Encounter (Signed)
I called the patient's mother for more information/contact information also.  Sheri Beck [phone]) stated a coordinator from [facility] came to the patient's home for an assessment and advised the items below be sent to [facility].  Orders entered and community care message sent via Epic.  Message forwarded to PCP as she also wanted to ask for an alternative for Eliquis be sent to [facility] on [address] as this is not completely covered by Medicaid.

## 2023-02-08 NOTE — Telephone Encounter (Signed)
I called [name], the patient's mother and informed her a community message was received from [facility] stating they are out of network with the patient's insurance.  [name] stated she will contact [facility] to give the new insurance info and will also bring in a copy to our office.

## 2023-02-12 ENCOUNTER — Other Ambulatory Visit: Payer: Self-pay | Admitting: Family Medicine

## 2023-02-12 DIAGNOSIS — E876 Hypokalemia: Secondary | ICD-10-CM

## 2023-02-21 ENCOUNTER — Encounter (HOSPITAL_COMMUNITY): Payer: Self-pay | Admitting: Emergency Medicine

## 2023-02-21 ENCOUNTER — Emergency Department (HOSPITAL_COMMUNITY): Payer: BLUE CROSS/BLUE SHIELD

## 2023-02-21 ENCOUNTER — Inpatient Hospital Stay (HOSPITAL_COMMUNITY)
Admission: EM | Admit: 2023-02-21 | Discharge: 2023-02-24 | DRG: 864 | Disposition: A | Discharge status: Home Health | Payer: BLUE CROSS/BLUE SHIELD | Attending: Internal Medicine | Admitting: Internal Medicine

## 2023-02-21 ENCOUNTER — Other Ambulatory Visit: Payer: Self-pay

## 2023-02-21 DIAGNOSIS — Z9889 Other specified postprocedural states: Secondary | ICD-10-CM

## 2023-02-21 DIAGNOSIS — Z1152 Encounter for screening for COVID-19: Secondary | ICD-10-CM

## 2023-02-21 DIAGNOSIS — R6510 Systemic inflammatory response syndrome (SIRS) of non-infectious origin without acute organ dysfunction: Secondary | ICD-10-CM | POA: Diagnosis not present

## 2023-02-21 DIAGNOSIS — R519 Headache, unspecified: Secondary | ICD-10-CM | POA: Diagnosis present

## 2023-02-21 DIAGNOSIS — Z83511 Family history of glaucoma: Secondary | ICD-10-CM

## 2023-02-21 DIAGNOSIS — I69354 Hemiplegia and hemiparesis following cerebral infarction affecting left non-dominant side: Secondary | ICD-10-CM

## 2023-02-21 DIAGNOSIS — F4321 Adjustment disorder with depressed mood: Secondary | ICD-10-CM | POA: Diagnosis present

## 2023-02-21 DIAGNOSIS — E785 Hyperlipidemia, unspecified: Secondary | ICD-10-CM | POA: Diagnosis present

## 2023-02-21 DIAGNOSIS — G319 Degenerative disease of nervous system, unspecified: Secondary | ICD-10-CM | POA: Diagnosis present

## 2023-02-21 DIAGNOSIS — R509 Fever, unspecified: Secondary | ICD-10-CM | POA: Diagnosis present

## 2023-02-21 DIAGNOSIS — Z8249 Family history of ischemic heart disease and other diseases of the circulatory system: Secondary | ICD-10-CM | POA: Diagnosis not present

## 2023-02-21 DIAGNOSIS — E669 Obesity, unspecified: Secondary | ICD-10-CM | POA: Diagnosis present

## 2023-02-21 DIAGNOSIS — I82409 Acute embolism and thrombosis of unspecified deep veins of unspecified lower extremity: Secondary | ICD-10-CM | POA: Diagnosis not present

## 2023-02-21 DIAGNOSIS — Z6833 Body mass index (BMI) 33.0-33.9, adult: Secondary | ICD-10-CM | POA: Diagnosis not present

## 2023-02-21 DIAGNOSIS — I10 Essential (primary) hypertension: Secondary | ICD-10-CM | POA: Diagnosis present

## 2023-02-21 DIAGNOSIS — Z86718 Personal history of other venous thrombosis and embolism: Secondary | ICD-10-CM

## 2023-02-21 DIAGNOSIS — D72829 Elevated white blood cell count, unspecified: Secondary | ICD-10-CM | POA: Diagnosis present

## 2023-02-21 DIAGNOSIS — M79642 Pain in left hand: Secondary | ICD-10-CM | POA: Diagnosis present

## 2023-02-21 DIAGNOSIS — F1411 Cocaine abuse, in remission: Secondary | ICD-10-CM | POA: Diagnosis present

## 2023-02-21 DIAGNOSIS — Z7901 Long term (current) use of anticoagulants: Secondary | ICD-10-CM | POA: Diagnosis not present

## 2023-02-21 DIAGNOSIS — F32A Depression, unspecified: Secondary | ICD-10-CM | POA: Diagnosis present

## 2023-02-21 DIAGNOSIS — F419 Anxiety disorder, unspecified: Secondary | ICD-10-CM | POA: Diagnosis present

## 2023-02-21 DIAGNOSIS — Z79899 Other long term (current) drug therapy: Secondary | ICD-10-CM | POA: Diagnosis not present

## 2023-02-21 DIAGNOSIS — I6930 Unspecified sequelae of cerebral infarction: Secondary | ICD-10-CM

## 2023-02-21 DIAGNOSIS — Z87891 Personal history of nicotine dependence: Secondary | ICD-10-CM | POA: Diagnosis not present

## 2023-02-21 DIAGNOSIS — R32 Unspecified urinary incontinence: Secondary | ICD-10-CM | POA: Diagnosis present

## 2023-02-21 DIAGNOSIS — N179 Acute kidney failure, unspecified: Secondary | ICD-10-CM | POA: Diagnosis present

## 2023-02-21 LAB — COMPREHENSIVE METABOLIC PANEL
ALT: 37 U/L (ref 0–44)
AST: 32 U/L (ref 15–41)
Albumin: 3.6 g/dL (ref 3.5–5.0)
Alkaline Phosphatase: 63 U/L (ref 38–126)
Anion gap: 11 (ref 5–15)
BUN: 27 mg/dL — ABNORMAL HIGH (ref 6–20)
CO2: 21 mmol/L — ABNORMAL LOW (ref 22–32)
Calcium: 9.5 mg/dL (ref 8.9–10.3)
Chloride: 104 mmol/L (ref 98–111)
Creatinine, Ser: 1.09 mg/dL — ABNORMAL HIGH (ref 0.44–1.00)
GFR, Estimated: 59 mL/min — ABNORMAL LOW (ref >60)
Glucose, Bld: 144 mg/dL — ABNORMAL HIGH (ref 70–99)
Potassium: 4.2 mmol/L (ref 3.5–5.1)
Sodium: 136 mmol/L (ref 135–145)
Total Bilirubin: 1.2 mg/dL (ref 0.3–1.2)
Total Protein: 7.5 g/dL (ref 6.5–8.1)

## 2023-02-21 LAB — URINALYSIS, W/ REFLEX TO CULTURE (INFECTION SUSPECTED)
Bilirubin Urine: NEGATIVE
Glucose, UA: NEGATIVE mg/dL
Hgb urine dipstick: NEGATIVE
Ketones, ur: NEGATIVE mg/dL
Nitrite: NEGATIVE
Protein, ur: 30 mg/dL — AB
Specific Gravity, Urine: 1.028 (ref 1.005–1.030)
pH: 5 (ref 5.0–8.0)

## 2023-02-21 LAB — RESPIRATORY PANEL BY PCR

## 2023-02-21 LAB — PROTIME-INR
INR: 1.1 (ref 0.8–1.2)
Prothrombin Time: 14.2 seconds (ref 11.4–15.2)

## 2023-02-21 LAB — CBC WITH DIFFERENTIAL/PLATELET
Abs Immature Granulocytes: 0.02 10*3/uL (ref 0.00–0.07)
Basophils Absolute: 0.1 10*3/uL (ref 0.0–0.1)
Basophils Relative: 1 %
Eosinophils Absolute: 0.1 10*3/uL (ref 0.0–0.5)
Eosinophils Relative: 1 %
HCT: 43.7 % (ref 36.0–46.0)
Hemoglobin: 14.5 g/dL (ref 12.0–15.0)
Immature Granulocytes: 0 %
Lymphocytes Relative: 31 %
Lymphs Abs: 3.1 10*3/uL (ref 0.7–4.0)
MCH: 28.5 pg (ref 26.0–34.0)
MCHC: 33.2 g/dL (ref 30.0–36.0)
MCV: 86 fL (ref 80.0–100.0)
Monocytes Absolute: 0.7 10*3/uL (ref 0.1–1.0)
Monocytes Relative: 7 %
Neutro Abs: 6.1 10*3/uL (ref 1.7–7.7)
Neutrophils Relative %: 60 %
Platelets: 187 10*3/uL (ref 150–400)
RBC: 5.08 MIL/uL (ref 3.87–5.11)
RDW: 14.3 % (ref 11.5–15.5)
WBC: 10.2 10*3/uL (ref 4.0–10.5)
nRBC: 0 % (ref 0.0–0.2)

## 2023-02-21 LAB — SARS CORONAVIRUS 2 BY RT PCR: SARS Coronavirus 2 by RT PCR: NEGATIVE

## 2023-02-21 LAB — LACTIC ACID, PLASMA
Lactic Acid, Venous: 2.3 mmol/L (ref 0.5–1.9)
Lactic Acid, Venous: 1.7 mmol/L (ref 0.5–1.9)

## 2023-02-21 LAB — APTT: aPTT: 27 seconds (ref 24–36)

## 2023-02-21 MED ORDER — LACTATED RINGERS IV SOLN: 150.0000 mL/h | INTRAVENOUS | Status: AC

## 2023-02-21 MED ORDER — PROCHLORPERAZINE EDISYLATE 10 MG/2ML IJ SOLN: 5.0000 mg | INTRAMUSCULAR | Status: DC | PRN

## 2023-02-21 MED ORDER — APIXABAN 5 MG PO TABS
5.0000 mg | ORAL_TABLET | Freq: Two times a day (BID) | ORAL | Status: DC
  Administered 2023-02-21 – 2023-02-24 (×6): 5 mg via ORAL
  Filled 2023-02-21 (×6): qty 1

## 2023-02-21 MED ORDER — HYDROXYZINE HCL 25 MG PO TABS
25.0000 mg | ORAL_TABLET | Freq: Every evening | ORAL | Status: DC | PRN
  Administered 2023-02-21 – 2023-02-23 (×3): 25 mg via ORAL
  Filled 2023-02-21 (×3): qty 1

## 2023-02-21 MED ORDER — ACETAMINOPHEN 160 MG/5ML PO SOLN
650.0000 mg | Freq: Once | ORAL | Status: AC
  Administered 2023-02-21: 650 mg via ORAL
  Filled 2023-02-21: qty 20.3

## 2023-02-21 MED ORDER — DOCUSATE SODIUM 50 MG PO CAPS
50.0000 mg | ORAL_CAPSULE | Freq: Once | ORAL | Status: AC
  Administered 2023-02-21: 50 mg via ORAL
  Filled 2023-02-21: qty 1

## 2023-02-21 MED ORDER — ONDANSETRON HCL 4 MG/2ML IJ SOLN
4.0000 mg | Freq: Once | INTRAMUSCULAR | Status: AC
  Administered 2023-02-21: 4 mg via INTRAVENOUS
  Filled 2023-02-21: qty 2

## 2023-02-21 MED ORDER — GABAPENTIN 300 MG PO CAPS
300.0000 mg | ORAL_CAPSULE | Freq: Three times a day (TID) | ORAL | Status: DC
  Administered 2023-02-21 – 2023-02-24 (×9): 300 mg via ORAL
  Filled 2023-02-21 (×9): qty 1

## 2023-02-21 MED ORDER — LACTATED RINGERS IV SOLN: INTRAVENOUS | Status: DC

## 2023-02-21 MED ORDER — DICLOFENAC SODIUM 1 % EX GEL
2.0000 g | Freq: Four times a day (QID) | CUTANEOUS | Status: DC | PRN
  Administered 2023-02-24: 2 g via TOPICAL
  Filled 2023-02-21: qty 100

## 2023-02-21 MED ORDER — KETOROLAC TROMETHAMINE 15 MG/ML IJ SOLN
15.0000 mg | Freq: Once | INTRAMUSCULAR | Status: AC
  Administered 2023-02-21: 15 mg via INTRAVENOUS
  Filled 2023-02-21: qty 1

## 2023-02-21 MED ORDER — HYDROMORPHONE HCL 1 MG/ML IJ SOLN
1.0000 mg | Freq: Once | INTRAMUSCULAR | Status: AC
  Administered 2023-02-21: 1 mg via INTRAVENOUS
  Filled 2023-02-21: qty 1

## 2023-02-21 MED ORDER — ACETAMINOPHEN 650 MG RE SUPP: 650.0000 mg | Freq: Four times a day (QID) | RECTAL | Status: DC | PRN

## 2023-02-21 MED ORDER — METOCLOPRAMIDE HCL 5 MG/ML IJ SOLN
10.0000 mg | Freq: Once | INTRAMUSCULAR | Status: AC
  Administered 2023-02-21: 10 mg via INTRAVENOUS
  Filled 2023-02-21: qty 2

## 2023-02-21 MED ORDER — ORAL CARE MOUTH RINSE: 15.0000 mL | OROMUCOSAL | Status: DC | PRN

## 2023-02-21 MED ORDER — ACETAMINOPHEN 325 MG PO TABS: 650.0000 mg | ORAL_TABLET | Freq: Once | ORAL | Status: DC

## 2023-02-21 MED ORDER — SODIUM CHLORIDE 0.9 % IV BOLUS (SEPSIS)
1000.0000 mL | Freq: Once | INTRAVENOUS | Status: AC
  Administered 2023-02-21: 1000 mL via INTRAVENOUS

## 2023-02-21 MED ORDER — LACTATED RINGERS IV BOLUS
500.0000 mL | Freq: Once | INTRAVENOUS | Status: AC
  Administered 2023-02-21: 500 mL via INTRAVENOUS

## 2023-02-21 MED ORDER — ESCITALOPRAM OXALATE 20 MG PO TABS
20.0000 mg | ORAL_TABLET | Freq: Every day | ORAL | Status: DC
  Administered 2023-02-22 – 2023-02-24 (×3): 20 mg via ORAL
  Filled 2023-02-21 (×3): qty 1

## 2023-02-21 MED ORDER — SODIUM CHLORIDE 0.9 % IV BOLUS
1000.0000 mL | Freq: Once | INTRAVENOUS | Status: AC
  Administered 2023-02-21: 1000 mL via INTRAVENOUS

## 2023-02-21 MED ORDER — SODIUM CHLORIDE 0.9 % IV SOLN
2.0000 g | Freq: Once | INTRAVENOUS | Status: AC
  Administered 2023-02-21: 2 g via INTRAVENOUS
  Filled 2023-02-21: qty 20

## 2023-02-21 MED ORDER — PANTOPRAZOLE SODIUM 40 MG PO TBEC
40.0000 mg | DELAYED_RELEASE_TABLET | Freq: Every day | ORAL | Status: DC
  Administered 2023-02-21 – 2023-02-24 (×4): 40 mg via ORAL
  Filled 2023-02-21 (×4): qty 1

## 2023-02-21 MED ORDER — ACETAMINOPHEN 325 MG PO TABS
650.0000 mg | ORAL_TABLET | Freq: Four times a day (QID) | ORAL | Status: DC | PRN
  Administered 2023-02-21 – 2023-02-22 (×3): 650 mg via ORAL
  Filled 2023-02-21 (×3): qty 2

## 2023-02-21 NOTE — Consult Note (Signed)
[facility]    Date of Admission:  02/21/2023   Total days of inpatient antibiotics 1        Reason for Consult: Fever    Principal Problem:   SIRS (systemic inflammatory response syndrome) (HCC) Active Problems:   Essential hypertension   Hyperlipidemia   Adjustment disorder with depressed mood   History of craniotomy   History of CVA with residual deficit   AKI (acute kidney injury) (HCC)   Headache   Assessment: 57 year old female who recently had right MCA infarct on 10/16/2022 requiring craniectomy for decompression by neurosurgery subsequent was again her most recent left-sided presented with headache found to have Tmax of 101.9: #Fever #Headache #History of craniectomy on 10/16/2022 #DVT on apixaban  Recommendations:  -Follow-up RVP, sputum cultures - Follow-up blood cultures - Microbiology:   Antibiotics: CTX 5/19  Cultures: Blood 5/19 NG    HPI: Sheri Beck is a 57 y.o. female with past medical history of hypertension, hyperlipidemia, depression, class I obesity, dental abscess, alcohol and cocaine abuse in remission large right MCA infarct on 11/02/2022 requiring craniectomy for decompression by neurosurgery with subsequent hemiparesis to blood left side, multiple DVTs during hospitalization currently on apixaban presented to the ED with right-sided headache and fever x 2 days.  Reported burning fluid throughout her body day prior to admission.  On arrival WBC 10K, chest x-ray unrevealing.  CT head showed no acute ventricular abnormality, progressive right hemiparesis left foot menorrhagia since February with mild additional bulging of the meninges from right craniectomy since that time.  During ED she had fever at 101.9 admitted for further management.   Review of Systems: ROS  Past Medical History:  Diagnosis Date   Hypertension     Social History   Tobacco Use   Smoking status: Former    Packs/day: .1    Types:  Cigarettes    Quit date: 10/14/2022    Years since quitting: 0.3   Smokeless tobacco: Never  Vaping Use   Vaping Use: Never used  Substance Use Topics   Alcohol use: Yes    Comment: occasional   Drug use: Yes    Types: Cocaine    Comment: Pt states last use was 10/15/22    Family History  Problem Relation Age of Onset   Cancer Mother    Heart disease Father    Glaucoma Father    Varicose Veins Paternal Grandfather    Breast cancer Neg Hx    Scheduled Meds:  apixaban  5 mg Oral BID   escitalopram  20 mg Oral Daily   gabapentin  300 mg Oral TID   pantoprazole  40 mg Oral Daily   Continuous Infusions:  lactated ringers 150 mL/hr (02/21/23 0720)   PRN Meds:.acetaminophen **OR** acetaminophen, diclofenac Sodium, hydrOXYzine, prochlorperazine No Known Allergies  OBJECTIVE: Blood pressure 104/63, pulse (!) 102, temperature 98.1 F (36.7 C), temperature source Oral, resp. rate 18, height 5\' 2"  (1.575 m), weight 83.5 kg, SpO2 90 %.  Physical Exam  Lab Results Lab Results  Component Value Date   WBC 10.2 02/21/2023   HGB 14.5 02/21/2023   HCT 43.7 02/21/2023   MCV 86.0 02/21/2023   PLT 187 02/21/2023    Lab Results  Component Value Date   CREATININE 1.09 (H) 02/21/2023   BUN 27 (H) 02/21/2023   NA 136 02/21/2023   K 4.2 02/21/2023   CL 104 02/21/2023   CO2 21 (L) 02/21/2023  Lab Results  Component Value Date   ALT 37 02/21/2023   AST 32 02/21/2023   ALKPHOS 63 02/21/2023   BILITOT 1.2 02/21/2023       Danelle Earthly, MD [facility] 02/21/2023, 3:00 PM

## 2023-02-21 NOTE — TOC Initial Note (Addendum)
Transition of Care [facility]) - Initial/Assessment Note    Patient Details  Name: Sheri Beck MRN: [id number] Date of Birth: May 24, 1966  [facility]) CM/SW Contact:    Lanier Clam, RN Phone Number: 02/21/2023, 4:53 PM  Clinical Narrative: Spoke to patient in rm about d/c plans-return home Active w/[facility]/PT/OT;has w/c,3n1. Will need PTAR confirmed address.  already started getting dme hospital bed,hoyer lift will f/u w/[name] Hoskins-Godmother.                Expected Discharge Plan: Home w Home Health Services Barriers to Discharge: Continued Medical Work up   Patient Goals and CMS Choice Patient states their goals for this hospitalization and ongoing recovery are:: Home CMS Medicare.gov Compare Post Acute Care list provided to:: Patient Choice offered to / list presented to : Patient [place] ownership interest in [facility].provided to:: Patient    Expected Discharge Plan and Services   Discharge Planning Services: CM Consult Post Acute Care Choice: Home Health Living arrangements for the past 2 months: Single Family Home                                      Prior Living Arrangements/Services Living arrangements for the past 2 months: Single Family Home Lives with:: Other (Comment) (Fiance) Patient language and need for interpreter reviewed:: Yes Do you feel safe going back to the place where you live?: Yes      Need for Family Participation in Patient Care: Yes (Comment) Care giver support system in place?: Yes (comment) Current home services: DME, Home OT, Home PT, Home RN (w/c,3n1;[facility]/PT/OT) Criminal Activity/Legal Involvement Pertinent to Current Situation/Hospitalization: No - Comment as needed  Activities of Daily Living Home Assistive Devices/Equipment: Wheelchair ADL Screening (condition at time of admission) Patient's cognitive ability adequate to safely complete daily activities?: Yes Is the patient deaf  or have difficulty hearing?: No Does the patient have difficulty seeing, even when wearing glasses/contacts?: No Does the patient have difficulty concentrating, remembering, or making decisions?: No Patient able to express need for assistance with ADLs?: Yes Does the patient have difficulty dressing or bathing?: Yes Independently performs ADLs?: No Communication: Independent Dressing (OT): Needs assistance Is this a change from baseline?: Pre-admission baseline Grooming: Needs assistance Is this a change from baseline?: Pre-admission baseline Feeding: Independent Is this a change from baseline?: Pre-admission baseline Bathing: Needs assistance Is this a change from baseline?: Pre-admission baseline Toileting: Needs assistance Is this a change from baseline?: Pre-admission baseline In/Out Bed: Needs assistance Is this a change from baseline?: Pre-admission baseline Walks in Home: Needs assistance Is this a change from baseline?: Pre-admission baseline Does the patient have difficulty walking or climbing stairs?: Yes Weakness of Legs: Left Weakness of Arms/Hands: Left  Permission Sought/Granted Permission sought to share information with : Case Manager Permission granted to share information with : Yes, Verbal Permission Granted  Share Information with NAME: Case Manager           Emotional Assessment Appearance:: Appears stated age Attitude/Demeanor/Rapport: Gracious Affect (typically observed): Accepting Orientation: : Oriented to Self, Oriented to Place, Oriented to  Time, Oriented to Situation Alcohol / Substance Use: Alcohol Use Psych Involvement: No (comment)  Admission diagnosis:  Bad headache [R51.9] SIRS (systemic inflammatory response syndrome) (HCC) [R65.10] Febrile illness, acute [R50.9] Patient Active Problem List   Diagnosis Date Noted   SIRS (systemic inflammatory response syndrome) (HCC) 02/21/2023  History of craniotomy 02/21/2023   History of CVA with  residual deficit 02/21/2023   AKI (acute kidney injury) (HCC) 02/21/2023   Headache 02/21/2023   Hypokalemia 01/04/2023   Other chronic pain 01/04/2023   Suicidal ideation 11/04/2022   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   Major depressive disorder, single episode, 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   Adjustment disorder with depressed mood 10/21/2022   Delirium due to multiple etiologies, acute, hypoactive 10/21/2022   Tobacco use 10/21/2022   Cocaine dependence (HCC) 10/21/2022   Episodic cannabis use 10/21/2022   Snoring 10/21/2022   Alcohol use 10/21/2022   Stroke (HCC) 10/16/2022   Stroke (cerebrum) (HCC) 10/16/2022   Hyperlipidemia 09/28/2022   Severe obesity (BMI 35.0-39.9) with comorbidity (HCC) 09/28/2022   Acute bacterial conjunctivitis of right eye 05/06/2020   Dental abscess 05/06/2020   Essential hypertension 05/06/2020   PCP:  Karie Georges, MD Pharmacy:   [id number] - [place], [place] - [address] AT [facility] OF [address] & [address] [facility] [address] Phone: [phone] Fax: [phone]     Social Determinants of Health (SDOH) Social History: SDOH Screenings   Food Insecurity: No Food Insecurity (02/21/2023)  Housing: Low Risk  (02/21/2023)  Transportation Needs: No Transportation Needs (02/21/2023)  Utilities: Not At Risk (02/21/2023)  Depression (PHQ2-9): High Risk (01/04/2023)  Tobacco Use: Medium Risk (02/21/2023)   SDOH Interventions:     Readmission Risk Interventions     No data to display

## 2023-02-21 NOTE — ED Provider Notes (Signed)
[place] EMERGENCY DEPARTMENT AT [facility] Provider Note   CSN: [id number] Arrival date & time: 02/21/23  4098     History  Chief Complaint  Patient presents with   Fever    Sheri Beck is a 57 y.o. female.  The history is provided by the patient and medical records.  Fever Sheri Beck is a 57 y.o. female who presents to the Emergency Department complaining of headache.  She presents to the emergency department complaining of right-sided headache over the last 2 days.  She did report of burning throughout her entire body that started yesterday.  No reported fevers.  No nausea, vomiting, diarrhea.  She is incontinent at baseline.  She lives at home and her boyfriend provides most of her care.  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  She is on Eliquis and compliant with her medications.  EMS reports fever to 102.  No reported injuries.  She has chronic left-sided weakness and this is at her baseline.     Home Medications Prior to Admission medications   Medication Sig Start Date End Date Taking? Authorizing Provider  amLODipine (NORVASC) 10 MG tablet Take 0.5 tablets (5 mg total) by mouth daily. 11/09/22   Tilden Fossa, MD  apixaban (ELIQUIS) 5 MG TABS tablet Take 1 tablet (5 mg total) by mouth 2 (two) times daily. 02/02/23   Karie Georges, MD  diclofenac Sodium (VOLTAREN) 1 % GEL Apply 2 g topically 4 (four) times daily as needed (apply to areas of pain and inflammation.). 02/02/23   Karie Georges, MD  docusate sodium (COLACE) 100 MG capsule Take 100 mg by mouth 2 (two) times daily.    [provider]  escitalopram (LEXAPRO) 20 MG tablet Take 1 tablet (20 mg total) by mouth daily. 01/31/23   Nelwyn Salisbury, MD  gabapentin (NEURONTIN) 300 MG capsule Take 1 capsule (300 mg total) by mouth 3 (three) times daily. 01/31/23   Nelwyn Salisbury, MD  hydrOXYzine (ATARAX) 25 MG tablet Take 1 tablet (25 mg total) by mouth at bedtime as  needed for anxiety. 11/09/22   Tilden Fossa, MD  lisinopril-hydrochlorothiazide (ZESTORETIC) 20-12.5 MG tablet Take 1 tablet by mouth once daily 10/05/22   Karie Georges, MD  neomycin-polymyxin-hydrocortisone (CORTISPORIN) OTIC solution Place 4 drops into the left ear 4 (four) times daily. 01/31/23   Nelwyn Salisbury, MD  ondansetron (ZOFRAN) 4 MG tablet Take 1 tablet (4 mg total) by mouth every 4 (four) hours as needed for nausea or vomiting. 10/22/22   Val Eagle D, NP  polyethylene glycol (MIRALAX / GLYCOLAX) 17 g packet Take 17 g by mouth daily as needed for moderate constipation. 10/22/22   Val Eagle D, NP  potassium chloride SA (KLOR-CON M) 20 MEQ tablet Take 1 tablet (20 mEq total) by mouth daily. 01/07/23   Karie Georges, MD  promethazine (PHENERGAN) 12.5 MG tablet Take 1-2 tablets (12.5-25 mg total) by mouth every 4 (four) hours as needed for refractory nausea / vomiting. 10/22/22   Val Eagle D, NP  senna (SENOKOT) 8.6 MG tablet Take 1-2 tablets by mouth at bedtime as needed for constipation.    [provider]      Allergies    Patient has no known allergies.    Review of Systems   Review of Systems  Constitutional:  Positive for fever.  All other systems reviewed and are negative.   Physical Exam Updated Vital  Signs BP 90/67   Pulse 99   Temp 98.1 F (36.7 C)   Resp 17   SpO2 98%  Physical Exam Vitals and nursing note reviewed.  Constitutional:      Appearance: She is well-developed.  HENT:     Head: Atraumatic.     Comments: Right-sided cranial deformity at site of prior craniotomy.  No overlying skin changes. Cardiovascular:     Rate and Rhythm: Regular rhythm. Tachycardia present.     Heart sounds: No murmur heard. Pulmonary:     Effort: Pulmonary effort is normal. No respiratory distress.     Breath sounds: Normal breath sounds.  Abdominal:     Palpations: Abdomen is soft.     Tenderness: There is no abdominal tenderness. There is no  guarding or rebound.  Musculoskeletal:        General: No tenderness.  Skin:    General: Skin is warm and dry.  Neurological:     Mental Status: She is alert and oriented to person, place, and time.     Comments: Left hemiparesis  Psychiatric:        Behavior: Behavior normal.     ED Results / Procedures / Treatments   Labs (all labs ordered are listed, but only abnormal results are displayed) Labs Reviewed  LACTIC ACID, PLASMA - Abnormal; Notable for the following components:      Result Value   Lactic Acid, Venous 2.3 (*)    All other components within normal limits  COMPREHENSIVE METABOLIC PANEL - Abnormal; Notable for the following components:   CO2 21 (*)    Glucose, Bld 144 (*)    BUN 27 (*)    Creatinine, Ser 1.09 (*)    GFR, Estimated 59 (*)    All other components within normal limits  URINALYSIS, W/ REFLEX TO CULTURE (INFECTION SUSPECTED) - Abnormal; Notable for the following components:   APPearance HAZY (*)    Protein, ur 30 (*)    Leukocytes,Ua SMALL (*)    Bacteria, UA RARE (*)    All other components within normal limits  SARS CORONAVIRUS 2 BY RT PCR  CULTURE, BLOOD (ROUTINE X 2)  CULTURE, BLOOD (ROUTINE X 2)  LACTIC ACID, PLASMA  CBC WITH DIFFERENTIAL/PLATELET  PROTIME-INR  APTT    EKG EKG Interpretation  Date/Time:  Monday Feb 21 2023 04:08:55 EDT Ventricular Rate:  126 PR Interval:  153 QRS Duration: 65 QT Interval:  311 QTC Calculation: 451 R Axis:   7 Text Interpretation: Sinus tachycardia Borderline T abnormalities, anterior leads Confirmed by Tilden Fossa [id number]) on 02/21/2023 4:59:58 AM  Radiology DG Chest Port 1 View  Result Date: 02/21/2023 CLINICAL DATA:  57 year old female with fever, headache. 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 EXAM: PORTABLE CHEST 1 VIEW COMPARISON:  Portable chest 11/09/2022 and earlier. FINDINGS: Portable AP semi upright view at 0501 hours. Mildly lower lung volumes. Allowing for portable technique  the lungs are clear. No pneumothorax or pleural effusion. Mediastinal contours are stable and within normal limits. Visualized tracheal air column is within normal limits. Negative visible bowel gas. No acute osseous abnormality identified. IMPRESSION: Negative portable chest. Electronically Signed   By: Odessa Fleming M.D.   On: 02/21/2023 05:58   CT Head Wo Contrast  Result Date: 02/21/2023 CLINICAL DATA:  57 year old female with fever, headache. History of right MCA infarct, craniectomy. EXAM: CT HEAD WITHOUT CONTRAST TECHNIQUE: Contiguous axial images were obtained from the base of the skull through the vertex without intravenous  contrast. RADIATION DOSE REDUCTION: This exam was performed according to the departmental dose-optimization program which includes automated exposure control, adjustment of the mA and/or kV according to patient size and/or use of iterative reconstruction technique. COMPARISON:  Head CT 11/09/2022 and earlier. FINDINGS: Brain: Progressive right hemisphere encephalomalacia since February, complete right MCA territory involvement and some watershed area involvement also. Mild progressive ballooning of the meninges from the right craniectomy since that time. Underlying simple cystic encephalomalacia of the brain parenchyma there now. Associated ex vacuo enlargement of the right lateral ventricle. No significant intracranial mass effect. No other ventriculomegaly. No acute intracranial hemorrhage. Stable gray-white differentiation outside of the right hemisphere including small chronic right cerebellar infarct. No new cortically based infarct identified. Vascular: No acute suspicious intracranial vascular hyperdensity. Skull: Right craniectomy redemonstrated. No new osseous abnormality. Sinuses/Orbits: Visualized paranasal sinuses and mastoids are stable and well aerated. Other: Postoperative changes to the right scalp. Orbits appear stable and negative. IMPRESSION: 1. No acute intracranial  abnormality. 2. Progressive right hemisphere encephalomalacia since February, with mild additional bulging of the meninges from the right craniectomy since that time. No significant intracranial mass effect. Electronically Signed   By: Odessa Fleming M.D.   On: 02/21/2023 05:57    Procedures Procedures    Medications Ordered in ED Medications  lactated ringers infusion (0 mLs Intravenous Stopped 02/21/23 0720)  lactated ringers infusion (150 mL/hr Intravenous Rate/Dose Change 02/21/23 0720)  sodium chloride 0.9 % bolus 1,000 mL (1,000 mLs Intravenous New Bag/Given 02/21/23 0726)  acetaminophen (TYLENOL) tablet 650 mg (has no administration in time range)    Or  acetaminophen (TYLENOL) suppository 650 mg (has no administration in time range)  prochlorperazine (COMPAZINE) injection 5 mg (has no administration in time range)  metoCLOPramide (REGLAN) injection 10 mg (10 mg Intravenous Given 02/21/23 0521)  acetaminophen (TYLENOL) 160 MG/5ML solution 650 mg (650 mg Oral Given 02/21/23 0523)  sodium chloride 0.9 % bolus 1,000 mL (0 mLs Intravenous Stopped 02/21/23 0637)  cefTRIAXone (ROCEPHIN) 2 g in sodium chloride 0.9 % 100 mL IVPB (0 g Intravenous Stopped 02/21/23 0608)    ED Course/ Medical Decision Making/ A&P                             Medical Decision Making Amount and/or Complexity of Data Reviewed Labs: ordered. Radiology: ordered. ECG/medicine tests: ordered.  Risk OTC drugs. Prescription drug management. Decision regarding hospitalization.   Patient with history of prior CVA status postcraniotomy on anticoagulation here for evaluation of headache, subjective fevers for 2 days.  She has a left-sided deficits on examination, which appear to be at baseline as well as a fever to 101.9.  She was treated with acetaminophen for her fever as well as metoclopramide for her headache.  Headache and fever resolved after treatment in the emergency department.  She was treated empirically with  Rocephin for possible UTI pending workup.  UA is not consistent with UTI.  Headache resolved after treatment.  Current source of her fever is undetermined at this time.  CBC without significant leukocytosis.  Unlikely meningitis, unable to perform LP given her anticoagulation.  Plan to admit for ongoing care and evaluation.  Patient is in agreement with admission for ongoing treatment.        Final Clinical Impression(s) / ED Diagnoses Final diagnoses:  Bad headache  Febrile illness, acute    Rx / DC Orders ED Discharge Orders     None  Tilden Fossa, MD 02/21/23 [phone]

## 2023-02-21 NOTE — H&P (Addendum)
History and Physical    Patient: Sheri Beck ZOX:[id number] DOB: 06/21/1966 DOA: 02/21/2023 DOS: the patient was seen and examined on 02/21/2023 PCP: Karie Georges, MD  Patient coming from: Home  Chief Complaint:  Chief Complaint  Patient presents with   Fever   HPI: Sheri Beck is a 57 y.o. 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  She also reported having burning feeling throughout her body yesterday.  She denied chills, rhinorrhea, sore throat, wheezing or hemoptysis.  No chest pain, palpitations, diaphoresis, PND, orthopnea or pitting edema of the lower extremities.  No abdominal pain, nausea, emesis, diarrhea, constipation, melena or hematochezia.  No flank pain, dysuria, frequency or hematuria.  No polyuria, polydipsia, polyphagia or blurred vision.   Lab work: Urinalysis was hazy in appearance, protein was 30 mg deciliter, small leukocyte esterase, 11-20 RBC, 6-10 WBCs and rare bacteria microscopic examination.  CBC was normal with a white count of 10.2 with an 60% neutrophils, hemoglobin 14.5 g/dL platelets 811.  CMP showed a CO2 of 21 mmol/L with a normal anion gap.  The rest of the electrolytes were normal.  Glucose 144, BUN 27 and creatinine 1.09 mg/dL.  LFTs were normal.  Imaging: Portable 1 view chest radiograph was negative.  CT head without contrast with no acute intracranial normality.  There is progressive right hemisphere encephalomalacia since February, with mild additional bulging of the meninges from the right craniectomy since that  time.  No significant intracranial mass effect.  ED course: Initial vital signs were temperature 98.5 F, pulse 121, respirations 24 BP 97/83 mmHg O2 sat 95% on room air.  The patient had a fever spike in the emergency department to 101.9 F.  She received 2000 mL of normal saline bolus, acetaminophen 650 mg IVP, metoclopramide 10 mg IVP and 2 g of ceftriaxone IVPB.   Review of Systems: As mentioned in the history of present illness. All other systems reviewed and are negative.  Past Medical History:  Diagnosis Date   Hypertension    Past Surgical History:  Procedure Laterality Date   CRANIOTOMY Right 10/16/2022   Procedure: RIGHT CRANIECTOMY;  Surgeon: Tressie Stalker, MD;  Location: [facility] OR;  Service: Neurosurgery;  Laterality: Right;   Social History:  reports that she quit smoking about 4 months ago. Her smoking use included cigarettes. She smoked an average of .1 packs per day. She has never used smokeless tobacco. She reports current alcohol use. She reports current drug use. Drug: Cocaine.  No Known Allergies  Family History  Problem Relation Age of Onset   Cancer Mother    Heart disease Father    Glaucoma Father    Varicose Veins Paternal Grandfather    Breast cancer Neg Hx     Prior to Admission medications   Medication Sig Start Date End Date Taking? Authorizing Provider  amLODipine (NORVASC) 10 MG tablet Take 0.5 tablets (5 mg total) by mouth daily. 11/09/22   Tilden Fossa, MD  apixaban (ELIQUIS) 5 MG TABS tablet Take 1 tablet (5 mg total) by mouth 2 (two) times daily. 02/02/23   Karie Georges, MD  diclofenac Sodium (VOLTAREN) 1 %  GEL Apply 2 g topically 4 (four) times daily as needed (apply to areas of pain and inflammation.). 02/02/23   Karie Georges, MD  docusate sodium (COLACE) 100 MG capsule Take 100 mg by mouth 2 (two) times daily.    [provider]  escitalopram (LEXAPRO) 20 MG tablet Take 1 tablet (20 mg total) by mouth daily. 01/31/23   Nelwyn Salisbury, MD  gabapentin (NEURONTIN) 300 MG capsule Take 1 capsule (300 mg total) by mouth 3 (three) times daily. 01/31/23   Nelwyn Salisbury, MD  hydrOXYzine (ATARAX) 25 MG tablet Take 1 tablet (25 mg total) by mouth at bedtime as needed for anxiety. 11/09/22   Tilden Fossa, MD  lisinopril-hydrochlorothiazide (ZESTORETIC) 20-12.5 MG tablet Take 1 tablet by mouth once daily 10/05/22   Karie Georges, MD  neomycin-polymyxin-hydrocortisone (CORTISPORIN) OTIC solution Place 4 drops into the left ear 4 (four) times daily. 01/31/23   Nelwyn Salisbury, MD  ondansetron (ZOFRAN) 4 MG tablet Take 1 tablet (4 mg total) by mouth every 4 (four) hours as needed for nausea or vomiting. 10/22/22   Val Eagle D, NP  polyethylene glycol (MIRALAX / GLYCOLAX) 17 g packet Take 17 g by mouth daily as needed for moderate constipation. 10/22/22   Val Eagle D, NP  potassium chloride SA (KLOR-CON M) 20 MEQ tablet Take 1 tablet (20 mEq total) by mouth daily. 01/07/23   Karie Georges, MD  promethazine (PHENERGAN) 12.5 MG tablet Take 1-2 tablets (12.5-25 mg total) by mouth every 4 (four) hours as needed for refractory nausea / vomiting. 10/22/22   Val Eagle D, NP  senna (SENOKOT) 8.6 MG tablet Take 1-2 tablets by mouth at bedtime as needed for constipation.    [provider]    Physical Exam: Vitals:   02/21/23 0402 02/21/23 0434 02/21/23 0445 02/21/23 0630  BP: 97/83  92/77 90/67  Pulse: (!) 121  (!) 119 97  Resp: (!) 24  16 14   Temp: 98.5 F (36.9 C) (!) 101.9 F (38.8 C)    TempSrc: Oral Rectal    SpO2: 95%  96% 94%   Physical Exam Constitutional:      General: She is awake. She is not in acute distress. HENT:     Head: Normocephalic.     Nose: No rhinorrhea.     Mouth/Throat:     Mouth: Mucous membranes are moist.  Eyes:     General: No scleral icterus.    Pupils: Pupils are equal, round, and reactive to light.  Neck:     Vascular: No JVD.  Cardiovascular:     Rate and Rhythm: Normal  rate and regular rhythm.     Heart sounds: S1 normal and S2 normal.  Pulmonary:     Effort: Pulmonary effort is normal.     Breath sounds: Normal breath sounds. No wheezing, rhonchi or rales.  Abdominal:     General: Bowel sounds are normal.     Palpations: Abdomen is soft.     Tenderness: There is no abdominal tenderness.  Musculoskeletal:     Cervical back: Neck supple.     Right lower leg: No edema.     Left lower leg: No edema.  Skin:    General: Skin is warm and dry.  Neurological:     Mental Status: She is alert and oriented to person, place, and time.     Motor: Weakness present.     Comments: Left sided hemiparesis.  Psychiatric:  Mood and Affect: Mood normal.        Behavior: Behavior normal. Behavior is cooperative.     Data Reviewed:  Results are pending, will review when available.  Assessment and Plan: Principal Problem:   SIRS (systemic inflammatory response syndrome) (HCC) Admit to telemetry/inpatient. Continue IV fluids. No obvious source of infection. Hold antibiotics for now. Follow-up blood culture and sensitivity Follow CBC and CMP in a.m. Discussed with ID on call. ID on call will see her and make recommendations.  Active Problems:   AKI (acute kidney injury) (HCC) Continue IV fluids. Hold ARB/ACE. Hold diuretic. Avoid hypotension. Avoid nephrotoxins. Monitor intake and output. Monitor renal function electrolytes.    Essential hypertension BP measurements are soft. Hold antihypertensives for now.    Headache No meningeal signs, N/V, photophobia. Analgesics as needed.    History of craniotomy   History of CVA with residual deficit Supportive care. Headache treatment as above.    Hyperlipidemia Currently not on therapy. Follow-up with primary care provider.    Adjustment disorder with depressed mood Continue escitalopram 20 mg p.o. daily.      Advance Care Planning:   Code Status: Full Code   Consults:   Family  Communication:   Severity of Illness: The appropriate patient status for this patient is OBSERVATION. Observation status is judged to be reasonable and necessary in order to provide the required intensity of service to ensure the patient's safety. The patient's presenting symptoms, physical exam findings, and initial radiographic and laboratory data in the context of their medical condition is felt to place them at decreased risk for further clinical deterioration. Furthermore, it is anticipated that the patient will be medically stable for discharge from the hospital within 2 midnights of admission.   Author: Bobette Mo, MD 02/21/2023 7:08 AM  For on call review [email or web address].   This document was prepared using Dragon voice recognition software and may contain some unintended transcription errors.

## 2023-02-21 NOTE — ED Notes (Signed)
Date and time results received: 02/21/23 4:54 AM  (use smartphrase ".now" to insert current time)  Test: Lactic acid  Critical Value: 2.3  Name of Provider Notified: Madilyn Hook   Orders Received? Or Actions Taken?: Orders Received - See Orders for details

## 2023-02-21 NOTE — Progress Notes (Signed)
Expectorated sputum cup left at bedside with instructions given to pt.

## 2023-02-21 NOTE — ED Triage Notes (Signed)
Patient arrived with complaints of a headache and fever over the last two days. Give 150cc prior to arrival.

## 2023-02-21 NOTE — ED Notes (Signed)
ED TO INPATIENT HANDOFF REPORT  Name/Age/Gender Sheri Beck 57 y.o. female  Code Status    Code Status Orders  (From admission, onward)           Start     Ordered   02/21/23 0711  Full code  Continuous       Question:  By:  Answer:  Consent: discussion documented in EHR   02/21/23 0711           Code Status History     Date Active Date Inactive Code Status Order ID Comments User Context   10/16/2022 1357 10/23/2022 2115 Full Code [id number]  Tressie Stalker, MD Inpatient   10/16/2022 0544 10/16/2022 1357 Full Code [id number]  [facility]       Home/SNF/Other Home  Chief Complaint SIRS (systemic inflammatory response syndrome) (HCC) [R65.10]  Level of Care/Admitting Diagnosis ED Disposition     ED Disposition  Admit   Condition  --   Comment  [facility] [[id number]]  Level of Care: Telemetry [5]  Admit to tele based on following criteria: Other see comments  Comments: SIRS  May place patient in observation at [facility] or [facility] if equivalent level of care is available:: No  Covid Evaluation: Asymptomatic - no recent exposure (last 10 days) testing not required  Diagnosis: SIRS (systemic inflammatory response syndrome) [facility]) [[id number]]  Admitting Physician: Bobette Mo [[id number]]  Attending Physician: Bobette Mo [[id number]]          Medical History Past Medical History:  Diagnosis Date   Hypertension     Allergies No Known Allergies  IV Location/Drains/Wounds Patient Lines/Drains/Airways Status     Active Line/Drains/Airways     Name Placement date Placement time Site Days   Peripheral IV 02/21/23 18 G Anterior;Proximal;Right Forearm 02/21/23  0420  Forearm  less than 1   Peripheral IV 02/21/23 20 G 1" Anterior;Left Hand 02/21/23  0548  Hand  less than 1            Labs/Imaging Results for orders placed or performed during the hospital encounter of 02/21/23 (from the past  48 hour(s))  Urinalysis, w/ Reflex to Culture (Infection Suspected) -Urine, Catheterized     Status: Abnormal   Collection Time: 02/21/23  3:49 AM  Result Value Ref Range   Specimen Source URINE, CATHETERIZED    Color, Urine YELLOW YELLOW   APPearance HAZY (A) CLEAR   Specific Gravity, Urine 1.028 1.005 - 1.030   pH 5.0 5.0 - 8.0   Glucose, UA NEGATIVE NEGATIVE mg/dL   Hgb urine dipstick NEGATIVE NEGATIVE   Bilirubin Urine NEGATIVE NEGATIVE   Ketones, ur NEGATIVE NEGATIVE mg/dL   Protein, ur 30 (A) NEGATIVE mg/dL   Nitrite NEGATIVE NEGATIVE   Leukocytes,Ua SMALL (A) NEGATIVE   RBC / HPF 11-20 0 - 5 RBC/hpf   WBC, UA 6-10 0 - 5 WBC/hpf    Comment:        Reflex urine culture not performed if WBC <=10, OR if Squamous epithelial cells >5. If Squamous epithelial cells >5 suggest recollection.    Bacteria, UA RARE (A) NONE SEEN   Squamous Epithelial / HPF 6-10 0 - 5 /HPF   Mucus PRESENT     Comment: Performed at [facility], [address] [place], [place] [address]  SARS Coronavirus 2 by RT PCR (hospital order, performed in [facility] hospital lab) *cepheid single result test* Anterior Nasal Swab  Status: None   Collection Time: 02/21/23  4:14 AM   Specimen: Anterior Nasal Swab  Result Value Ref Range   SARS Coronavirus 2 by RT PCR NEGATIVE NEGATIVE    Comment: (NOTE) SARS-CoV-2 target nucleic acids are NOT DETECTED.  The SARS-CoV-2 RNA is generally detectable in upper and lower respiratory specimens during the acute phase of infection. The lowest concentration of SARS-CoV-2 viral copies this assay can detect is 250 copies / mL. A negative result does not preclude SARS-CoV-2 infection and should not be used as the sole basis for treatment or other patient management decisions.  A negative result may occur with improper specimen collection / handling, submission of specimen other than nasopharyngeal swab, presence of viral mutation(s) within the areas  targeted by this assay, and inadequate number of viral copies (<250 copies / mL). A negative result must be combined with clinical observations, patient history, and epidemiological information.  Fact Sheet for Patients:   RoadLapTop.co.za  Fact Sheet for Healthcare Providers: http://kim-miller.com/  This test is not yet approved or  cleared by the [place] FDA and has been authorized for detection and/or diagnosis of SARS-CoV-2 by FDA under an Emergency Use Authorization (EUA).  This EUA will remain in effect (meaning this test can be used) for the duration of the COVID-19 declaration under Section 564(b)(1) of the Act, 21 U.S.C. section 360bbb-3(b)(1), unless the authorization is terminated or revoked sooner.  Performed at [facility], [address], [place], [place] [address]   Lactic acid, plasma     Status: Abnormal   Collection Time: 02/21/23  4:16 AM  Result Value Ref Range   Lactic Acid, Venous 2.3 (HH) 0.5 - 1.9 mmol/L    Comment: CRITICAL RESULT CALLED TO, READ BACK BY AND VERIFIED WITH [place], H RN @ [phone] 02/21/23. GILBERTL Performed at [facility], [address] [place], [place] [address]   Comprehensive metabolic panel     Status: Abnormal   Collection Time: 02/21/23  4:16 AM  Result Value Ref Range   Sodium 136 135 - 145 mmol/L   Potassium 4.2 3.5 - 5.1 mmol/L    Comment: HEMOLYSIS AT THIS LEVEL MAY AFFECT RESULT   Chloride 104 98 - 111 mmol/L   CO2 21 (L) 22 - 32 mmol/L   Glucose, Bld 144 (H) 70 - 99 mg/dL    Comment: Glucose reference range applies only to samples taken after fasting for at least 8 hours.   BUN 27 (H) 6 - 20 mg/dL   Creatinine, Ser 1.91 (H) 0.44 - 1.00 mg/dL   Calcium 9.5 8.9 - 47.8 mg/dL   Total Protein 7.5 6.5 - 8.1 g/dL   Albumin 3.6 3.5 - 5.0 g/dL   AST 32 15 - 41 U/L    Comment: HEMOLYSIS AT THIS LEVEL MAY AFFECT RESULT   ALT 37 0 - 44 U/L     Comment: HEMOLYSIS AT THIS LEVEL MAY AFFECT RESULT   Alkaline Phosphatase 63 38 - 126 U/L   Total Bilirubin 1.2 0.3 - 1.2 mg/dL    Comment: HEMOLYSIS AT THIS LEVEL MAY AFFECT RESULT   GFR, Estimated 59 (L) >60 mL/min    Comment: (NOTE) Calculated using the CKD-EPI Creatinine Equation (2021)    Anion gap 11 5 - 15    Comment: Performed at [facility], [address] [place], [place] [address]  CBC with Differential     Status: None   Collection Time: 02/21/23  4:16 AM  Result Value Ref Range   WBC 10.2 4.0 - 10.5 K/uL   RBC 5.08 3.87 - 5.11 MIL/uL   Hemoglobin 14.5 12.0 - 15.0 g/dL   HCT 16.1 09.6 - 04.5 %   MCV 86.0 80.0 - 100.0 fL   MCH 28.5 26.0 - 34.0 pg   MCHC 33.2 30.0 - 36.0 g/dL   RDW 40.9 81.1 - 91.4 %   Platelets 187 150 - 400 K/uL   nRBC 0.0 0.0 - 0.2 %   Neutrophils Relative % 60 %   Neutro Abs 6.1 1.7 - 7.7 K/uL   Lymphocytes Relative 31 %   Lymphs Abs 3.1 0.7 - 4.0 K/uL   Monocytes Relative 7 %   Monocytes Absolute 0.7 0.1 - 1.0 K/uL   Eosinophils Relative 1 %   Eosinophils Absolute 0.1 0.0 - 0.5 K/uL   Basophils Relative 1 %   Basophils Absolute 0.1 0.0 - 0.1 K/uL   Immature Granulocytes 0 %   Abs Immature Granulocytes 0.02 0.00 - 0.07 K/uL    Comment: Performed at [facility], [address] [place], [place] [address]  Protime-INR     Status: None   Collection Time: 02/21/23  4:50 AM  Result Value Ref Range   Prothrombin Time 14.2 11.4 - 15.2 seconds   INR 1.1 0.8 - 1.2    Comment: (NOTE) INR goal varies based on device and disease states. Performed at [facility], [address] [place], [place] [address]   APTT     Status: None   Collection Time: 02/21/23  4:50 AM  Result Value Ref Range   aPTT 27 24 - 36 seconds    Comment: Performed at [facility], [address], [place], [place] [address]  Lactic acid, plasma     Status: None   Collection Time: 02/21/23  5:49  AM  Result Value Ref Range   Lactic Acid, Venous 1.7 0.5 - 1.9 mmol/L    Comment: Performed at [facility], [address] [place], [place] [address]   DG Chest Port 1 View  Result Date: 02/21/2023 CLINICAL DATA:  57 year old female with fever, headache. 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 EXAM: PORTABLE CHEST 1 VIEW COMPARISON:  Portable chest 11/09/2022 and earlier. FINDINGS: Portable AP semi upright view at 0501 hours. Mildly lower lung volumes. Allowing for portable technique the lungs are clear. No pneumothorax or pleural effusion. Mediastinal contours are stable and within normal limits. Visualized tracheal air column is within normal limits. Negative visible bowel gas. No acute osseous abnormality identified. IMPRESSION: Negative portable chest. Electronically Signed   By: Odessa Fleming M.D.   On: 02/21/2023 05:58   CT Head Wo Contrast  Result Date: 02/21/2023 CLINICAL DATA:  57 year old female with fever, headache. History of right MCA infarct, craniectomy. EXAM: CT HEAD WITHOUT CONTRAST TECHNIQUE: Contiguous axial images were obtained from the base of the skull through the vertex without intravenous contrast. RADIATION DOSE REDUCTION: This exam was performed according to the departmental dose-optimization program which includes automated exposure control, adjustment of the mA and/or kV according to patient size and/or use of iterative reconstruction technique. COMPARISON:  Head CT 11/09/2022 and earlier. FINDINGS: Brain: Progressive right hemisphere encephalomalacia since February, complete right MCA territory involvement and some watershed area involvement also. Mild progressive ballooning of the meninges from the right craniectomy since that time. Underlying simple cystic encephalomalacia of the brain parenchyma there now. Associated ex vacuo enlargement of the right  lateral ventricle. No significant intracranial mass effect. No other ventriculomegaly. No acute  intracranial hemorrhage. Stable gray-white differentiation outside of the right hemisphere including small chronic right cerebellar infarct. No new cortically based infarct identified. Vascular: No acute suspicious intracranial vascular hyperdensity. Skull: Right craniectomy redemonstrated. No new osseous abnormality. Sinuses/Orbits: Visualized paranasal sinuses and mastoids are stable and well aerated. Other: Postoperative changes to the right scalp. Orbits appear stable and negative. IMPRESSION: 1. No acute intracranial abnormality. 2. Progressive right hemisphere encephalomalacia since February, with mild additional bulging of the meninges from the right craniectomy since that time. No significant intracranial mass effect. Electronically Signed   By: Odessa Fleming M.D.   On: 02/21/2023 05:57    Pending Labs Unresulted Labs (From admission, onward)     Start     Ordered   02/22/23 0500  CBC with Differential  Daily,   R      02/21/23 0710   02/22/23 0500  Basic metabolic panel  Tomorrow morning,   R        02/21/23 0711   02/21/23 0413  Blood Culture (routine x 2)  (Undifferentiated presentation (screening labs and basic nursing orders))  BLOOD CULTURE X 2,   STAT      02/21/23 0414            Vitals/Pain Today's Vitals   02/21/23 0608 02/21/23 0630 02/21/23 0723 02/21/23 0723  BP:  90/67    Pulse:  97 99   Resp:  14 17   Temp:    98.1 F (36.7 C)  TempSrc:      SpO2:  94% 98%   PainSc: Asleep       Isolation Precautions Airborne and Contact precautions  Medications Medications  lactated ringers infusion (0 mLs Intravenous Stopped 02/21/23 0720)  lactated ringers infusion (150 mL/hr Intravenous Rate/Dose Change 02/21/23 0720)  sodium chloride 0.9 % bolus 1,000 mL (1,000 mLs Intravenous New Bag/Given 02/21/23 0726)  acetaminophen (TYLENOL) tablet 650 mg (has no administration in time range)    Or  acetaminophen (TYLENOL) suppository 650 mg (has no administration in time range)   prochlorperazine (COMPAZINE) injection 5 mg (has no administration in time range)  metoCLOPramide (REGLAN) injection 10 mg (10 mg Intravenous Given 02/21/23 0521)  acetaminophen (TYLENOL) 160 MG/5ML solution 650 mg (650 mg Oral Given 02/21/23 0523)  sodium chloride 0.9 % bolus 1,000 mL (0 mLs Intravenous Stopped 02/21/23 0637)  cefTRIAXone (ROCEPHIN) 2 g in sodium chloride 0.9 % 100 mL IVPB (0 g Intravenous Stopped 02/21/23 0608)    Mobility non-ambulatory

## 2023-02-21 NOTE — ED Notes (Signed)
Pt was cleaned up and brief was changed. Pt had a small BM. NT's assisted with changing pt. Pt was pulled up in bed and left laying comfortably with call light in reach

## 2023-02-22 ENCOUNTER — Inpatient Hospital Stay (HOSPITAL_COMMUNITY): Payer: BLUE CROSS/BLUE SHIELD

## 2023-02-22 DIAGNOSIS — R519 Headache, unspecified: Secondary | ICD-10-CM | POA: Diagnosis not present

## 2023-02-22 DIAGNOSIS — R509 Fever, unspecified: Secondary | ICD-10-CM

## 2023-02-22 DIAGNOSIS — R6510 Systemic inflammatory response syndrome (SIRS) of non-infectious origin without acute organ dysfunction: Secondary | ICD-10-CM | POA: Diagnosis not present

## 2023-02-22 LAB — CBC WITH DIFFERENTIAL/PLATELET
Abs Immature Granulocytes: 0.02 10*3/uL (ref 0.00–0.07)
Basophils Absolute: 0 10*3/uL (ref 0.0–0.1)
Basophils Relative: 0 %
Eosinophils Absolute: 0.2 10*3/uL (ref 0.0–0.5)
Eosinophils Relative: 3 %
HCT: 37.4 % (ref 36.0–46.0)
Hemoglobin: 12.4 g/dL (ref 12.0–15.0)
Immature Granulocytes: 0 %
Lymphocytes Relative: 37 %
Lymphs Abs: 2.9 10*3/uL (ref 0.7–4.0)
MCH: 28.7 pg (ref 26.0–34.0)
MCHC: 33.2 g/dL (ref 30.0–36.0)
MCV: 86.6 fL (ref 80.0–100.0)
Monocytes Absolute: 0.8 10*3/uL (ref 0.1–1.0)
Monocytes Relative: 10 %
Neutro Abs: 3.8 10*3/uL (ref 1.7–7.7)
Neutrophils Relative %: 50 %
Platelets: 144 10*3/uL — ABNORMAL LOW (ref 150–400)
RBC: 4.32 MIL/uL (ref 3.87–5.11)
RDW: 14 % (ref 11.5–15.5)
WBC: 7.7 10*3/uL (ref 4.0–10.5)
nRBC: 0 % (ref 0.0–0.2)

## 2023-02-22 LAB — BLOOD CULTURE ID PANEL (REFLEXED) - BCID2

## 2023-02-22 LAB — BASIC METABOLIC PANEL
Anion gap: 8 (ref 5–15)
BUN: 26 mg/dL — ABNORMAL HIGH (ref 6–20)
CO2: 24 mmol/L (ref 22–32)
Calcium: 9 mg/dL (ref 8.9–10.3)
Chloride: 104 mmol/L (ref 98–111)
Creatinine, Ser: 0.65 mg/dL (ref 0.44–1.00)
GFR, Estimated: >60 mL/min (ref >60)
Glucose, Bld: 100 mg/dL — ABNORMAL HIGH (ref 70–99)
Potassium: 3.4 mmol/L — ABNORMAL LOW (ref 3.5–5.1)
Sodium: 136 mmol/L (ref 135–145)

## 2023-02-22 LAB — CULTURE, BLOOD (ROUTINE X 2): Culture: NO GROWTH

## 2023-02-22 MED ORDER — GADOBUTROL 1 MMOL/ML IV SOLN: 8.0000 mL | Freq: Once | INTRAVENOUS | Status: DC | PRN

## 2023-02-22 NOTE — Progress Notes (Signed)
PHARMACY - PHYSICIAN COMMUNICATION CRITICAL VALUE ALERT - BLOOD CULTURE IDENTIFICATION (BCID)  Sheri Beck is an 57 y.o. female who presented to [facility] on 02/21/2023 with a chief complaint of fever/HA  Assessment: 84 YOF with initial concern for SIRS and r/o craniectomy site infection with HA/fever however without meningitis symptoms and recently changes in sleeping position could be contributing to HA. Monitoring off abx and now with 1 of 4 BCx growing GPC with BCID detecting staph species - presumably a contaminant since only in 1 bottle.   Name of physician (or Provider) Contacted: Ashley Royalty  Current antibiotics: None  Changes to prescribed antibiotics recommended:  None - continue to monitor off antibiotics  Results for orders placed or performed during the hospital encounter of 02/21/23  Blood Culture ID Panel (Reflexed) (Collected: 02/21/2023  4:50 AM)  Result Value Ref Range   Enterococcus faecalis NOT DETECTED NOT DETECTED   Enterococcus Faecium NOT DETECTED NOT DETECTED   Listeria monocytogenes NOT DETECTED NOT DETECTED   Staphylococcus species DETECTED (A) NOT DETECTED   Staphylococcus aureus (BCID) NOT DETECTED NOT DETECTED   Staphylococcus epidermidis NOT DETECTED NOT DETECTED   Staphylococcus lugdunensis NOT DETECTED NOT DETECTED   Streptococcus species NOT DETECTED NOT DETECTED   Streptococcus agalactiae NOT DETECTED NOT DETECTED   Streptococcus pneumoniae NOT DETECTED NOT DETECTED   Streptococcus pyogenes NOT DETECTED NOT DETECTED   A.calcoaceticus-baumannii NOT DETECTED NOT DETECTED   Bacteroides fragilis NOT DETECTED NOT DETECTED   Enterobacterales NOT DETECTED NOT DETECTED   Enterobacter cloacae complex NOT DETECTED NOT DETECTED   Escherichia coli NOT DETECTED NOT DETECTED   Klebsiella aerogenes NOT DETECTED NOT DETECTED   Klebsiella oxytoca NOT DETECTED NOT DETECTED   Klebsiella pneumoniae NOT DETECTED NOT DETECTED   Proteus species NOT DETECTED NOT  DETECTED   Salmonella species NOT DETECTED NOT DETECTED   Serratia marcescens NOT DETECTED NOT DETECTED   Haemophilus influenzae NOT DETECTED NOT DETECTED   Neisseria meningitidis NOT DETECTED NOT DETECTED   Pseudomonas aeruginosa NOT DETECTED NOT DETECTED   Stenotrophomonas maltophilia NOT DETECTED NOT DETECTED   Candida albicans NOT DETECTED NOT DETECTED   Candida auris NOT DETECTED NOT DETECTED   Candida glabrata NOT DETECTED NOT DETECTED   Candida krusei NOT DETECTED NOT DETECTED   Candida parapsilosis NOT DETECTED NOT DETECTED   Candida tropicalis NOT DETECTED NOT DETECTED   Cryptococcus neoformans/gattii NOT DETECTED NOT DETECTED    Thank you for allowing pharmacy to be a part of this patient's care.  Georgina Pillion, PharmD, BCPS Infectious Diseases Clinical Pharmacist 02/22/2023 3:40 PM   **Pharmacist phone directory can now be found on [email or web address] ([email or web address]).  Listed under [facility].

## 2023-02-22 NOTE — TOC Progression Note (Addendum)
Transition of Care [facility]) - Progression Note    Patient Details  Name: Sheri Beck MRN: [id number] Date of Birth: 01-25-1966  Transition of Care [facility]) CM/SW Contact  Dangelo Guzzetta, Olegario Messier, RN Phone Number: 02/22/2023, 12:10 PM  Clinical Narrative: Spoke to Deferred person per patient [name] tel#[phone]-current d/c plan return home-has Carelogy for [name] will contact for continued services even though I reccc to contact them for her-[name] prefers to contact them herself for continued [facility] services HHRN/PT/OT;Contacted [employer] rep Earna Coder for ETA on delivery of hospital bed,hoyer lift, & rw(PTA) [facility] will provide an [facility]TA=they will also contact [name] for f/u of dme & delviery to home. -1p-Received cal from [employer] rep [employer] is not in network with [employer] [facility] for dme. Unable to leave vm w/[name] the contact person-await call back.Contacted [employer] rep Jada-will fax#[phone] orders,hospital bed w/narrative,rw -they do not have hoyer lift. Will also fax to [employer] rep Erie Noe fax#[phone].  -2:32p-Confirmed w/[name] & [employer] rep Zachary-[facility] is able to process for dme-hospital bed,hoyer lift,& rw-awaiting ETA for delivery to home.Per [facility] [phone] M.    Expected Discharge Plan: Home w Home Health Services Barriers to Discharge: Continued Medical Work up  Expected Discharge Plan and Services   Discharge Planning Services: CM Consult Post Acute Care Choice: Home Health Living arrangements for the past 2 months: Single Family Home                                       Social Determinants of Health (SDOH) Interventions SDOH Screenings   Food Insecurity: No Food Insecurity (02/21/2023)  Housing: Low Risk  (02/21/2023)  Transportation Needs: No Transportation Needs (02/21/2023)  Utilities: Not At Risk (02/21/2023)  Depression (PHQ2-9): High Risk  (01/04/2023)  Tobacco Use: Medium Risk (02/21/2023)    Readmission Risk Interventions    02/21/2023    4:54 PM  Readmission Risk Prevention Plan  Transportation Screening Complete  Medication Review (RN Care Manager) Complete  PCP or Specialist appointment within 3-5 days of discharge Complete  HRI or Home Care Consult Complete  SW Recovery Care/Counseling Consult Complete  Palliative Care Screening Not Applicable  Skilled Nursing Facility Not Applicable

## 2023-02-22 NOTE — Hospital Course (Signed)
Sheri Beck is a 57 y.o. 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  She denied chills, rhinorrhea, sore throat, wheezing or hemoptysis.  No chest pain, palpitations, diaphoresis, PND, orthopnea or pitting edema of the lower extremities.  No abdominal pain, nausea, emesis, diarrhea, constipation, melena or hematochezia.  No flank pain, dysuria, frequency or hematuria.  No polyuria, polydipsia, polyphagia or blurred vision   On initial CT, there is progressive right hemisphere encephalomalacia since February, with mild additional bulging of the meninges from the right craniectomy since that time.  She was febrile on admission.  She was admitted for further workup and evaluation with neurosurgery.  Repeat MRI brain was obtained which showed no acute abnormalities.  Prior large area of encephalomalacia again noted at prior site involving right MCA territory infarct.  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  MRI was reviewed by neurosurgery with no need for intervention.  She was recommended for outpatient follow-up in approximately 2 months after discharge.  Due to fever, she had been started on antibiotics and blood cultures were obtained.  ID was also consulted and antibiotics were held.  Blood culture grew 1/4 bottles Staph hominis.

## 2023-02-22 NOTE — Progress Notes (Signed)
Progress Note   Patient: Sheri Beck ZOX:[id number] DOB: 1966/01/10 DOA: 02/21/2023     1 DOS: the patient was seen and examined on 02/22/2023   Brief hospital course: 57 y.o. 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  She also reported having burning feeling throughout her body yesterday.  She denied chills, rhinorrhea, sore throat, wheezing or hemoptysis.  No chest pain, palpitations, diaphoresis, PND, orthopnea or pitting edema of the lower extremities.  No abdominal pain, nausea, emesis, diarrhea, constipation, melena or hematochezia.  No flank pain, dysuria, frequency or hematuria.  No polyuria, polydipsia, polyphagia or blurred vision   On initial CT, there is progressive right hemisphere encephalomalacia since February, with mild additional bulging of the meninges from the right craniectomy since that time.  While in ED, pt was noted to have HR 121 with RR 24 and temp of 101.52F.  Assessment and Plan: Principal Problem:   SIRS (systemic inflammatory response syndrome) (HCC) -No obvious source of infection. -ID following -Continuing to hold antibiotics for now per ID -Blood cx thus far pos for 1/2 gm pos in clusters, pending speciation  -Now leukocytosis -Given past craniotomy, had discussed with Neurosurgery (Dr. Lovell Sheehan) who had recommended MRI brain w w/o contrast. Neurosurgery to f/u on MRI once it's completed   Active Problems:   AKI (acute kidney injury) (HCC) Held ARB/ACE and diuretics Cr 0.65 this AM Recheck bmet in AM    Essential  hypertension BP remains soft this AM Cont to hold antihypertensives for now.     History of craniotomy   History of CVA with residual deficit -No meningeal signs, N/V, photophobia. -Discussed and reviewed presenting CT with Neurosurgery -MRI w w/o contrast ordered and pending per Neurosurgery recs. Dr. Lovell Sheehan to review MRI once it is completed     Hyperlipidemia Currently not on therapy. Follow-up with primary care provider.     Adjustment disorder with depressed mood Continue escitalopram 20 mg p.o. daily.   Subjective: Reports feeling much better today. Asking about going home  Physical Exam: Vitals:   02/21/23 1628 02/21/23 2220 02/22/23 0610 02/22/23 1401  BP: 100/68 107/68 99/78 99/68   Pulse: 100   88  Resp: 20 18 18 20   Temp: 98.7 F (37.1 C) 98.8 F (37.1 C) 98.5 F (36.9 C) 98.3 F (36.8 C)  TempSrc: Oral Oral Oral Oral  SpO2: 94% 92% 100% 98%  Weight:      Height:       General exam: Awake, laying in bed, in nad Respiratory system: Normal respiratory effort, no wheezing Cardiovascular system: regular rate, s1, s2 Gastrointestinal system: Soft, nondistended, positive BS Central nervous system: CN2-12 grossly intact, residual L sided weakness Extremities: Perfused, no clubbing Skin: Normal skin turgor, no notable skin lesions seen Psychiatry: Mood normal // no visual hallucinations   Data Reviewed:  Labs reviewed: Na 136, K 3.4, Cr 0.65, WBC 7.7, Hgb 12.4  Family Communication: Pt in room, family not at bedside  Disposition: Status is: Inpatient Remains inpatient appropriate because: Severity of illness  Planned Discharge Destination: Home     Author: Rickey Barbara, MD 02/22/2023 2:19 PM  For on call review  [email or web address]

## 2023-02-22 NOTE — Treatment Plan (Signed)
Pt seen, full note will follow. Briefly, had discussed with Neurosurgery who had recommended f/u MRI brain w w/o. Ordered and is pending

## 2023-02-23 DIAGNOSIS — I82409 Acute embolism and thrombosis of unspecified deep veins of unspecified lower extremity: Secondary | ICD-10-CM

## 2023-02-23 DIAGNOSIS — N179 Acute kidney failure, unspecified: Secondary | ICD-10-CM

## 2023-02-23 DIAGNOSIS — R6510 Systemic inflammatory response syndrome (SIRS) of non-infectious origin without acute organ dysfunction: Secondary | ICD-10-CM | POA: Diagnosis not present

## 2023-02-23 DIAGNOSIS — F4321 Adjustment disorder with depressed mood: Secondary | ICD-10-CM | POA: Diagnosis not present

## 2023-02-23 LAB — COMPREHENSIVE METABOLIC PANEL
ALT: 23 U/L (ref 0–44)
AST: 14 U/L — ABNORMAL LOW (ref 15–41)
Albumin: 2.9 g/dL — ABNORMAL LOW (ref 3.5–5.0)
Alkaline Phosphatase: 58 U/L (ref 38–126)
Anion gap: 7 (ref 5–15)
BUN: 21 mg/dL — ABNORMAL HIGH (ref 6–20)
CO2: 25 mmol/L (ref 22–32)
Calcium: 9.1 mg/dL (ref 8.9–10.3)
Chloride: 105 mmol/L (ref 98–111)
Creatinine, Ser: 0.59 mg/dL (ref 0.44–1.00)
GFR, Estimated: >60 mL/min (ref >60)
Glucose, Bld: 117 mg/dL — ABNORMAL HIGH (ref 70–99)
Potassium: 3.5 mmol/L (ref 3.5–5.1)
Sodium: 137 mmol/L (ref 135–145)
Total Bilirubin: 0.3 mg/dL (ref 0.3–1.2)
Total Protein: 6.4 g/dL — ABNORMAL LOW (ref 6.5–8.1)

## 2023-02-23 LAB — CBC WITH DIFFERENTIAL/PLATELET
Abs Immature Granulocytes: 0.03 10*3/uL (ref 0.00–0.07)
Basophils Absolute: 0 10*3/uL (ref 0.0–0.1)
Basophils Relative: 0 %
Eosinophils Absolute: 0.2 10*3/uL (ref 0.0–0.5)
Eosinophils Relative: 3 %
HCT: 34.3 % — ABNORMAL LOW (ref 36.0–46.0)
Hemoglobin: 11.6 g/dL — ABNORMAL LOW (ref 12.0–15.0)
Immature Granulocytes: 0 %
Lymphocytes Relative: 39 %
Lymphs Abs: 3 10*3/uL (ref 0.7–4.0)
MCH: 28.9 pg (ref 26.0–34.0)
MCHC: 33.8 g/dL (ref 30.0–36.0)
MCV: 85.5 fL (ref 80.0–100.0)
Monocytes Absolute: 0.8 10*3/uL (ref 0.1–1.0)
Monocytes Relative: 10 %
Neutro Abs: 3.7 10*3/uL (ref 1.7–7.7)
Neutrophils Relative %: 48 %
Platelets: 172 10*3/uL (ref 150–400)
RBC: 4.01 MIL/uL (ref 3.87–5.11)
RDW: 13.6 % (ref 11.5–15.5)
WBC: 7.8 10*3/uL (ref 4.0–10.5)
nRBC: 0 % (ref 0.0–0.2)

## 2023-02-23 LAB — CULTURE, BLOOD (ROUTINE X 2)
Special Requests: ADEQUATE
Special Requests: ADEQUATE

## 2023-02-23 LAB — LEGIONELLA PNEUMOPHILA SEROGP 1 UR AG: L. pneumophila Serogp 1 Ur Ag: NEGATIVE

## 2023-02-23 MED ORDER — ACETAMINOPHEN 325 MG PO TABS
650.0000 mg | ORAL_TABLET | Freq: Four times a day (QID) | ORAL | Status: DC | PRN
  Administered 2023-02-24: 650 mg via ORAL
  Filled 2023-02-23: qty 2

## 2023-02-23 NOTE — Progress Notes (Signed)
Progress Note    Sheri Beck   FAO:[id number]  DOB: [birth year]  DOA: 02/21/2023     2 PCP: Karie Georges, MD  Initial CC: headache, fever   Hospital Course: Sheri Beck is a 57 y.o. 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  She denied chills, rhinorrhea, sore throat, wheezing or hemoptysis.  No chest pain, palpitations, diaphoresis, PND, orthopnea or pitting edema of the lower extremities.  No abdominal pain, nausea, emesis, diarrhea, constipation, melena or hematochezia.  No flank pain, dysuria, frequency or hematuria.  No polyuria, polydipsia, polyphagia or blurred vision   On initial CT, there is progressive right hemisphere encephalomalacia since February, with mild additional bulging of the meninges from the right craniectomy since that time.  She was febrile on admission.  She was admitted for further workup and evaluation with neurosurgery.  Repeat MRI brain was obtained which showed no acute abnormalities.  Prior large area of encephalomalacia again noted at prior site involving right MCA territory infarct.  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  MRI was reviewed by neurosurgery with no need for intervention.  She was recommended for outpatient follow-up in approximately 2 months after discharge.  Due to fever, she had been started on antibiotics and blood cultures were obtained.  ID was also consulted and antibiotics were held.  Blood culture grew 1/4 bottles Staph hominis.  Interval History:  No  events overnight.  Significant other present in room this morning also.  Patient still having intermittent pains in her left hand which seem to be chronic; she is on gabapentin for this.  No further fevers noted.  Headache is also improved.  Assessment and Plan:  SIRS (systemic inflammatory response syndrome) (HCC) -No obvious source of infection. -ID followed - blood cultures grew 1/4 Staph hominis; suspected contamination  - keep off abx - no further workup at this time   History of craniotomy History of CVA with residual left hemiparesis -No meningeal signs, N/V, photophobia. - repeated MRI brain; reviewed by neurosurgery; no intervention needed - outpatient followup in ~2 months  AKI (acute kidney injury) (HCC) - resolved  Held ARB/ACE and diuretics - normalized    Essential hypertension -BP remains normal without medications   Hyperlipidemia Currently not on therapy. Follow-up with primary care provider.   Adjustment disorder with depressed mood Continue escitalopram 20 mg p.o. daily.   Old records reviewed in assessment of this patient  Antimicrobials:   DVT prophylaxis:   apixaban (ELIQUIS) tablet 5 mg   Code Status:   Code Status: Full Code  Mobility Assessment (last 72 hours)     Mobility Assessment     Row Name 02/23/23 1011 02/22/23 2000 02/22/23 0710 02/21/23 1952 02/21/23 0930   Does patient have an order for bedrest or is patient medically unstable No - Continue assessment No - Continue assessment No - Continue assessment No - Continue assessment No - Continue assessment   What is the highest level of mobility based on the progressive mobility assessment? Level 1 (Bedfast) - Unable to balance while sitting on edge of bed Level 1 (Bedfast) - Unable to  balance while sitting on edge of bed Level 1 (Bedfast) - Unable to balance while sitting on edge of bed Level 1 (Bedfast) - Unable to balance while sitting on edge of bed Level 1 (Bedfast) - Unable to balance  while sitting on edge of bed   Is the above level different from baseline mobility prior to current illness? No - Consider discontinuing PT/OT Yes - Recommend PT order No - Consider discontinuing PT/OT Yes - Recommend PT order No - Consider discontinuing PT/OT            Barriers to discharge: awaiting home DME Disposition Plan:  home  Status is: Inpt  Objective: Blood pressure 107/82, pulse 86, temperature 98.3 F (36.8 C), temperature source Oral, resp. rate 20, height 5\' 2"  (1.575 m), weight 83.5 kg, SpO2 100 %.  Examination:  Physical Exam Constitutional:      General: She is not in acute distress. HENT:     Head:     Comments: Right craniectomy appreciated    Mouth/Throat:     Mouth: Mucous membranes are moist.  Eyes:     Extraocular Movements: Extraocular movements intact.  Cardiovascular:     Rate and Rhythm: Normal rate and regular rhythm.  Pulmonary:     Effort: Pulmonary effort is normal.     Breath sounds: Normal breath sounds.  Abdominal:     General: Bowel sounds are normal. There is no distension.     Palpations: Abdomen is soft.     Tenderness: There is no abdominal tenderness.  Musculoskeletal:        General: Tenderness (left hand TTP) present.     Cervical back: Normal range of motion and neck supple.  Skin:    General: Skin is warm and dry.  Neurological:     Mental Status: She is alert.     Comments: Left hemiparesis appreciated with contracted left hand  Psychiatric:        Mood and Affect: Mood normal.      Consultants:  Neurosurgery ID  Procedures:    Data Reviewed: Results for orders placed or performed during the hospital encounter of 02/21/23 (from the past 24 hour(s))  CBC with Differential     Status: Abnormal   Collection Time: 02/23/23  5:30 AM  Result Value Ref Range   WBC 7.8 4.0 - 10.5 K/uL   RBC 4.01 3.87 - 5.11 MIL/uL   Hemoglobin 11.6 (L) 12.0 - 15.0 g/dL   HCT 16.1 (L) 09.6 - 04.5 %   MCV 85.5 80.0 - 100.0 fL   MCH  28.9 26.0 - 34.0 pg   MCHC 33.8 30.0 - 36.0 g/dL   RDW 40.9 81.1 - 91.4 %   Platelets 172 150 - 400 K/uL   nRBC 0.0 0.0 - 0.2 %   Neutrophils Relative % 48 %   Neutro Abs 3.7 1.7 - 7.7 K/uL   Lymphocytes Relative 39 %   Lymphs Abs 3.0 0.7 - 4.0 K/uL   Monocytes Relative 10 %   Monocytes Absolute 0.8 0.1 - 1.0 K/uL   Eosinophils Relative 3 %   Eosinophils Absolute 0.2 0.0 - 0.5 K/uL   Basophils Relative 0 %   Basophils Absolute 0.0 0.0 - 0.1 K/uL   Immature Granulocytes 0 %   Abs Immature Granulocytes 0.03 0.00 - 0.07 K/uL  Comprehensive metabolic panel     Status: Abnormal   Collection Time: 02/23/23  5:30 AM  Result Value Ref Range   Sodium 137 135 - 145  mmol/L   Potassium 3.5 3.5 - 5.1 mmol/L   Chloride 105 98 - 111 mmol/L   CO2 25 22 - 32 mmol/L   Glucose, Bld 117 (H) 70 - 99 mg/dL   BUN 21 (H) 6 - 20 mg/dL   Creatinine, Ser 1.61 0.44 - 1.00 mg/dL   Calcium 9.1 8.9 - 09.6 mg/dL   Total Protein 6.4 (L) 6.5 - 8.1 g/dL   Albumin 2.9 (L) 3.5 - 5.0 g/dL   AST 14 (L) 15 - 41 U/L   ALT 23 0 - 44 U/L   Alkaline Phosphatase 58 38 - 126 U/L   Total Bilirubin 0.3 0.3 - 1.2 mg/dL   GFR, Estimated >04 >54 mL/min   Anion gap 7 5 - 15    I have reviewed pertinent nursing notes, vitals, labs, and images as necessary. I have ordered labwork to follow up on as indicated.  I have reviewed the last notes from staff over past 24 hours. I have discussed patient's care plan and test results with nursing staff, CM/SW, and other staff as appropriate.  Time spent: Greater than 50% of the 55 minute visit was spent in counseling/coordination of care for the patient as laid out in the A&P.   LOS: 2 days   Lewie Chamber, MD [facility] 02/23/2023, 1:55 PM

## 2023-02-23 NOTE — TOC Progression Note (Addendum)
Transition of Care [facility]) - Progression Note    Patient Details  Name: Sheri Beck MRN: [id number] Date of Birth: [birth year]  Transition of [facility]) CM/SW Contact  Karlynn Furrow, Olegario Messier, RN Phone Number: 02/23/2023, 11:46 AM  Clinical Narrative: [facility] rep Ashley[facility] rep Earna Coder awaiting ETA on delivery of dme to home-hospital bed,hoyer lift,rw(these items have already been processed PTA-no orders needed on this admission). PTAR needed @ d/c once dme is in the home. -2:37p-Received call from [facility] rep Zachary-they are not able to provide dme d/t OON.will contact another DME company if can manage dme-MD notified to place orders for Hospital bed,hoyer lift,rw. 3:55p-Faxed w/confirmation dme orders to [facility] fax#[phone] rep [employer] fax#[phone]-await response from either DME if able to accept.    Expected Discharge Plan: Home w Home Health Services Barriers to Discharge: Continued Medical Work up  Expected Discharge Plan and Services   Discharge Planning Services: CM Consult Post Acute Care Choice: Home Health Living arrangements for the past 2 months: Single Family Home                 DME Arranged: Walker rolling, Hospital bed, Other see comment Michiel Sites lift) DME Agency: [facility] Date DME Agency Contacted: 02/23/23 Time DME Agency Contacted: [phone] Representative spoke with at DME Agency: Earna Coder HH Arranged: RN, PT, OT, Social Work [employer] Agency: [facility] Date HH Agency Contacted: 02/23/23 Time HH Agency Contacted: [phone] Representative spoke with at [facility] Agency: Morrie Sheldon   Social Determinants of Health (SDOH) Interventions SDOH Screenings   Food Insecurity: No Food Insecurity (02/21/2023)  Housing: Low Risk  (02/21/2023)  Transportation Needs: No Transportation Needs (02/21/2023)  Utilities: Not At Risk (02/21/2023)  Depression (PHQ2-9): High Risk (01/04/2023)  Tobacco Use: Medium Risk (02/21/2023)     Readmission Risk Interventions    02/21/2023    4:54 PM  Readmission Risk Prevention Plan  Transportation Screening Complete  Medication Review (RN Care Manager) Complete  PCP or Specialist appointment within 3-5 days of discharge Complete  HRI or Home Care Consult Complete  SW Recovery Care/Counseling Consult Complete  Palliative Care Screening Not Applicable  Skilled Nursing Facility Not Applicable

## 2023-02-23 NOTE — TOC CM/SW Note (Signed)
    Durable Medical Equipment  (From admission, onward)           Start     Ordered   02/23/23 1503  For home use only DME Other see comment  Once       Comments: Michiel Sites lift  Question:  Length of Need  Answer:  6 Months   02/23/23 1503   02/23/23 1438  For home use only DME Walker rolling  Once       Question Answer Comment  Walker: With 5 Inch Wheels   Patient needs a walker to treat with the following condition CVA (cerebral vascular accident) (HCC)      02/23/23 1438   02/23/23 1437  For home use only DME Hospital bed  Once       Question Answer Comment  Length of Need Lifetime   Patient has (list medical condition): chronic residual left hemiparesis   The above medical condition requires: Patient requires the ability to reposition frequently   Head must be elevated greater than: 45 degrees   Bed type Semi-electric   Hoyer Lift Yes      02/23/23 1438

## 2023-02-23 NOTE — Consult Note (Signed)
Reason for Consult: Fever Referring Physician: Dr. Gomez Cleverly is an 57 y.o. female.  HPI: The patient is a 57 year old black female who suffered a large right MCA stroke.  I performed a decompressive craniectomy on her with placement of the bone flap in the right upper quadrant abdominal subcutaneous tissue on 10/16/2022.  I have seen her in the office in follow-up.  In fact she had a follow-up appointment scheduled to see me yesterday.  The patient was admitted to [facility] on 02/21/2023 with headaches, tachycardia, and fever.  She was worked up with a head CT which demonstrated her craniectomy defect.  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  ID has seen her.  I was contacted by Dr. Red Christians yesterday and suggested further workup of brain MRI with and without contrast.  The MRI was done without contrast but does not demonstrate any worrisome findings.  Presently the patient is alert and pleasant.  She denies pain. Past Medical History:  Diagnosis Date   Hypertension     Past Surgical History:  Procedure Laterality Date   CRANIOTOMY Right 10/16/2022   Procedure: RIGHT CRANIECTOMY;  Surgeon: Tressie Stalker, MD;  Location: [facility] OR;  Service: Neurosurgery;  Laterality: Right;    Family History  Problem Relation Age of Onset   Cancer Mother    Heart disease Father    Glaucoma Father    Varicose Veins Paternal Grandfather    Breast cancer Neg Hx     Social History:  reports that she quit smoking about 4 months ago. Her smoking use included cigarettes. She smoked an average of .1 packs per day. She has never used smokeless tobacco. She reports current alcohol use. She reports current drug use. Drug: Cocaine.  Allergies: No Known Allergies  Medications: I have reviewed the patient's current medications. Prior to Admission:  Medications Prior to Admission  Medication Sig Dispense Refill Last Dose   amLODipine (NORVASC) 10 MG tablet Take  0.5 tablets (5 mg total) by mouth daily. 90 tablet 0 02/20/2023   apixaban (ELIQUIS) 5 MG TABS tablet Take 1 tablet (5 mg total) by mouth 2 (two) times daily. 60 tablet 3 Past Month   busPIRone (BUSPAR) 7.5 MG tablet Take 7.5 mg by mouth 2 (two) times daily.   02/20/2023   Cholecalciferol (VITAMIN D3) 1.25 MG (50000 UT) CAPS Take 50,000 Units by mouth every 7 (seven) days.   unk   escitalopram (LEXAPRO) 20 MG tablet Take 1 tablet (20 mg total) by mouth daily. 30 tablet 0 02/20/2023   gabapentin (NEURONTIN) 300 MG capsule Take 1 capsule (300 mg total) by mouth 3 (three) times daily. (Patient taking differently: Take 300 mg by mouth See admin instructions. Take 300 mg by mouth at 8 AM, 12 NOON, and 8 PM) 90 capsule 0 02/20/2023   lisinopril-hydrochlorothiazide (ZESTORETIC) 20-12.5 MG tablet Take 1 tablet by mouth once daily 90 tablet 0 02/20/2023   methocarbamol (ROBAXIN) 500 MG tablet Take 250 mg by mouth every 8 (eight) hours as needed for muscle spasms.   unk   montelukast (SINGULAIR) 10 MG tablet Take 10 mg by mouth at bedtime.   unk   potassium chloride SA (KLOR-CON M) 20 MEQ tablet Take 1 tablet (20 mEq total) by mouth daily. 30 tablet 0 02/20/2023   promethazine (PHENERGAN) 12.5 MG tablet Take 1-2 tablets (12.5-25 mg total) by mouth every 4 (four) hours as needed for refractory nausea / vomiting. 30 tablet 0  unk   diclofenac Sodium (VOLTAREN) 1 % GEL Apply 2 g topically 4 (four) times daily as needed (apply to areas of pain and inflammation.). 350 g 5 unk   docusate sodium (COLACE) 100 MG capsule Take 100 mg by mouth 2 (two) times daily.   unk   hydrOXYzine (ATARAX) 25 MG tablet Take 1 tablet (25 mg total) by mouth at bedtime as needed for anxiety. 7 tablet 0 unk   neomycin-polymyxin-hydrocortisone (CORTISPORIN) OTIC solution Place 4 drops into the left ear 4 (four) times daily. 10 mL 0 unk   ondansetron (ZOFRAN) 4 MG tablet Take 1 tablet (4 mg total) by mouth every 4 (four) hours as needed for nausea  or vomiting. 20 tablet 0 unk   polyethylene glycol (MIRALAX / GLYCOLAX) 17 g packet Take 17 g by mouth daily as needed for moderate constipation. 14 each 0 unk   senna (SENOKOT) 8.6 MG tablet Take 1-2 tablets by mouth at bedtime as needed for constipation.   unk   Scheduled:  apixaban  5 mg Oral BID   escitalopram  20 mg Oral Daily   gabapentin  300 mg Oral TID   pantoprazole  40 mg Oral Daily   Continuous: WUJ:[id number] Sodium, gadobutrol, hydrOXYzine, mouth rinse, prochlorperazine Anti-infectives (From admission, onward)    Start     Dose/Rate Route Frequency Ordered Stop   02/21/23 0515  cefTRIAXone (ROCEPHIN) 2 g in sodium chloride 0.9 % 100 mL IVPB        2 g 200 mL/hr over 30 Minutes Intravenous  Once 02/21/23 0511 02/21/23 0608        Results for orders placed or performed during the hospital encounter of 02/21/23 (from the past 48 hour(s))  CBC with Differential     Status: Abnormal   Collection Time: 02/22/23  5:18 AM  Result Value Ref Range   WBC 7.7 4.0 - 10.5 K/uL   RBC 4.32 3.87 - 5.11 MIL/uL   Hemoglobin 12.4 12.0 - 15.0 g/dL   HCT 56.2 13.0 - 86.5 %   MCV 86.6 80.0 - 100.0 fL   MCH 28.7 26.0 - 34.0 pg   MCHC 33.2 30.0 - 36.0 g/dL   RDW 78.4 69.6 - 29.5 %   Platelets 144 (L) 150 - 400 K/uL   nRBC 0.0 0.0 - 0.2 %   Neutrophils Relative % 50 %   Neutro Abs 3.8 1.7 - 7.7 K/uL   Lymphocytes Relative 37 %   Lymphs Abs 2.9 0.7 - 4.0 K/uL   Monocytes Relative 10 %   Monocytes Absolute 0.8 0.1 - 1.0 K/uL   Eosinophils Relative 3 %   Eosinophils Absolute 0.2 0.0 - 0.5 K/uL   Basophils Relative 0 %   Basophils Absolute 0.0 0.0 - 0.1 K/uL   Immature Granulocytes 0 %   Abs Immature Granulocytes 0.02 0.00 - 0.07 K/uL    Comment: Performed at [facility], [address] [place], [place] [address]  Basic metabolic panel     Status: Abnormal   Collection Time: 02/22/23  5:18 AM  Result Value Ref Range   Sodium 136 135 - 145 mmol/L   Potassium  3.4 (L) 3.5 - 5.1 mmol/L   Chloride 104 98 - 111 mmol/L   CO2 24 22 - 32 mmol/L   Glucose, Bld 100 (H) 70 - 99 mg/dL    Comment: Glucose reference range applies only to samples taken after fasting for at least 8 hours.   BUN 26 (H) 6 -  20 mg/dL   Creatinine, Ser 7.82 0.44 - 1.00 mg/dL   Calcium 9.0 8.9 - 95.6 mg/dL   GFR, Estimated >21 >30 mL/min    Comment: (NOTE) Calculated using the CKD-EPI Creatinine Equation (2021)    Anion gap 8 5 - 15    Comment: Performed at [facility], [address], [place], [place] [address]  CBC with Differential     Status: Abnormal   Collection Time: 02/23/23  5:30 AM  Result Value Ref Range   WBC 7.8 4.0 - 10.5 K/uL   RBC 4.01 3.87 - 5.11 MIL/uL   Hemoglobin 11.6 (L) 12.0 - 15.0 g/dL   HCT 46.9 (L) 62.9 - 52.8 %   MCV 85.5 80.0 - 100.0 fL   MCH 28.9 26.0 - 34.0 pg   MCHC 33.8 30.0 - 36.0 g/dL   RDW 41.3 24.4 - 01.0 %   Platelets 172 150 - 400 K/uL   nRBC 0.0 0.0 - 0.2 %   Neutrophils Relative % 48 %   Neutro Abs 3.7 1.7 - 7.7 K/uL   Lymphocytes Relative 39 %   Lymphs Abs 3.0 0.7 - 4.0 K/uL   Monocytes Relative 10 %   Monocytes Absolute 0.8 0.1 - 1.0 K/uL   Eosinophils Relative 3 %   Eosinophils Absolute 0.2 0.0 - 0.5 K/uL   Basophils Relative 0 %   Basophils Absolute 0.0 0.0 - 0.1 K/uL   Immature Granulocytes 0 %   Abs Immature Granulocytes 0.03 0.00 - 0.07 K/uL    Comment: Performed at [facility], [address] [place], [place] [address]  Comprehensive metabolic panel     Status: Abnormal   Collection Time: 02/23/23  5:30 AM  Result Value Ref Range   Sodium 137 135 - 145 mmol/L   Potassium 3.5 3.5 - 5.1 mmol/L   Chloride 105 98 - 111 mmol/L   CO2 25 22 - 32 mmol/L   Glucose, Bld 117 (H) 70 - 99 mg/dL    Comment: Glucose reference range applies only to samples taken after fasting for at least 8 hours.   BUN 21 (H) 6 - 20 mg/dL   Creatinine, Ser 6.64 0.44 - 1.00 mg/dL   Calcium 9.1 8.9 -  40.3 mg/dL   Total Protein 6.4 (L) 6.5 - 8.1 g/dL   Albumin 2.9 (L) 3.5 - 5.0 g/dL   AST 14 (L) 15 - 41 U/L   ALT 23 0 - 44 U/L   Alkaline Phosphatase 58 38 - 126 U/L   Total Bilirubin 0.3 0.3 - 1.2 mg/dL   GFR, Estimated >47 >42 mL/min    Comment: (NOTE) Calculated using the CKD-EPI Creatinine Equation (2021)    Anion gap 7 5 - 15    Comment: Performed at [facility], [address], [place], [place] [address]    MR BRAIN WO CONTRAST  Result Date: 02/22/2023 CLINICAL DATA:  Headache and fever EXAM: MRI HEAD WITHOUT CONTRAST TECHNIQUE: Multiplanar, multiecho pulse sequences of the brain and surrounding structures were obtained without intravenous contrast. COMPARISON:  Head CT 02/21/2023 FINDINGS: Brain: Large area of encephalomalacia at the site of prior right MCA territory infarct. There is herniation of much of the brain parenchyma of the right hemisphere through a large craniectomy defect. This is unchanged since the prior CT. Chronic blood products throughout much of the right MCA territory. Punctate areas of diffusion restriction in the right MCA territory may be artifactual due to the presence of aging  blood products. No acute hemorrhage. Ex vacuo dilatation of the right lateral ventricle. Bilateral periventricular and deep white matter hyperintense T2-weighted signal. Old right cerebellar infarct. The midline structures are normal. Vascular: Major flow voids are preserved. Skull and upper cervical spine: Large right-sided craniectomy. Sinuses/Orbits:No paranasal sinus fluid levels or advanced mucosal thickening. No mastoid or middle ear effusion. Normal orbits. IMPRESSION: 1. No acute intracranial abnormality. Punctate foci of diffusion restriction in the right MCA territory are favored to be susceptibility artifacts related to aging blood products. 2. Large area of encephalomalacia at the site of prior right MCA territory infarct. There is herniation of much of the brain  parenchyma of the right hemisphere through a large craniectomy defect, unchanged since the prior CT. Electronically Signed   By: Deatra Robinson M.D.   On: 02/22/2023 20:39    ROS: As above Blood pressure 107/82, pulse 86, temperature 98.3 F (36.8 C), temperature source Oral, resp. rate 20, height 5\' 2"  (1.575 m), weight 83.5 kg, SpO2 100 %. Estimated body mass index is 33.67 kg/m as calculated from the following:   Height as of this encounter: 5\' 2"  (1.575 m).   Weight as of this encounter: 83.5 kg.  Physical Exam  General: An obese 57 year old left hemiplegic black female in no apparent distress  HEENT: The patient's right craniotomy incision is well-healed.  The scalp flap is still protuberant but soft.  Extraocular muscles are intact.  Thorax: Symmetric  Neck: Unremarkable  Abdomen: Obese and soft.  There is no tenderness.  Craniectomy flap insertion site is well-healed without signs of infection.  Neurologic exam: The patient is alert and oriented x 3.  She is left hemiplegic.  I reviewed the patient's brain MRI performed without contrast yesterday.  It demonstrates a large area of encephalomalacia in the right MCA distribution.  This protrudes through the craniectomy defect.  I do not see any clear signs of infection, abscess, empyema, etc.  She has some ventriculomegaly on the right secondary to her brain atrophy.  Assessment/Plan: Right MCA stroke, status post right decompressive craniectomy: I do not see any worrisome findings indicating infection on her head CT or brain MRI without contrast, however I would have expected for the swelling to have gone down a bit more by now..  Her craniectomy wounds look fine.  She does not need any neurosurgical intervention.  Please have her follow-up with me in the office in a couple months.  We are tentatively planning to replace her craniectomy flap once her swelling goes down.  I have answered all her questions.  Fever: Per ID and the  hospitalist. Cristi Loron 02/23/2023, 1:14 PM

## 2023-02-24 DIAGNOSIS — R6510 Systemic inflammatory response syndrome (SIRS) of non-infectious origin without acute organ dysfunction: Secondary | ICD-10-CM | POA: Diagnosis not present

## 2023-02-24 DIAGNOSIS — Z9889 Other specified postprocedural states: Secondary | ICD-10-CM

## 2023-02-24 DIAGNOSIS — F4321 Adjustment disorder with depressed mood: Secondary | ICD-10-CM | POA: Diagnosis not present

## 2023-02-24 NOTE — TOC Transition Note (Addendum)
Transition of Care [facility]) - CM/SW Discharge Note   Patient Details  Name: Sheri Beck MRN: [id number] Date of Birth: [birth year]  Transition of Care [facility]) CM/SW Contact:  Lanier Clam, RN Phone Number: 02/24/2023, 1:45 PM   Clinical Narrative:[employer] rep Mel Almond called about 2nd attempt to deliver dme to home within the hr-I called [name] to confirm someone is at the home-she confirmed [name] is in the home & aware to open door once [facility] Allegra arrives. [name] will call the floor to inform the nurse dme is in the home. [facility] fomrs are @ nsg station-PTAR to be called once dme is in the home. HHC already set up. No further CM needs.  -Received message from nurse-all dme in the home. PTAR called. No further CM needs.     Final next level of care: Home w Home Health Services Barriers to Discharge: No Barriers Identified   Patient Goals and CMS Choice CMS Medicare.gov Compare Post Acute Care list provided to:: Patient Represenative (must comment) Choice offered to / list presented to : Parent (Godmother)  Discharge Placement                  Patient to be transferred to facility by: PTAR Name of family member notified: [name] Hoskins(Godmother) Patient and family notified of of transfer: 02/24/23  Discharge Plan and Services Additional resources added to the After Visit Summary for     Discharge Planning Services: CM Consult Post Acute Care Choice: Home Health          DME Arranged: Walker rolling, Hospital bed, Other see comment Michiel Sites lift) DME Agency: [facility] Date DME Agency Contacted: 02/24/23 Time DME Agency Contacted: [id number] Representative spoke with at DME Agency: Earna Coder HH Arranged: RN, PT, OT, Social Work [employer] Agency: [facility] Date HH Agency Contacted: 02/23/23 Time HH Agency Contacted: [phone] Representative spoke with at [facility] Agency: Morrie Sheldon  Social Determinants of Health (SDOH) Interventions SDOH Screenings   Food Insecurity: No Food Insecurity  (02/21/2023)  Housing: Low Risk  (02/21/2023)  Transportation Needs: No Transportation Needs (02/21/2023)  Utilities: Not At Risk (02/21/2023)  Depression (PHQ2-9): High Risk (01/04/2023)  Tobacco Use: Medium Risk (02/21/2023)     Readmission Risk Interventions    02/21/2023    4:54 PM  Readmission Risk Prevention Plan  Transportation Screening Complete  Medication Review (RN Care Manager) Complete  PCP or Specialist appointment within 3-5 days of discharge Complete  HRI or Home Care Consult Complete  SW Recovery Care/Counseling Consult Complete  Palliative Care Screening Not Applicable  Skilled Nursing Facility Not Applicable

## 2023-02-24 NOTE — Progress Notes (Signed)
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 -She received a dose of ctx during this admission has not fevered again. CT head showed no acute abnormalities. Due to being on apixaban cannot get an LP at this time.  -I spoke with pt she states she thinks her HA are due to her sleeping position. HA is in the parietal region where she generally lays her head. No symptoms of meningitis(no photophbia, N/V), negative kernig's/brudinski, CT unrevealing. MRI brain no acute intracranial abnormality. 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 -CXR negative for acute abnormalities. UA benign. RVP negative  Recommendations:  - Pt has not fevered since initial temp , has been off of abx, HA resolved. No signs of infection currently as such no need for abx at this point - NSY engaged, noted that they expected swelling to have gone down a bit more, no worrisome findings f infection on imaging, F.U outpatient, plan on replacing craniectomy flap once swelling has gone down.  -Consider imaging left wrist if sign of swelling/increased pain, pt reports it is uncomfortable but no erythema, tenderness on exam.  -ID will sing off. Avoid acetaminophen for pain control  as it may mask fever.   Microbiology:   Antibiotics: Ctx 5/19 Cultures: Blood 5/19 1/2 staph hominis    SUBJECTIVE: Reports fever resolved.  Interval: Afebrile overnight.   Review of Systems: Review of Systems  All other systems reviewed and are negative.    Scheduled Meds:  apixaban  5 mg Oral BID   escitalopram  20 mg Oral Daily   gabapentin  300 mg Oral TID   pantoprazole  40 mg Oral Daily   Continuous Infusions: PRN Meds:.acetaminophen, diclofenac Sodium, gadobutrol, hydrOXYzine, mouth rinse, prochlorperazine No Known Allergies  OBJECTIVE: Vitals:   02/22/23 1956 02/23/23 0534 02/23/23 1253 02/23/23 2109  BP: 115/89 (!) 121/93 107/82 115/76  Pulse: 89 95 86 83  Resp: 18 20 20 20   Temp: 98.6 F (37 C) 98.6 F (37 C) 98.3 F (36.8 C) 99.2 F (37.3 C)  TempSrc: Oral Oral Oral Oral  SpO2: 99% 98% 100% 97%  Weight:      Height:       Body mass index is 33.67 kg/m.  Physical Exam Constitutional:      Appearance: Normal appearance.  HENT:     Head: Normocephalic and atraumatic.     Right Ear: Tympanic membrane normal.     Left Ear: Tympanic membrane normal.     Nose: Nose normal.     Mouth/Throat:     Mouth: Mucous membranes are moist.  Eyes:     Extraocular Movements: Extraocular movements intact.     Conjunctiva/sclera: Conjunctivae normal.     Pupils: Pupils are equal, round,  and reactive to light.  Cardiovascular:     Rate and Rhythm: Normal rate and regular rhythm.     Heart sounds: No murmur heard.    No friction rub. No gallop.  Pulmonary:     Effort: Pulmonary effort is normal.     Breath sounds: Normal breath sounds.  Abdominal:     General: Abdomen is flat.     Palpations: Abdomen is soft.  Skin:    General: Skin is warm and dry.  Neurological:     General: No focal deficit present.     Mental Status: She is alert and oriented to person, place, and time.  Psychiatric:        Mood and Affect: Mood normal.       Lab  Results Lab Results  Component Value Date   WBC 7.8 02/23/2023   HGB 11.6 (L) 02/23/2023   HCT 34.3 (L) 02/23/2023   MCV 85.5 02/23/2023   PLT 172 02/23/2023    Lab Results  Component Value Date   CREATININE 0.59 02/23/2023   BUN 21 (H) 02/23/2023   NA 137 02/23/2023   K 3.5 02/23/2023   CL 105 02/23/2023   CO2 25 02/23/2023    Lab Results  Component Value Date   ALT 23 02/23/2023   AST 14 (L) 02/23/2023   ALKPHOS 58 02/23/2023   BILITOT 0.3 02/23/2023        Danelle Earthly, MD [facility] 02/24/2023, 4:51 AM   I have personally spent 54 minutes involved in face-to-face and non-face-to-face activities for this patient on the day of the visit. Professional time spent includes the following activities: Preparing to see the patient (review of tests), Obtaining and/or reviewing separately obtained history (admission/discharge record), Performing a medically appropriate examination and/or evaluation , Ordering medications/tests/procedures, referring and communicating with other health care professionals, Documenting clinical information in the EMR, Independently interpreting results (not separately reported), Communicating results to the patient/family/caregiver, Counseling and educating the patient/family/caregiver and Care coordination (not separately reported).

## 2023-02-24 NOTE — Discharge Summary (Signed)
Physician Discharge Summary   Sheri Beck ZOX:[id number] DOB: 06/11/1966 DOA: 02/21/2023  PCP: Karie Georges, MD  Admit date: 02/21/2023 Discharge date: 02/24/2023  Admitted From: Home Disposition:  Home Discharging physician: Lewie Chamber, MD Barriers to discharge: Awaiting DME  Recommendations at discharge: Follow up with Dr. Lovell Sheehan in 1-2 months  Home Health: RN, PT, OT, SW Equipment/Devices: Walker rolling, Hospital bed, Other see comment Michiel Sites lift)   Discharge Condition: stable CODE STATUS: Full Diet recommendation:  Diet Orders (From admission, onward)     Start     Ordered   02/24/23 0000  Diet - low sodium heart healthy        02/24/23 1035   02/21/23 0952  Diet Heart Room service appropriate? Yes; Fluid consistency: Thin  Diet effective now       Question Answer Comment  Room service appropriate? Yes   Fluid consistency: Thin      02/21/23 0951            Hospital Course: Ms. Lako is a 57 y.o. 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  She denied chills, rhinorrhea, sore throat, wheezing or hemoptysis.  No chest pain, palpitations, diaphoresis, PND, orthopnea or pitting edema of the lower extremities.  No abdominal pain, nausea, emesis, diarrhea, constipation, melena or hematochezia.  No flank pain, dysuria, frequency or hematuria.  No polyuria, polydipsia, polyphagia or blurred vision   On initial CT, there is progressive right hemisphere encephalomalacia since February, with mild additional bulging of the meninges from the  right craniectomy since that time.  She was febrile on admission.  She was admitted for further workup and evaluation with neurosurgery.  Repeat MRI brain was obtained which showed no acute abnormalities.  Prior large area of encephalomalacia again noted at prior site involving right MCA territory infarct.  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  MRI was reviewed by neurosurgery with no need for intervention.  She was recommended for outpatient follow-up in approximately 2 months after discharge.  Due to fever, she had been started on antibiotics and blood cultures were obtained.  ID was also consulted and antibiotics were held.  Blood culture grew 1/4 bottles Staph hominis.  Assessment and Plan:  SIRS (systemic inflammatory response syndrome) (HCC) -No obvious source of infection. -ID followed - blood cultures grew 1/4 Staph hominis; suspected contamination  - keep off abx - no further workup at this time   History of craniotomy History of CVA with residual left hemiparesis -No meningeal signs, N/V, photophobia. - repeated MRI brain; reviewed by neurosurgery; no intervention needed - outpatient followup in ~2 months   AKI (acute kidney injury) (HCC) - resolved  Held ARB/ACE and diuretics - normalized    Essential hypertension -BP remains normal without medications   Hyperlipidemia Currently not on therapy. Follow-up with primary care provider.   Adjustment disorder with depressed mood Continue escitalopram 20 mg p.o. daily.    The patient's chronic medical conditions were treated accordingly per the patient's home medication regimen except as noted.  On day of discharge, patient was felt deemed stable for discharge. Patient/family member advised to  call PCP or come back to ER if needed.   Principal Diagnosis: SIRS (systemic inflammatory response syndrome) (HCC)  Discharge Diagnoses: Active Hospital Problems    Diagnosis Date Noted   SIRS (systemic inflammatory response syndrome) (HCC) 02/21/2023   History of craniotomy 02/21/2023   History of CVA with residual deficit 02/21/2023   AKI (acute kidney injury) (HCC) 02/21/2023   Headache 02/21/2023   Adjustment disorder with depressed mood 10/21/2022   Hyperlipidemia 09/28/2022   Essential hypertension 05/06/2020    Resolved Hospital Problems  No resolved problems to display.     Discharge Instructions     Diet - low sodium heart healthy   Complete by: As directed       Allergies as of 02/24/2023   No Known Allergies      Medication List     TAKE these medications    amLODipine 10 MG tablet Commonly known as: NORVASC Take 0.5 tablets (5 mg total) by mouth daily.   apixaban 5 MG Tabs tablet Commonly known as: ELIQUIS Take 1 tablet (5 mg total) by mouth 2 (two) times daily.   busPIRone 7.5 MG tablet Commonly known as: BUSPAR Take 7.5 mg by mouth 2 (two) times daily.   diclofenac Sodium 1 % Gel Commonly known as: Voltaren Apply 2 g topically 4 (four) times daily as needed (apply to areas of pain and inflammation.).   docusate sodium 100 MG capsule Commonly known as: COLACE Take 100 mg by mouth 2 (two) times daily.   escitalopram 20 MG tablet Commonly known as: Lexapro Take 1 tablet (20 mg total) by mouth daily.   gabapentin 300 MG capsule Commonly known as: NEURONTIN Take 1 capsule (300 mg total) by mouth 3 (three) times daily. What changed:  when to take this additional instructions   hydrOXYzine 25 MG tablet Commonly known as: ATARAX Take 1 tablet (25 mg total) by mouth at bedtime as needed for anxiety.   lisinopril-hydrochlorothiazide 20-12.5 MG tablet Commonly known as: ZESTORETIC Take 1 tablet by mouth once daily   methocarbamol 500 MG tablet Commonly known as: ROBAXIN Take 250 mg by mouth every 8 (eight) hours as needed for muscle spasms.   montelukast 10 MG tablet Commonly known as: SINGULAIR Take  10 mg by mouth at bedtime.   neomycin-polymyxin-hydrocortisone OTIC solution Commonly known as: CORTISPORIN Place 4 drops into the left ear 4 (four) times daily.   ondansetron 4 MG tablet Commonly known as: ZOFRAN Take 1 tablet (4 mg total) by mouth every 4 (four) hours as needed for nausea or vomiting.   polyethylene glycol 17 g packet Commonly known as: MIRALAX / GLYCOLAX Take 17 g by mouth daily as needed for moderate constipation.   potassium chloride SA 20 MEQ tablet Commonly known as: KLOR-CON M Take 1 tablet (20 mEq total) by mouth daily.   promethazine 12.5 MG tablet Commonly known as: PHENERGAN Take 1-2 tablets (12.5-25 mg total) by mouth every 4 (four) hours as needed for refractory nausea / vomiting.   senna 8.6 MG tablet Commonly known as: SENOKOT Take 1-2 tablets by mouth at bedtime as needed for constipation.   Vitamin D3 1.25 MG (50000 UT) Caps Take 50,000 Units by mouth every 7 (seven) days.               Durable Medical Equipment  (From admission, onward)           Start     Ordered   02/23/23 1503  For home use only DME Other  see comment  Once       Comments: Hoyer lift  Question:  Length of Need  Answer:  6 Months   02/23/23 1503   02/23/23 1438  For home use only DME Walker rolling  Once       Question Answer Comment  Walker: With 5 Inch Wheels   Patient needs a walker to treat with the following condition CVA (cerebral vascular accident) (HCC)      02/23/23 1438   02/23/23 1437  For home use only DME Hospital bed  Once       Question Answer Comment  Length of Need Lifetime   Patient has (list medical condition): chronic residual left hemiparesis   The above medical condition requires: Patient requires the ability to reposition frequently   Head must be elevated greater than: 45 degrees   Bed type Semi-electric   Hoyer Lift Yes      02/23/23 1438            Follow-up Information     [place], [facility]  Follow up.   Why: HH nursing/physical therapy/occupational therapy/social worker Contact information: [address] [place] [address] [phone]         [place], [facility] Follow up.   Why: hospital bed,hoyer lift,rolling walker(no orders needed this was processed from prior to this admission). Contact information: [address] [address] [phone]         Tressie Stalker, MD. Schedule an appointment as soon as possible for a visit in 2 month(s).   Specialty: Neurosurgery Contact information: [address] [place] [address] [phone]                No Known Allergies  Consultations: ID Neurosurgery  Procedures:   Discharge Exam: BP 108/83 (BP Location: Right Arm)   Pulse 84   Temp 98.4 F (36.9 C) (Oral)   Resp 16   Ht 5\' 2"  (1.575 m)   Wt 83.5 kg   SpO2 94%   BMI 33.67 kg/m  Physical Exam Constitutional:      General: She is not in acute distress. HENT:     Head:     Comments: Right craniectomy appreciated    Mouth/Throat:     Mouth: Mucous membranes are moist.  Eyes:     Extraocular Movements: Extraocular movements intact.  Cardiovascular:     Rate and Rhythm: Normal rate and regular rhythm.  Pulmonary:     Effort: Pulmonary effort is normal.     Breath sounds: Normal breath sounds.  Abdominal:     General: Bowel sounds are normal. There is no distension.     Palpations: Abdomen is soft.     Tenderness: There is no abdominal tenderness.  Musculoskeletal:        General: Tenderness (left hand TTP) present.     Cervical back: Normal range of motion and neck supple.  Skin:    General: Skin is warm and dry.  Neurological:     Mental Status: She is alert.     Comments: Left hemiparesis appreciated with contracted left hand  Psychiatric:        Mood and Affect: Mood normal.      The results of significant diagnostics from this hospitalization (including  imaging, microbiology, ancillary and laboratory) are listed below for reference.   Microbiology: Recent Results (from the past 240 hour(s))  Blood Culture (routine x 2)  Status: None (Preliminary result)   Collection Time: 02/21/23  4:13 AM   Specimen: BLOOD  Result Value Ref Range Status   Specimen Description   Final    BLOOD RIGHT ANTECUBITAL Performed at [facility], [address] [place], [place] [address]    Special Requests   Final    BOTTLES DRAWN AEROBIC AND ANAEROBIC Blood Culture adequate volume Performed at [facility], [address] [place], [place] [address]    Culture   Final    NO GROWTH 3 DAYS Performed at [facility], [address], [place], [place] [address]    Report Status PENDING  Incomplete  SARS Coronavirus 2 by RT PCR (hospital order, performed in [facility] hospital lab) *cepheid single result test* Anterior Nasal Swab     Status: None   Collection Time: 02/21/23  4:14 AM   Specimen: Anterior Nasal Swab  Result Value Ref Range Status   SARS Coronavirus 2 by RT PCR NEGATIVE NEGATIVE Final    Comment: (NOTE) SARS-CoV-2 target nucleic acids are NOT DETECTED.  The SARS-CoV-2 RNA is generally detectable in upper and lower respiratory specimens during the acute phase of infection. The lowest concentration of SARS-CoV-2 viral copies this assay can detect is 250 copies / mL. A negative result does not preclude SARS-CoV-2 infection and should not be used as the sole basis for treatment or other patient management decisions.  A negative result may occur with improper specimen collection / handling, submission of specimen other than nasopharyngeal swab, presence of viral mutation(s) within the areas targeted by this assay, and inadequate number of viral copies (<250 copies / mL). A negative result must be combined with clinical observations, patient history, and epidemiological information.  Fact Sheet for  Patients:   RoadLapTop.co.za  Fact Sheet for Healthcare Providers: http://kim-miller.com/  This test is not yet approved or  cleared by the [place] FDA and has been authorized for detection and/or diagnosis of SARS-CoV-2 by FDA under an Emergency Use Authorization (EUA).  This EUA will remain in effect (meaning this test can be used) for the duration of the COVID-19 declaration under Section 564(b)(1) of the Act, 21 U.S.C. section 360bbb-3(b)(1), unless the authorization is terminated or revoked sooner.  Performed at [facility], [address], [place], [place] [address]   Blood Culture (routine x 2)     Status: Abnormal   Collection Time: 02/21/23  4:50 AM   Specimen: BLOOD  Result Value Ref Range Status   Specimen Description   Final    BLOOD LEFT ANTECUBITAL Performed at [facility], [address] [place], [place] [address]    Special Requests   Final    BOTTLES DRAWN AEROBIC AND ANAEROBIC Blood Culture adequate volume Performed at [facility], [address], [place], [place] [address]    Culture  Setup Time   Final    GRAM POSITIVE COCCI IN CLUSTERS AEROBIC BOTTLE ONLY CRITICAL RESULT CALLED TO, READ BACK BY AND VERIFIED WITH: PHARMD AWeston Anna [id number] @1508  FH    Culture (A)  Final    STAPHYLOCOCCUS HOMINIS THE SIGNIFICANCE OF ISOLATING THIS ORGANISM FROM A SINGLE SET OF BLOOD CULTURES WHEN MULTIPLE SETS ARE DRAWN IS UNCERTAIN. PLEASE NOTIFY THE MICROBIOLOGY DEPARTMENT WITHIN ONE WEEK IF SPECIATION AND SENSITIVITIES ARE REQUIRED. Performed at [facility], [address] [place], [place] [address]    Report Status 02/23/2023 FINAL  Final  Blood Culture ID  Panel (Reflexed)     Status: Abnormal   Collection Time: 02/21/23  4:50 AM  Result Value Ref Range Status   Enterococcus faecalis NOT DETECTED NOT DETECTED Final   Enterococcus Faecium NOT  DETECTED NOT DETECTED Final   Listeria monocytogenes NOT DETECTED NOT DETECTED Final   Staphylococcus species DETECTED (A) NOT DETECTED Final    Comment: CRITICAL RESULT CALLED TO, READ BACK BY AND VERIFIED WITH: PHARMD AWeston Anna [id number] @1508  FH    Staphylococcus aureus (BCID) NOT DETECTED NOT DETECTED Final   Staphylococcus epidermidis NOT DETECTED NOT DETECTED Final   Staphylococcus lugdunensis NOT DETECTED NOT DETECTED Final   Streptococcus species NOT DETECTED NOT DETECTED Final   Streptococcus agalactiae NOT DETECTED NOT DETECTED Final   Streptococcus pneumoniae NOT DETECTED NOT DETECTED Final   Streptococcus pyogenes NOT DETECTED NOT DETECTED Final   A.calcoaceticus-baumannii NOT DETECTED NOT DETECTED Final   Bacteroides fragilis NOT DETECTED NOT DETECTED Final   Enterobacterales NOT DETECTED NOT DETECTED Final   Enterobacter cloacae complex NOT DETECTED NOT DETECTED Final   Escherichia coli NOT DETECTED NOT DETECTED Final   Klebsiella aerogenes NOT DETECTED NOT DETECTED Final   Klebsiella oxytoca NOT DETECTED NOT DETECTED Final   Klebsiella pneumoniae NOT DETECTED NOT DETECTED Final   Proteus species NOT DETECTED NOT DETECTED Final   Salmonella species NOT DETECTED NOT DETECTED Final   Serratia marcescens NOT DETECTED NOT DETECTED Final   Haemophilus influenzae NOT DETECTED NOT DETECTED Final   Neisseria meningitidis NOT DETECTED NOT DETECTED Final   Pseudomonas aeruginosa NOT DETECTED NOT DETECTED Final   Stenotrophomonas maltophilia NOT DETECTED NOT DETECTED Final   Candida albicans NOT DETECTED NOT DETECTED Final   Candida auris NOT DETECTED NOT DETECTED Final   Candida glabrata NOT DETECTED NOT DETECTED Final   Candida krusei NOT DETECTED NOT DETECTED Final   Candida parapsilosis NOT DETECTED NOT DETECTED Final   Candida tropicalis NOT DETECTED NOT DETECTED Final   Cryptococcus neoformans/gattii NOT DETECTED NOT DETECTED Final    Comment: Performed at [facility], [address] [place], [place] [address]  Respiratory (~20 pathogens) panel by PCR     Status: None   Collection Time: 02/21/23  9:59 AM   Specimen: Nasopharyngeal Swab; Respiratory  Result Value Ref Range Status   Adenovirus NOT DETECTED NOT DETECTED Final   Coronavirus 229E NOT DETECTED NOT DETECTED Final    Comment: (NOTE) The Coronavirus on the Respiratory Panel, DOES NOT test for the novel  Coronavirus (2019 nCoV)    Coronavirus HKU1 NOT DETECTED NOT DETECTED Final   Coronavirus NL63 NOT DETECTED NOT DETECTED Final   Coronavirus OC43 NOT DETECTED NOT DETECTED Final   Metapneumovirus NOT DETECTED NOT DETECTED Final   Rhinovirus / Enterovirus NOT DETECTED NOT DETECTED Final   Influenza A NOT DETECTED NOT DETECTED Final   Influenza B NOT DETECTED NOT DETECTED Final   Parainfluenza Virus 1 NOT DETECTED NOT DETECTED Final   Parainfluenza Virus 2 NOT DETECTED NOT DETECTED Final   Parainfluenza Virus 3 NOT DETECTED NOT DETECTED Final   Parainfluenza Virus 4 NOT DETECTED NOT DETECTED Final   Respiratory Syncytial Virus NOT DETECTED NOT DETECTED Final   Bordetella pertussis NOT DETECTED NOT DETECTED Final   Bordetella Parapertussis NOT DETECTED NOT DETECTED Final   Chlamydophila pneumoniae NOT DETECTED NOT DETECTED Final   Mycoplasma pneumoniae NOT DETECTED NOT DETECTED Final    Comment: Performed at [facility], [address] [place], [place] [address]  Labs: BNP (last 3 results) Recent Labs    10/31/22 2342  BNP 15.2   Basic Metabolic Panel: Recent Labs  Lab 02/21/23 0416 02/22/23 0518 02/23/23 0530  NA 136 136 137  K 4.2 3.4* 3.5  CL 104 104 105  CO2 21* 24 25  GLUCOSE 144* 100* 117*  BUN 27* 26* 21*  CREATININE 1.09* 0.65 0.59  CALCIUM 9.5 9.0 9.1   Liver Function Tests: Recent Labs  Lab 02/21/23 0416 02/23/23 0530  AST 32 14*  ALT 37 23  ALKPHOS 63 58  BILITOT 1.2 0.3  PROT 7.5 6.4*  ALBUMIN 3.6 2.9*   No results for input(s):  "LIPASE", "AMYLASE" in the last 168 hours. No results for input(s): "AMMONIA" in the last 168 hours. CBC: Recent Labs  Lab 02/21/23 0416 02/22/23 0518 02/23/23 0530  WBC 10.2 7.7 7.8  NEUTROABS 6.1 3.8 3.7  HGB 14.5 12.4 11.6*  HCT 43.7 37.4 34.3*  MCV 86.0 86.6 85.5  PLT 187 144* 172   Cardiac Enzymes: No results for input(s): "CKTOTAL", "CKMB", "CKMBINDEX", "TROPONINI" in the last 168 hours. BNP: Invalid input(s): "POCBNP" CBG: No results for input(s): "GLUCAP" in the last 168 hours. D-Dimer No results for input(s): "DDIMER" in the last 72 hours. Hgb A1c No results for input(s): "HGBA1C" in the last 72 hours. Lipid Profile No results for input(s): "CHOL", "HDL", "LDLCALC", "TRIG", "CHOLHDL", "LDLDIRECT" in the last 72 hours. Thyroid function studies No results for input(s): "TSH", "T4TOTAL", "T3FREE", "THYROIDAB" in the last 72 hours.  Invalid input(s): "FREET3" Anemia work up No results for input(s): "VITAMINB12", "FOLATE", "FERRITIN", "TIBC", "IRON", "RETICCTPCT" in the last 72 hours. Urinalysis    Component Value Date/Time   COLORURINE YELLOW 02/21/2023 0349   APPEARANCEUR HAZY (A) 02/21/2023 0349   LABSPEC 1.028 02/21/2023 0349   PHURINE 5.0 02/21/2023 0349   GLUCOSEU NEGATIVE 02/21/2023 0349   HGBUR NEGATIVE 02/21/2023 0349   BILIRUBINUR NEGATIVE 02/21/2023 0349   KETONESUR NEGATIVE 02/21/2023 0349   PROTEINUR 30 (A) 02/21/2023 0349   NITRITE NEGATIVE 02/21/2023 0349   LEUKOCYTESUR SMALL (A) 02/21/2023 0349   Sepsis Labs Recent Labs  Lab 02/21/23 0416 02/22/23 0518 02/23/23 0530  WBC 10.2 7.7 7.8   Microbiology Recent Results (from the past 240 hour(s))  Blood Culture (routine x 2)     Status: None (Preliminary result)   Collection Time: 02/21/23  4:13 AM   Specimen: BLOOD  Result Value Ref Range Status   Specimen Description   Final    BLOOD RIGHT ANTECUBITAL Performed at [facility], [address] [place], [place]  [address]    Special Requests   Final    BOTTLES DRAWN AEROBIC AND ANAEROBIC Blood Culture adequate volume Performed at [facility], [address], [place], [place] [address]    Culture   Final    NO GROWTH 3 DAYS Performed at [facility], [address] [place], [place] [address]    Report Status PENDING  Incomplete  SARS Coronavirus 2 by RT PCR (hospital order, performed in [facility] hospital lab) *cepheid single result test* Anterior Nasal Swab     Status: None   Collection Time: 02/21/23  4:14 AM   Specimen: Anterior Nasal Swab  Result Value Ref Range Status   SARS Coronavirus 2 by RT PCR NEGATIVE NEGATIVE Final    Comment: (NOTE) SARS-CoV-2 target nucleic acids are NOT DETECTED.  The SARS-CoV-2 RNA is generally detectable in upper and lower respiratory specimens during the  acute phase of infection. The lowest concentration of SARS-CoV-2 viral copies this assay can detect is 250 copies / mL. A negative result does not preclude SARS-CoV-2 infection and should not be used as the sole basis for treatment or other patient management decisions.  A negative result may occur with improper specimen collection / handling, submission of specimen other than nasopharyngeal swab, presence of viral mutation(s) within the areas targeted by this assay, and inadequate number of viral copies (<250 copies / mL). A negative result must be combined with clinical observations, patient history, and epidemiological information.  Fact Sheet for Patients:   RoadLapTop.co.za  Fact Sheet for Healthcare Providers: http://kim-miller.com/  This test is not yet approved or  cleared by the [place] FDA and has been authorized for detection and/or diagnosis of SARS-CoV-2 by FDA under an Emergency Use Authorization (EUA).  This EUA will remain in effect (meaning this test can be used) for the duration of the COVID-19 declaration  under Section 564(b)(1) of the Act, 21 U.S.C. section 360bbb-3(b)(1), unless the authorization is terminated or revoked sooner.  Performed at [facility], [address], [place], [place] [address]   Blood Culture (routine x 2)     Status: Abnormal   Collection Time: 02/21/23  4:50 AM   Specimen: BLOOD  Result Value Ref Range Status   Specimen Description   Final    BLOOD LEFT ANTECUBITAL Performed at [facility], [address], [place], [place] [address]    Special Requests   Final    BOTTLES DRAWN AEROBIC AND ANAEROBIC Blood Culture adequate volume Performed at [facility], [address], [place], [place] [address]    Culture  Setup Time   Final    GRAM POSITIVE COCCI IN CLUSTERS AEROBIC BOTTLE ONLY CRITICAL RESULT CALLED TO, READ BACK BY AND VERIFIED WITH: PHARMD AWeston Anna [id number] @1508  FH    Culture (A)  Final    STAPHYLOCOCCUS HOMINIS THE SIGNIFICANCE OF ISOLATING THIS ORGANISM FROM A SINGLE SET OF BLOOD CULTURES WHEN MULTIPLE SETS ARE DRAWN IS UNCERTAIN. PLEASE NOTIFY THE MICROBIOLOGY DEPARTMENT WITHIN ONE WEEK IF SPECIATION AND SENSITIVITIES ARE REQUIRED. Performed at [facility], [address], [place], [place] [address]    Report Status 02/23/2023 FINAL  Final  Blood Culture ID Panel (Reflexed)     Status: Abnormal   Collection Time: 02/21/23  4:50 AM  Result Value Ref Range Status   Enterococcus faecalis NOT DETECTED NOT DETECTED Final   Enterococcus Faecium NOT DETECTED NOT DETECTED Final   Listeria monocytogenes NOT DETECTED NOT DETECTED Final   Staphylococcus species DETECTED (A) NOT DETECTED Final    Comment: CRITICAL RESULT CALLED TO, READ BACK BY AND VERIFIED WITH: PHARMD AWeston Anna [id number] @1508  FH    Staphylococcus aureus (BCID) NOT DETECTED NOT DETECTED Final   Staphylococcus epidermidis NOT DETECTED NOT DETECTED Final   Staphylococcus lugdunensis NOT DETECTED NOT DETECTED Final    Streptococcus species NOT DETECTED NOT DETECTED Final   Streptococcus agalactiae NOT DETECTED NOT DETECTED Final   Streptococcus pneumoniae NOT DETECTED NOT DETECTED Final   Streptococcus pyogenes NOT DETECTED NOT DETECTED Final   A.calcoaceticus-baumannii NOT DETECTED NOT DETECTED Final   Bacteroides fragilis NOT DETECTED NOT DETECTED Final   Enterobacterales NOT DETECTED NOT DETECTED Final   Enterobacter cloacae complex NOT DETECTED NOT DETECTED Final   Escherichia coli NOT DETECTED NOT DETECTED Final   Klebsiella aerogenes NOT DETECTED NOT DETECTED Final   Klebsiella oxytoca NOT  DETECTED NOT DETECTED Final   Klebsiella pneumoniae NOT DETECTED NOT DETECTED Final   Proteus species NOT DETECTED NOT DETECTED Final   Salmonella species NOT DETECTED NOT DETECTED Final   Serratia marcescens NOT DETECTED NOT DETECTED Final   Haemophilus influenzae NOT DETECTED NOT DETECTED Final   Neisseria meningitidis NOT DETECTED NOT DETECTED Final   Pseudomonas aeruginosa NOT DETECTED NOT DETECTED Final   Stenotrophomonas maltophilia NOT DETECTED NOT DETECTED Final   Candida albicans NOT DETECTED NOT DETECTED Final   Candida auris NOT DETECTED NOT DETECTED Final   Candida glabrata NOT DETECTED NOT DETECTED Final   Candida krusei NOT DETECTED NOT DETECTED Final   Candida parapsilosis NOT DETECTED NOT DETECTED Final   Candida tropicalis NOT DETECTED NOT DETECTED Final   Cryptococcus neoformans/gattii NOT DETECTED NOT DETECTED Final    Comment: Performed at [facility], [address] [place], [place] [address]  Respiratory (~20 pathogens) panel by PCR     Status: None   Collection Time: 02/21/23  9:59 AM   Specimen: Nasopharyngeal Swab; Respiratory  Result Value Ref Range Status   Adenovirus NOT DETECTED NOT DETECTED Final   Coronavirus 229E NOT DETECTED NOT DETECTED Final    Comment: (NOTE) The Coronavirus on the Respiratory Panel, DOES NOT test for the novel  Coronavirus (2019 nCoV)     Coronavirus HKU1 NOT DETECTED NOT DETECTED Final   Coronavirus NL63 NOT DETECTED NOT DETECTED Final   Coronavirus OC43 NOT DETECTED NOT DETECTED Final   Metapneumovirus NOT DETECTED NOT DETECTED Final   Rhinovirus / Enterovirus NOT DETECTED NOT DETECTED Final   Influenza A NOT DETECTED NOT DETECTED Final   Influenza B NOT DETECTED NOT DETECTED Final   Parainfluenza Virus 1 NOT DETECTED NOT DETECTED Final   Parainfluenza Virus 2 NOT DETECTED NOT DETECTED Final   Parainfluenza Virus 3 NOT DETECTED NOT DETECTED Final   Parainfluenza Virus 4 NOT DETECTED NOT DETECTED Final   Respiratory Syncytial Virus NOT DETECTED NOT DETECTED Final   Bordetella pertussis NOT DETECTED NOT DETECTED Final   Bordetella Parapertussis NOT DETECTED NOT DETECTED Final   Chlamydophila pneumoniae NOT DETECTED NOT DETECTED Final   Mycoplasma pneumoniae NOT DETECTED NOT DETECTED Final    Comment: Performed at [facility], [address], [place], [place] [address]    Procedures/Studies: MR BRAIN WO CONTRAST  Result Date: 02/22/2023 CLINICAL DATA:  Headache and fever EXAM: MRI HEAD WITHOUT CONTRAST TECHNIQUE: Multiplanar, multiecho pulse sequences of the brain and surrounding structures were obtained without intravenous contrast. COMPARISON:  Head CT 02/21/2023 FINDINGS: Brain: Large area of encephalomalacia at the site of prior right MCA territory infarct. There is herniation of much of the brain parenchyma of the right hemisphere through a large craniectomy defect. This is unchanged since the prior CT. Chronic blood products throughout much of the right MCA territory. Punctate areas of diffusion restriction in the right MCA territory may be artifactual due to the presence of aging blood products. No acute hemorrhage. Ex vacuo dilatation of the right lateral ventricle. Bilateral periventricular and deep white matter hyperintense T2-weighted signal. Old right cerebellar infarct. The midline structures are normal.  Vascular: Major flow voids are preserved. Skull and upper cervical spine: Large right-sided craniectomy. Sinuses/Orbits:No paranasal sinus fluid levels or advanced mucosal thickening. No mastoid or middle ear effusion. Normal orbits. IMPRESSION: 1. No acute intracranial abnormality. Punctate foci of diffusion restriction in the right MCA territory are favored to be susceptibility artifacts related to aging blood products. 2. Large  area of encephalomalacia at the site of prior right MCA territory infarct. There is herniation of much of the brain parenchyma of the right hemisphere through a large craniectomy defect, unchanged since the prior CT. Electronically Signed   By: Deatra Robinson M.D.   On: 02/22/2023 20:39   DG Chest Port 1 View  Result Date: 02/21/2023 CLINICAL DATA:  57 year old female with fever, headache. 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 EXAM: PORTABLE CHEST 1 VIEW COMPARISON:  Portable chest 11/09/2022 and earlier. FINDINGS: Portable AP semi upright view at 0501 hours. Mildly lower lung volumes. Allowing for portable technique the lungs are clear. No pneumothorax or pleural effusion. Mediastinal contours are stable and within normal limits. Visualized tracheal air column is within normal limits. Negative visible bowel gas. No acute osseous abnormality identified. IMPRESSION: Negative portable chest. Electronically Signed   By: Odessa Fleming M.D.   On: 02/21/2023 05:58   CT Head Wo Contrast  Result Date: 02/21/2023 CLINICAL DATA:  57 year old female with fever, headache. History of right MCA infarct, craniectomy. EXAM: CT HEAD WITHOUT CONTRAST TECHNIQUE: Contiguous axial images were obtained from the base of the skull through the vertex without intravenous contrast. RADIATION DOSE REDUCTION: This exam was performed according to the departmental dose-optimization program which includes automated exposure control, adjustment of the mA and/or kV according to patient size and/or use of  iterative reconstruction technique. COMPARISON:  Head CT 11/09/2022 and earlier. FINDINGS: Brain: Progressive right hemisphere encephalomalacia since February, complete right MCA territory involvement and some watershed area involvement also. Mild progressive ballooning of the meninges from the right craniectomy since that time. Underlying simple cystic encephalomalacia of the brain parenchyma there now. Associated ex vacuo enlargement of the right lateral ventricle. No significant intracranial mass effect. No other ventriculomegaly. No acute intracranial hemorrhage. Stable gray-white differentiation outside of the right hemisphere including small chronic right cerebellar infarct. No new cortically based infarct identified. Vascular: No acute suspicious intracranial vascular hyperdensity. Skull: Right craniectomy redemonstrated. No new osseous abnormality. Sinuses/Orbits: Visualized paranasal sinuses and mastoids are stable and well aerated. Other: Postoperative changes to the right scalp. Orbits appear stable and negative. IMPRESSION: 1. No acute intracranial abnormality. 2. Progressive right hemisphere encephalomalacia since February, with mild additional bulging of the meninges from the right craniectomy since that time. No significant intracranial mass effect. Electronically Signed   By: Odessa Fleming M.D.   On: 02/21/2023 05:57     Time coordinating discharge: Over 30 minutes    Lewie Chamber, MD  [facility] 02/24/2023, 3:29 PM

## 2023-02-25 LAB — CULTURE, BLOOD (ROUTINE X 2)

## 2023-02-26 LAB — CULTURE, BLOOD (ROUTINE X 2)

## 2023-02-28 ENCOUNTER — Emergency Department (HOSPITAL_COMMUNITY)
Admission: EM | Admit: 2023-02-28 | Discharge: 2023-02-28 | Disposition: A | Discharge status: Home / Self Care | Payer: BLUE CROSS/BLUE SHIELD | Attending: Emergency Medicine | Admitting: Emergency Medicine

## 2023-02-28 ENCOUNTER — Encounter (HOSPITAL_COMMUNITY): Payer: Self-pay | Admitting: Emergency Medicine

## 2023-02-28 ENCOUNTER — Other Ambulatory Visit: Payer: Self-pay

## 2023-02-28 DIAGNOSIS — Z760 Encounter for issue of repeat prescription: Secondary | ICD-10-CM | POA: Diagnosis present

## 2023-02-28 MED ORDER — ESCITALOPRAM OXALATE 10 MG PO TABS
20.0000 mg | ORAL_TABLET | Freq: Every day | ORAL | Status: DC
  Administered 2023-02-28: 20 mg via ORAL
  Filled 2023-02-28: qty 2

## 2023-02-28 MED ORDER — AMLODIPINE BESYLATE 5 MG PO TABS: 5.0000 mg | ORAL_TABLET | Freq: Every day | ORAL | Status: DC

## 2023-02-28 MED ORDER — LISINOPRIL 10 MG PO TABS
20.0000 mg | ORAL_TABLET | Freq: Every day | ORAL | Status: DC
  Administered 2023-02-28: 20 mg via ORAL
  Filled 2023-02-28: qty 2

## 2023-02-28 MED ORDER — METHOCARBAMOL 500 MG PO TABS: 250.0000 mg | ORAL_TABLET | Freq: Three times a day (TID) | ORAL | Status: DC | PRN

## 2023-02-28 MED ORDER — GABAPENTIN 300 MG PO CAPS: 300.0000 mg | ORAL_CAPSULE | ORAL | Status: DC

## 2023-02-28 MED ORDER — APIXABAN 5 MG PO TABS
5.0000 mg | ORAL_TABLET | Freq: Two times a day (BID) | ORAL | Status: DC
  Administered 2023-02-28: 5 mg via ORAL
  Filled 2023-02-28 (×2): qty 1

## 2023-02-28 MED ORDER — AMLODIPINE BESYLATE 5 MG PO TABS
10.0000 mg | ORAL_TABLET | Freq: Every day | ORAL | Status: DC
  Administered 2023-02-28: 10 mg via ORAL
  Filled 2023-02-28: qty 2

## 2023-02-28 MED ORDER — LISINOPRIL-HYDROCHLOROTHIAZIDE 20-12.5 MG PO TABS: 1.0000 | ORAL_TABLET | Freq: Every day | ORAL | Status: DC

## 2023-02-28 MED ORDER — HYDROCHLOROTHIAZIDE 12.5 MG PO TABS
12.5000 mg | ORAL_TABLET | Freq: Every day | ORAL | Status: DC
  Administered 2023-02-28: 12.5 mg via ORAL
  Filled 2023-02-28: qty 1

## 2023-02-28 MED ORDER — BUSPIRONE HCL 5 MG PO TABS
7.5000 mg | ORAL_TABLET | Freq: Two times a day (BID) | ORAL | Status: DC
  Administered 2023-02-28: 7.5 mg via ORAL
  Filled 2023-02-28 (×2): qty 1.5

## 2023-02-28 NOTE — Social Work (Signed)
This CSW received a call from patty/ RN concerning this patient upon review the patient was recently in the hospital. [facility] set the patient up with [facility]. This CSW reached out to the [facility] agency to follow up at this time the agency reported that the patient's insurance is pending AUTH. This CSW will follow up tomorrow to see if the patient;s AUTH was approved to ensure that someone will come to see the patient. At this time there are no other TOC needs.

## 2023-02-28 NOTE — ED Notes (Addendum)
Pt wanted me to call her brother to pick her up I talked to the brother and he said he will not be able to  pick her up because he just had surgery on his hands and wont be able to lift her

## 2023-02-28 NOTE — ED Notes (Signed)
Pt called out due to having a bowel movement. Pt brief changed and peri care complete.

## 2023-02-28 NOTE — ED Provider Notes (Signed)
[place] EMERGENCY DEPARTMENT AT [facility] Provider Note   CSN: [id number] Arrival date & time: 02/28/23  1300     History  Chief Complaint  Patient presents with   Medication Refill    Sheri Beck is a 57 y.o. female.  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  Patient states that her physical therapy person came to see her today and when she told that person that she is now her medications the patient was sent here.  Patient has a caregiver who is a friend at home.  She does have services through home health agencies.  Patient notes a mild headache but is unchanged.  Review of her records show that she did have recent hospitalization for headaches and fever without a definitive source.  Patient states otherwise she feels at her baseline.  Presents via EMS       Home Medications Prior to Admission medications   Medication Sig Start Date End Date Taking? Authorizing Provider  amLODipine (NORVASC) 10 MG tablet Take 0.5 tablets (5 mg total) by mouth daily. 11/09/22   Tilden Fossa, MD  apixaban (ELIQUIS) 5 MG TABS tablet Take 1 tablet (5 mg total) by mouth 2 (two) times daily. 02/02/23   Karie Georges, MD  busPIRone (BUSPAR) 7.5 MG tablet Take 7.5 mg by mouth 2 (two) times daily.    [provider]  Cholecalciferol (VITAMIN D3) 1.25 MG (50000 UT) CAPS Take 50,000 Units by mouth every 7 (seven) days.    [provider]  diclofenac Sodium (VOLTAREN) 1 % GEL Apply 2 g topically 4 (four) times daily as needed (apply to areas of pain and inflammation.). 02/02/23   Karie Georges, MD  docusate sodium (COLACE) 100 MG capsule Take 100 mg by mouth 2 (two) times daily.    [provider]  escitalopram (LEXAPRO) 20 MG tablet Take 1 tablet (20 mg total) by mouth daily. 01/31/23   Nelwyn Salisbury, MD  gabapentin (NEURONTIN) 300 MG capsule Take 1 capsule (300 mg total)  by mouth 3 (three) times daily. Patient taking differently: Take 300 mg by mouth See admin instructions. Take 300 mg by mouth at 8 AM, 12 NOON, and 8 PM 01/31/23   Nelwyn Salisbury, MD  hydrOXYzine (ATARAX) 25 MG tablet Take 1 tablet (25 mg total) by mouth at bedtime as needed for anxiety. 11/09/22   Tilden Fossa, MD  lisinopril-hydrochlorothiazide (ZESTORETIC) 20-12.5 MG tablet Take 1 tablet by mouth once daily 10/05/22   Karie Georges, MD  methocarbamol (ROBAXIN) 500 MG tablet Take 250 mg by mouth every 8 (eight) hours as needed for muscle spasms.    [provider]  montelukast (SINGULAIR) 10 MG tablet Take 10 mg by mouth at bedtime.    [provider]  neomycin-polymyxin-hydrocortisone (CORTISPORIN) OTIC solution Place 4 drops into the left ear 4 (four) times daily. 01/31/23   Nelwyn Salisbury, MD  ondansetron (ZOFRAN) 4 MG tablet Take 1 tablet (4 mg total) by mouth every 4 (four) hours as needed for nausea or vomiting. 10/22/22   Val Eagle D, NP  polyethylene glycol (MIRALAX / GLYCOLAX) 17 g packet Take 17 g by mouth daily as needed for moderate constipation. 10/22/22   Val Eagle D, NP  potassium chloride SA (KLOR-CON M) 20 MEQ tablet Take 1 tablet (20 mEq total) by mouth daily. 01/07/23   Karie Georges, MD  promethazine (  PHENERGAN) 12.5 MG tablet Take 1-2 tablets (12.5-25 mg total) by mouth every 4 (four) hours as needed for refractory nausea / vomiting. 10/22/22   Val Eagle D, NP  senna (SENOKOT) 8.6 MG tablet Take 1-2 tablets by mouth at bedtime as needed for constipation.    [provider]      Allergies    Patient has no known allergies.    Review of Systems   Review of Systems  All other systems reviewed and are negative.   Physical Exam Updated Vital Signs BP (!) 119/93   Pulse 75   Temp 98 F (36.7 C) (Oral)   Resp 18   SpO2 100%  Physical Exam Vitals and nursing note reviewed.  Constitutional:      General: She is not in acute  distress.    Appearance: Normal appearance. She is well-developed. She is not toxic-appearing.  HENT:     Head: Normocephalic and atraumatic.  Eyes:     General: Lids are normal.     Conjunctiva/sclera: Conjunctivae normal.     Pupils: Pupils are equal, round, and reactive to light.  Neck:     Thyroid: No thyroid mass.     Trachea: No tracheal deviation.  Cardiovascular:     Rate and Rhythm: Normal rate and regular rhythm.     Heart sounds: Normal heart sounds. No murmur heard.    No gallop.  Pulmonary:     Effort: Pulmonary effort is normal. No respiratory distress.     Breath sounds: Normal breath sounds. No stridor. No decreased breath sounds, wheezing, rhonchi or rales.  Abdominal:     General: There is no distension.     Palpations: Abdomen is soft.     Tenderness: There is no abdominal tenderness. There is no rebound.  Musculoskeletal:        General: No tenderness. Normal range of motion.     Cervical back: Normal range of motion and neck supple.  Skin:    General: Skin is warm and dry.     Findings: No abrasion or rash.  Neurological:     Mental Status: She is alert and oriented to person, place, and time. Mental status is at baseline.     GCS: GCS eye subscore is 4. GCS verbal subscore is 5. GCS motor subscore is 6.     Cranial Nerves: No cranial nerve deficit.     Sensory: No sensory deficit.     Comments: Left-sided weakness appreciated.  Baseline according to patient  Psychiatric:        Attention and Perception: Attention normal.        Speech: Speech normal.        Behavior: Behavior normal.     ED Results / Procedures / Treatments   Labs (all labs ordered are listed, but only abnormal results are displayed) Labs Reviewed - No data to display  EKG None  Radiology No results found.  Procedures Procedures    Medications Ordered in ED Medications  apixaban (ELIQUIS) tablet 5 mg (has no administration in time range)  amLODipine (NORVASC) tablet 5  mg (has no administration in time range)  busPIRone (BUSPAR) tablet 7.5 mg (has no administration in time range)  escitalopram (LEXAPRO) tablet 20 mg (has no administration in time range)  gabapentin (NEURONTIN) capsule 300 mg (has no administration in time range)  lisinopril-hydrochlorothiazide (ZESTORETIC) 20-12.5 MG per tablet 1 tablet (has no administration in time range)  methocarbamol (ROBAXIN) tablet 250 mg (has no administration in  time range)    ED Course/ Medical Decision Making/ A&P                             Medical Decision Making Risk Prescription drug management.   Patient presented requesting home dose of her medications.  Patient states that she has home health care.  She also has access to her home medications as well to.  Will discharge home        Final Clinical Impression(s) / ED Diagnoses Final diagnoses:  None    Rx / DC Orders ED Discharge Orders     None         Lorre Nick, MD 02/28/23 1435

## 2023-02-28 NOTE — Discharge Instructions (Signed)
Followup with your doctor as needed

## 2023-02-28 NOTE — ED Triage Notes (Signed)
Pt BIB [facility] from home, c/o lack of medication. Per EMS, her caregiver left her alone for two days. She has been unable to obtain proper medications. Hx of paraplegia so unable to care for herself without help. Physical Therapist stopped by for visit where pt was able to reach for phone and call out for help.

## 2023-03-01 ENCOUNTER — Telehealth: Payer: Self-pay | Admitting: *Deleted

## 2023-03-01 NOTE — Transitions of Care (Post Inpatient/ED Visit) (Signed)
03/01/2023  Name: Sheri Beck MRN: [id number] DOB: 08/27/1966  Today's TOC FU Call Status: Today's TOC FU Call Status:: Successful TOC FU Call Competed  Transition Care Management Follow-up Telephone Call Date of Discharge: 02/24/23 Discharge Facility: [facility]) Type of Discharge: Emergency Department How have you been since you were released from the hospital?: Better Any questions or concerns?: No  Items Reviewed: Did you receive and understand the discharge instructions provided?: Yes Any new allergies since your discharge?: No Dietary orders reviewed?: No Do you have support at home?: Yes People in Home: significant other  Medications Reviewed Today: Medications Reviewed Today     Reviewed by Salvatore Marvel, CPhT (Pharmacy Technician) on 02/21/23 at 1558  Med List Status: Complete   Medication Order Taking? Sig Documenting Provider Last Dose Status Informant  amLODipine (NORVASC) 10 MG tablet [id number] Yes Take 0.5 tablets (5 mg total) by mouth daily. Tilden Fossa, MD 02/20/2023 Active Mother  apixaban (ELIQUIS) 5 MG TABS tablet [id number] Yes Take 1 tablet (5 mg total) by mouth 2 (two) times daily. Karie Georges, MD Past Month Active Mother           Med Note Antony Madura, Ladona Mow Feb 21, 2023  3:56 PM) Patient's adoptive mother could only confirm she last had this within the past 30 days- on the day she left [facility]  busPIRone (BUSPAR) 7.5 MG tablet [id number] Yes Take 7.5 mg by mouth 2 (two) times daily. [provider] 02/20/2023 Active Mother  Cholecalciferol (VITAMIN D3) 1.25 MG (50000 UT) CAPS [id number] Yes Take 50,000 Units by mouth every 7 (seven) days. [provider] unk Active Mother  diclofenac Sodium (VOLTAREN) 1 % GEL [id number] No Apply 2 g topically 4 (four) times daily as needed (apply to areas of pain and inflammation.). Karie Georges, MD unk Active   docusate sodium (COLACE) 100 MG capsule [id number] No Take 100 mg by  mouth 2 (two) times daily. [provider] unk Active   escitalopram (LEXAPRO) 20 MG tablet [id number] Yes Take 1 tablet (20 mg total) by mouth daily. Nelwyn Salisbury, MD 02/20/2023 Active Mother  gabapentin (NEURONTIN) 300 MG capsule [id number] Yes Take 1 capsule (300 mg total) by mouth 3 (three) times daily.  Patient taking differently: Take 300 mg by mouth See admin instructions. Take 300 mg by mouth at 8 AM, 12 NOON, and 8 PM   Nelwyn Salisbury, MD 02/20/2023 Active Mother  hydrOXYzine (ATARAX) 25 MG tablet [id number] No Take 1 tablet (25 mg total) by mouth at bedtime as needed for anxiety. Tilden Fossa, MD unk Active   lisinopril-hydrochlorothiazide (ZESTORETIC) 20-12.5 MG tablet [id number] Yes Take 1 tablet by mouth once daily Karie Georges, MD 02/20/2023 Active Mother  methocarbamol (ROBAXIN) 500 MG tablet [id number] Yes Take 250 mg by mouth every 8 (eight) hours as needed for muscle spasms. [provider] unk Active Mother  montelukast (SINGULAIR) 10 MG tablet [id number] Yes Take 10 mg by mouth at bedtime. [provider] unk Active Mother  neomycin-polymyxin-hydrocortisone (CORTISPORIN) OTIC solution [id number] No Place 4 drops into the left ear 4 (four) times daily. Nelwyn Salisbury, MD unk Active   ondansetron [facility]) 4 MG tablet [id number] No Take 1 tablet (4 mg total) by mouth every 4 (four) hours as needed for nausea or vomiting. Val Eagle D, NP unk Active   polyethylene glycol (MIRALAX / GLYCOLAX) 17 g packet [id number] No Take 17 g by mouth daily as needed for  moderate constipation. Val Eagle D, NP unk Active   potassium chloride SA (KLOR-CON M) 20 MEQ tablet [id number] Yes Take 1 tablet (20 mEq total) by mouth daily. Karie Georges, MD 02/20/2023 Active Mother  promethazine (PHENERGAN) 12.5 MG tablet [id number] Yes Take 1-2 tablets (12.5-25 mg total) by mouth every 4 (four) hours as needed for refractory nausea / vomiting. Floreen Comber, NP unk  Active Mother  senna (SENOKOT) 8.6 MG tablet [id number] No Take 1-2 tablets by mouth at bedtime as needed for constipation. [provider] unk Active             Home Care and Equipment/Supplies: Were Home Health Services Ordered?: No Any new equipment or medical supplies ordered?: No  Functional Questionnaire: Do you need assistance with bathing/showering or dressing?: No Do you need assistance with meal preparation?: No Do you need assistance with eating?: No Do you need assistance with getting out of bed/getting out of a chair/moving?: No Do you have difficulty managing or taking your medications?: No  Follow up appointments reviewed: PCP Follow-up appointment confirmed?: Yes Date of PCP follow-up appointment?: 03/09/23 Follow-up Provider: Dina Rich Follow-up appointment confirmed?: NA Do you need transportation to your follow-up appointment?: No Do you understand care options if your condition(s) worsen?: Yes-patient verbalized understanding    SIGNATURE    Remi Haggard LPN                           [facility] Team

## 2023-03-07 ENCOUNTER — Other Ambulatory Visit: Payer: Self-pay | Admitting: Family Medicine

## 2023-03-07 ENCOUNTER — Telehealth: Payer: Self-pay | Admitting: Family Medicine

## 2023-03-07 DIAGNOSIS — I10 Essential (primary) hypertension: Secondary | ICD-10-CM

## 2023-03-07 DIAGNOSIS — E876 Hypokalemia: Secondary | ICD-10-CM

## 2023-03-07 MED ORDER — APIXABAN 5 MG PO TABS: 5.0000 mg | ORAL_TABLET | Freq: Two times a day (BID) | ORAL | 1 refills | Status: DC

## 2023-03-07 MED ORDER — POTASSIUM CHLORIDE CRYS ER 20 MEQ PO TBCR
20.0000 meq | EXTENDED_RELEASE_TABLET | Freq: Every day | ORAL | 1 refills | Status: DC
Start: 2023-03-07 — End: 2023-03-08

## 2023-03-07 NOTE — Transitions of Care (Post Inpatient/ED Visit) (Signed)
   03/07/2023  Name: Sheri Beck MRN: [id number] DOB: 1966/08/13  Created in error    Woodfin Ganja LPN [facility] Nurse Health Advisor Direct Dial [phone]

## 2023-03-07 NOTE — Addendum Note (Signed)
Addended by: Johnella Moloney on: 03/07/2023 04:39 PM   Modules accepted: Orders

## 2023-03-07 NOTE — Telephone Encounter (Signed)
Says fax was sent by [facility] and they are awaiting the signed letter to be returned that was faxed over. They can be reached at [phone]  Prescription Request  03/07/2023  LOV: 01/04/2023  What is the name of the medication or equipment? escitalopram (LEXAPRO) 20 MG tablet  potassium chloride SA (KLOR-CON M) 20 MEQ tablet    Cholecalciferol (VITAMIN D3) 1.25 MG (50000 UT) CAPS  Cholecalciferol (VITAMIN D3) 1.25 MG (50000 UT) CAPS   promethazine (PHENERGAN) 12.5 MG tablet montelukast (SINGULAIR) 10 MG tablet  gabapentin (NEURONTIN) 300 MG capsu amLODipine (NORVASC) 10 MG tablet  apixaban (ELIQUIS) 5 MG TABS tablet    Have you contacted your pharmacy to request a refill? No   Which pharmacy would you like this sent to?  [id number] - [place], [place] - [address] AT [facility] OF [address] [place] [address] Phone: [phone] Fax: [phone]    Patient notified that their request is being sent to the clinical staff for review and that they should receive a response within 2 business days.   Please advise at Mobile [phone]

## 2023-03-07 NOTE — Telephone Encounter (Signed)
Multiple faxes from [facility] was placed in the folder for PCP to sign.  Rx refills for Potassium and Eliquis were sent to [facility].  Message forwarded to Dr Casimiro Needle  for refills on other medications as not previously prescribed by PCP.

## 2023-03-08 ENCOUNTER — Other Ambulatory Visit: Payer: Self-pay | Admitting: Family Medicine

## 2023-03-08 MED ORDER — PROMETHAZINE HCL 12.5 MG PO TABS: 12.5000 mg | ORAL_TABLET | Freq: Three times a day (TID) | ORAL | 0 refills | Status: DC | PRN

## 2023-03-08 MED ORDER — VITAMIN D3 1.25 MG (50000 UT) PO CAPS: 50000.0000 [IU] | ORAL_CAPSULE | ORAL | 0 refills | Status: AC

## 2023-03-08 MED ORDER — GABAPENTIN 300 MG PO CAPS: 300.0000 mg | ORAL_CAPSULE | Freq: Three times a day (TID) | ORAL | 0 refills | Status: DC

## 2023-03-08 MED ORDER — AMLODIPINE BESYLATE 10 MG PO TABS
5.0000 mg | ORAL_TABLET | Freq: Every day | ORAL | 0 refills | Status: DC
Start: 2023-03-08 — End: 2023-08-29

## 2023-03-08 MED ORDER — ESCITALOPRAM OXALATE 20 MG PO TABS: 20.0000 mg | ORAL_TABLET | Freq: Every day | ORAL | 0 refills | Status: DC

## 2023-03-08 MED ORDER — MONTELUKAST SODIUM 10 MG PO TABS: 10.0000 mg | ORAL_TABLET | Freq: Every day | ORAL | 0 refills | Status: DC

## 2023-03-08 NOTE — Telephone Encounter (Signed)
Pt mom is calling checking on [facility] form and also the other medications need  go to [facility]

## 2023-03-08 NOTE — Telephone Encounter (Signed)
Rx done. 

## 2023-03-08 NOTE — Telephone Encounter (Signed)
Ok to refill her medications, I am filling out the forms now

## 2023-03-08 NOTE — Telephone Encounter (Signed)
Spoke with the patient's mother and informed her PCP is seeing patient's and will sign the forms to be sent to [facility] in between patients.  Also informed her to allow 48-72 hours for refills and the patient will be contacted if there is a problem-otherwise await a call from the pharmacy when the Rxs are ready for pick up.

## 2023-03-08 NOTE — Addendum Note (Signed)
Addended by: Johnella Moloney on: 03/08/2023 02:49 PM   Modules accepted: Orders

## 2023-03-09 ENCOUNTER — Ambulatory Visit: Payer: BLUE CROSS/BLUE SHIELD | Admitting: Family Medicine

## 2023-03-12 ENCOUNTER — Emergency Department (HOSPITAL_COMMUNITY)
Admission: EM | Admit: 2023-03-12 | Discharge: 2023-03-12 | Disposition: A | Discharge status: Home / Self Care | Payer: BLUE CROSS/BLUE SHIELD | Attending: Emergency Medicine | Admitting: Emergency Medicine

## 2023-03-12 ENCOUNTER — Other Ambulatory Visit: Payer: Self-pay

## 2023-03-12 ENCOUNTER — Encounter (HOSPITAL_COMMUNITY): Payer: Self-pay

## 2023-03-12 DIAGNOSIS — Z7901 Long term (current) use of anticoagulants: Secondary | ICD-10-CM | POA: Diagnosis not present

## 2023-03-12 DIAGNOSIS — M79605 Pain in left leg: Secondary | ICD-10-CM | POA: Insufficient documentation

## 2023-03-12 DIAGNOSIS — R519 Headache, unspecified: Secondary | ICD-10-CM | POA: Insufficient documentation

## 2023-03-12 DIAGNOSIS — R531 Weakness: Secondary | ICD-10-CM | POA: Diagnosis present

## 2023-03-12 MED ORDER — GABAPENTIN 300 MG PO CAPS
300.0000 mg | ORAL_CAPSULE | ORAL | Status: AC
  Administered 2023-03-12: 300 mg via ORAL
  Filled 2023-03-12: qty 1

## 2023-03-12 MED ORDER — APIXABAN 5 MG PO TABS
5.0000 mg | ORAL_TABLET | ORAL | Status: AC
  Administered 2023-03-12: 5 mg via ORAL
  Filled 2023-03-12: qty 1

## 2023-03-12 NOTE — Discharge Instructions (Signed)
Take your medications as directed and follow-up with your doctor as needed

## 2023-03-12 NOTE — ED Provider Notes (Signed)
[facility] AT [facility] Provider Note   CSN: [id number] Arrival date & time: 03/12/23  1954     History  Chief Complaint  Patient presents with   Leg Pain    Chronic left leg pain. Pt denies new injury or trauma known at this time.    Headache    Pt reports she has been home alone since approx. 0800 this AM that her family who provides her care for her has been gone all day, and pt has not had any care or food to eat since prior to family leaving for graduation parties all day. Pt reports home health caregivers did not show up as scheduled. Pt heavily soiled in urine at this time. 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 Pt reports "I have a headache like this when I'm stressed out!"     DELILA DOSTER is a 57 y.o. female.  57 year old female who presents requesting a dose of her gabapentin Eliquis.  I saw patient for similar complaints recently.  Patient has home health but states that he did not come today.  Notes that they handed her medication to her.  Notes that her biggest concerns are receiving her Eliquis and gabapentin.  States that she has someone will be home with her this evening who can give her the remaining medications.  She has no other somatic complaints.       Home Medications Prior to Admission medications   Medication Sig Start Date End Date Taking? Authorizing Provider  amLODipine (NORVASC) 10 MG tablet Take 0.5 tablets (5 mg total) by mouth daily. 03/08/23   Karie Georges, MD  apixaban (ELIQUIS) 5 MG TABS tablet Take 1 tablet (5 mg total) by mouth 2 (two) times daily. 03/07/23   Karie Georges, MD  busPIRone (BUSPAR) 7.5 MG tablet Take 7.5 mg by mouth 2 (two) times daily.    [provider]  Cholecalciferol (VITAMIN D3) 1.25 MG (50000 UT) CAPS Take 1 capsule (50,000 Units total) by mouth every 7 (seven) days. 03/08/23   Karie Georges, MD  diclofenac Sodium (VOLTAREN)  1 % GEL Apply 2 g topically 4 (four) times daily as needed (apply to areas of pain and inflammation.). 02/02/23   Karie Georges, MD  docusate sodium (COLACE) 100 MG capsule Take 100 mg by mouth 2 (two) times daily.    [provider]  escitalopram (LEXAPRO) 20 MG tablet Take 1 tablet (20 mg total) by mouth daily. 03/08/23   Karie Georges, MD  gabapentin (NEURONTIN) 300 MG capsule Take 1 capsule (300 mg total) by mouth 3 (three) times daily. 03/08/23   Karie Georges, MD  hydrOXYzine (ATARAX) 25 MG tablet Take 1 tablet (25 mg total) by mouth at bedtime as needed for anxiety. 11/09/22   Tilden Fossa, MD  lisinopril-hydrochlorothiazide (ZESTORETIC) 20-12.5 MG tablet Take 1 tablet by mouth once daily 10/05/22   Karie Georges, MD  methocarbamol (ROBAXIN) 500 MG tablet Take 250 mg by mouth every 8 (eight) hours as needed for muscle spasms.    [provider]  montelukast (SINGULAIR) 10 MG tablet Take 1 tablet (10 mg total) by mouth at bedtime. 03/08/23   Karie Georges, MD  neomycin-polymyxin-hydrocortisone (CORTISPORIN) OTIC solution Place 4 drops into the left ear 4 (four) times daily. 01/31/23   Nelwyn Salisbury, MD  ondansetron (ZOFRAN) 4 MG tablet Take 1 tablet (4 mg total) by mouth  every 4 (four) hours as needed for nausea or vomiting. 10/22/22   Val Eagle D, NP  polyethylene glycol (MIRALAX / GLYCOLAX) 17 g packet Take 17 g by mouth daily as needed for moderate constipation. 10/22/22   Val Eagle D, NP  potassium chloride SA (KLOR-CON M) 20 MEQ tablet TAKE 1 TABLET(20 MEQ) BY MOUTH DAILY 03/08/23   Karie Georges, MD  promethazine (PHENERGAN) 12.5 MG tablet Take 1 tablet (12.5 mg total) by mouth every 8 (eight) hours as needed for nausea or vomiting. 03/08/23   Karie Georges, MD  senna (SENOKOT) 8.6 MG tablet Take 1-2 tablets by mouth at bedtime as needed for constipation.    [provider]      Allergies    Patient has no known allergies.     Review of Systems   Review of Systems  All other systems reviewed and are negative.   Physical Exam Updated Vital Signs BP (!) 109/97 (BP Location: Right Arm)   Pulse 76   Temp (!) 97.5 F (36.4 C) (Oral)   Resp 16   Ht 1.575 m (5\' 2" )   SpO2 100%   BMI 33.67 kg/m  Physical Exam Vitals and nursing note reviewed.  Constitutional:      General: She is not in acute distress.    Appearance: Normal appearance. She is well-developed. She is not toxic-appearing.  HENT:     Head: Normocephalic and atraumatic.  Eyes:     General: Lids are normal.     Conjunctiva/sclera: Conjunctivae normal.     Pupils: Pupils are equal, round, and reactive to light.  Neck:     Thyroid: No thyroid mass.     Trachea: No tracheal deviation.  Cardiovascular:     Rate and Rhythm: Normal rate and regular rhythm.     Heart sounds: Normal heart sounds. No murmur heard.    No gallop.  Pulmonary:     Effort: Pulmonary effort is normal. No respiratory distress.     Breath sounds: Normal breath sounds. No stridor. No decreased breath sounds, wheezing, rhonchi or rales.  Abdominal:     General: There is no distension.     Palpations: Abdomen is soft.     Tenderness: There is no abdominal tenderness. There is no rebound.  Musculoskeletal:        General: No tenderness. Normal range of motion.     Cervical back: Normal range of motion and neck supple.  Skin:    General: Skin is warm and dry.     Findings: No abrasion or rash.  Neurological:     Mental Status: She is alert and oriented to person, place, and time. Mental status is at baseline.     GCS: GCS eye subscore is 4. GCS verbal subscore is 5. GCS motor subscore is 6.     Cranial Nerves: No cranial nerve deficit.     Sensory: No sensory deficit.     Motor: Motor function is intact.  Psychiatric:        Attention and Perception: Attention normal.        Speech: Speech normal.        Behavior: Behavior normal.     ED Results / Procedures /  Treatments   Labs (all labs ordered are listed, but only abnormal results are displayed) Labs Reviewed - No data to display  EKG None  Radiology No results found.  Procedures Procedures    Medications Ordered in ED Medications  apixaban (ELIQUIS) tablet  5 mg (has no administration in time range)  gabapentin (NEURONTIN) capsule 300 mg (has no administration in time range)    ED Course/ Medical Decision Making/ A&P                             Medical Decision Making Risk Prescription drug management.   Will be given dose of Eliquis and gabapentin here.  Patient has adequate home health services.  She has someone will be with her this evening who can give her the rest of her medications at home.  Will discharge        Final Clinical Impression(s) / ED Diagnoses Final diagnoses:  None    Rx / DC Orders ED Discharge Orders     None         Lorre Nick, MD 03/12/23 2102

## 2023-03-12 NOTE — ED Notes (Signed)
Pt cleaned and linens brief and pads also changed at this time, pt had medium soft formed bowel movement as well as urinated. EMS present in hallway to transport pt home. Pt contacted s/o and denies nurse need to call him at this time of dc.

## 2023-03-12 NOTE — ED Triage Notes (Signed)
Pt reports she has been home alone all day, reports that she lives with [name] her friend, and her mother also supervises patient care at home. Pt reports family has been out all day at graduation parties and that the family hired home health to come in for the day but they never showed up. Pt reports she has been laying in urine and been unattended since prior to 0800 this morning when her family left to go to graduation parties. Pt requesting food and drinks at  this time.

## 2023-03-12 NOTE — ED Notes (Signed)
Pt friend [name] whom she reports living with notified via telephone at this time of pt pending DC to home. [name] requests a call back from this nurse once the ambulance arrives to take pt home so he can be at the residence. Pt updated on plan.

## 2023-03-12 NOTE — ED Notes (Signed)
Pt friend [name] called via [phone], and informed per pt request that she is in the ER at this time. Requested to call back and provide update once plan of care determined to d/c to home or keep for admission.

## 2023-03-13 ENCOUNTER — Emergency Department (HOSPITAL_COMMUNITY): Payer: BLUE CROSS/BLUE SHIELD

## 2023-03-13 ENCOUNTER — Emergency Department (HOSPITAL_COMMUNITY)
Admission: EM | Admit: 2023-03-13 | Discharge: 2023-03-13 | Disposition: A | Discharge status: Home / Self Care | Payer: BLUE CROSS/BLUE SHIELD | Attending: Emergency Medicine | Admitting: Emergency Medicine

## 2023-03-13 ENCOUNTER — Other Ambulatory Visit: Payer: Self-pay

## 2023-03-13 DIAGNOSIS — R519 Headache, unspecified: Secondary | ICD-10-CM | POA: Insufficient documentation

## 2023-03-13 DIAGNOSIS — I10 Essential (primary) hypertension: Secondary | ICD-10-CM | POA: Insufficient documentation

## 2023-03-13 DIAGNOSIS — Z7901 Long term (current) use of anticoagulants: Secondary | ICD-10-CM | POA: Insufficient documentation

## 2023-03-13 DIAGNOSIS — Z79899 Other long term (current) drug therapy: Secondary | ICD-10-CM | POA: Insufficient documentation

## 2023-03-13 DIAGNOSIS — G8192 Hemiplegia, unspecified affecting left dominant side: Secondary | ICD-10-CM | POA: Insufficient documentation

## 2023-03-13 LAB — URINALYSIS, ROUTINE W REFLEX MICROSCOPIC
Bilirubin Urine: NEGATIVE
Glucose, UA: NEGATIVE mg/dL
Hgb urine dipstick: NEGATIVE
Ketones, ur: 5 mg/dL — AB
Nitrite: NEGATIVE
Protein, ur: NEGATIVE mg/dL
Specific Gravity, Urine: 1.026 (ref 1.005–1.030)
pH: 5 (ref 5.0–8.0)

## 2023-03-13 LAB — CBC WITH DIFFERENTIAL/PLATELET
Abs Immature Granulocytes: 0.02 10*3/uL (ref 0.00–0.07)
Basophils Absolute: 0.1 10*3/uL (ref 0.0–0.1)
Basophils Relative: 1 %
Eosinophils Absolute: 0.2 10*3/uL (ref 0.0–0.5)
Eosinophils Relative: 2 %
HCT: 41.1 % (ref 36.0–46.0)
Hemoglobin: 13.3 g/dL (ref 12.0–15.0)
Immature Granulocytes: 0 %
Lymphocytes Relative: 40 %
Lymphs Abs: 3.1 10*3/uL (ref 0.7–4.0)
MCH: 28.2 pg (ref 26.0–34.0)
MCHC: 32.4 g/dL (ref 30.0–36.0)
MCV: 87.3 fL (ref 80.0–100.0)
Monocytes Absolute: 0.5 10*3/uL (ref 0.1–1.0)
Monocytes Relative: 6 %
Neutro Abs: 3.9 10*3/uL (ref 1.7–7.7)
Neutrophils Relative %: 51 %
Platelets: 296 10*3/uL (ref 150–400)
RBC: 4.71 MIL/uL (ref 3.87–5.11)
RDW: 14.5 % (ref 11.5–15.5)
WBC: 7.8 10*3/uL (ref 4.0–10.5)
nRBC: 0 % (ref 0.0–0.2)

## 2023-03-13 LAB — BASIC METABOLIC PANEL
Anion gap: 9 (ref 5–15)
BUN: 19 mg/dL (ref 6–20)
CO2: 22 mmol/L (ref 22–32)
Calcium: 9.7 mg/dL (ref 8.9–10.3)
Chloride: 107 mmol/L (ref 98–111)
Creatinine, Ser: 0.66 mg/dL (ref 0.44–1.00)
GFR, Estimated: >60 mL/min (ref >60)
Glucose, Bld: 91 mg/dL (ref 70–99)
Potassium: 4.9 mmol/L (ref 3.5–5.1)
Sodium: 138 mmol/L (ref 135–145)

## 2023-03-13 MED ORDER — METOCLOPRAMIDE HCL 5 MG/ML IJ SOLN
10.0000 mg | Freq: Once | INTRAMUSCULAR | Status: AC
  Administered 2023-03-13: 10 mg via INTRAVENOUS
  Filled 2023-03-13: qty 2

## 2023-03-13 MED ORDER — LORAZEPAM 2 MG/ML IJ SOLN
1.0000 mg | Freq: Once | INTRAMUSCULAR | Status: AC
  Administered 2023-03-13: 1 mg via INTRAVENOUS
  Filled 2023-03-13: qty 1

## 2023-03-13 MED ORDER — DIPHENHYDRAMINE HCL 50 MG/ML IJ SOLN
25.0000 mg | Freq: Once | INTRAMUSCULAR | Status: AC
  Administered 2023-03-13: 25 mg via INTRAVENOUS
  Filled 2023-03-13: qty 1
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
## 2023-03-13 NOTE — ED Notes (Signed)
Social worker called and advised that if pt comes up for discharge to please let the pt's mother know when pt is on the way home via [facility].
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
## 2023-03-13 NOTE — Discharge Instructions (Addendum)
Your MRI did not show any new findings and is stable from previous imaging.  Please continue to take your medications as prescribed.

## 2023-03-13 NOTE — Progress Notes (Signed)
At this time the patient will DC home once medically cleared. The Patients mom has confirmed care for the patient to this CSW has well provider. TOC has informed the patients mom that she will need to follow up with SW about LTC.

## 2023-03-13 NOTE — ED Provider Notes (Signed)
Accepted handoff at shift change from [facility] [place]. Please see prior provider note for more detail.   Briefly: Patient is 57 y.o. presents to ED complaining of right-sided headache that began last week.  Patient was seen yesterday for weakness.  Patient has plans to go to SNF and is trying to get placement in the outpatient setting.    DDX: concern for worsening herniation, migraine, meningitis, etc   Plan: Await results of MRI, if it is stable, patient may be discharged home   Results for orders placed or performed during the hospital encounter of 03/13/23  CBC with Differential  Result Value Ref Range   WBC 7.8 4.0 - 10.5 K/uL   RBC 4.71 3.87 - 5.11 MIL/uL   Hemoglobin 13.3 12.0 - 15.0 g/dL   HCT 81.1 91.4 - 78.2 %   MCV 87.3 80.0 - 100.0 fL   MCH 28.2 26.0 - 34.0 pg   MCHC 32.4 30.0 - 36.0 g/dL   RDW 95.6 21.3 - 08.6 %   Platelets 296 150 - 400 K/uL   nRBC 0.0 0.0 - 0.2 %   Neutrophils Relative % 51 %   Neutro Abs 3.9 1.7 - 7.7 K/uL   Lymphocytes Relative 40 %   Lymphs Abs 3.1 0.7 - 4.0 K/uL   Monocytes Relative 6 %   Monocytes Absolute 0.5 0.1 - 1.0 K/uL   Eosinophils Relative 2 %   Eosinophils Absolute 0.2 0.0 - 0.5 K/uL   Basophils Relative 1 %   Basophils Absolute 0.1 0.0 - 0.1 K/uL   Immature Granulocytes 0 %   Abs Immature Granulocytes 0.02 0.00 - 0.07 K/uL  Basic metabolic panel  Result Value Ref Range   Sodium 138 135 - 145 mmol/L   Potassium 4.9 3.5 - 5.1 mmol/L   Chloride 107 98 - 111 mmol/L   CO2 22 22 - 32 mmol/L   Glucose, Bld 91 70 - 99 mg/dL   BUN 19 6 - 20 mg/dL   Creatinine, Ser 5.78 0.44 - 1.00 mg/dL   Calcium 9.7 8.9 - 46.9 mg/dL   GFR, Estimated >62 >95 mL/min   Anion gap 9 5 - 15  Urinalysis, Routine w reflex microscopic -Urine, Catheterized  Result Value Ref Range   Color, Urine YELLOW YELLOW   APPearance CLOUDY (A) CLEAR   Specific Gravity, Urine 1.026 1.005 - 1.030   pH 5.0 5.0 - 8.0   Glucose, UA NEGATIVE NEGATIVE mg/dL   Hgb  urine dipstick NEGATIVE NEGATIVE   Bilirubin Urine NEGATIVE NEGATIVE   Ketones, ur 5 (A) NEGATIVE mg/dL   Protein, ur NEGATIVE NEGATIVE mg/dL   Nitrite NEGATIVE NEGATIVE   Leukocytes,Ua TRACE (A) NEGATIVE   RBC / HPF 0-5 0 - 5 RBC/hpf   WBC, UA 6-10 0 - 5 WBC/hpf   Bacteria, UA RARE (A) NONE SEEN   Squamous Epithelial / HPF 21-50 0 - 5 /HPF   Mucus PRESENT    MR BRAIN WO CONTRAST  Result Date: 03/13/2023 CLINICAL DATA:  Headache, new onset. EXAM: MRI HEAD WITHOUT CONTRAST TECHNIQUE: Multiplanar, multiecho pulse sequences of the brain and surrounding structures were obtained without intravenous contrast. COMPARISON:  MRI brain 02/22/2023.  Head CT 03/13/2023. FINDINGS: Brain: No acute infarct or hemorrhage. 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 Ex vacuo dilatation of the right lateral ventricle. Background of moderate chronic small-vessel disease. No acute hydrocephalus or extra-axial collection. Vascular: Normal flow voids. Skull  and upper cervical spine: Prior right hemicraniectomy. Sinuses/Orbits: Unremarkable. Other: None. IMPRESSION: 1. No acute intracranial abnormality. 2. 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 Electronically Signed   By: Orvan Falconer M.D.   On: 03/13/2023 19:06   CT Head Wo Contrast  Result Date: 03/13/2023 CLINICAL DATA:  New onset headache. EXAM: CT HEAD WITHOUT CONTRAST TECHNIQUE: Contiguous axial images were obtained from the base of the skull through the vertex without intravenous contrast. RADIATION DOSE REDUCTION: This exam was performed according to the departmental dose-optimization program which includes automated exposure control, adjustment of the mA and/or kV according to patient size and/or use of iterative reconstruction  technique. COMPARISON:  02/21/2023 FINDINGS: Brain: Similar extensive encephalomalacia in the right hemisphere status post craniectomy. Ex vacuo dilatation of the right lateral ventricle is similar to prior. Bulging of the intracranial contents is similar to prior without midline shift. No findings to suggest hydrocephalus. No acute ischemia. Stable appearance lacunar infarct right cerebellum Vascular: No hyperdense vessel or unexpected calcification. Skull: Large right craniectomy defect again noted. Sinuses/Orbits: The visualized paranasal sinuses and mastoid air cells are clear. Visualized portions of the globes and intraorbital fat are unremarkable. Other: None. IMPRESSION: 1. Stable exam. No new or acute intracranial abnormality. 2. Similar extensive encephalomalacia in the right hemisphere with stable appearance of the bulging contour status post craniectomy. Electronically Signed   By: Kennith Center M.D.   On: 03/13/2023 14:16   MR BRAIN WO CONTRAST  Result Date: 02/22/2023 CLINICAL DATA:  Headache and fever EXAM: MRI HEAD WITHOUT CONTRAST TECHNIQUE: Multiplanar, multiecho pulse sequences of the brain and surrounding structures were obtained without intravenous contrast. COMPARISON:  Head CT 02/21/2023 FINDINGS: Brain: Large area of encephalomalacia at the site of prior right MCA territory infarct. There is herniation of much of the brain parenchyma of the right hemisphere through a large craniectomy defect. This is unchanged since the prior CT. Chronic blood products throughout much of the right MCA territory. Punctate areas of diffusion restriction in the right MCA territory may be artifactual due to the presence of aging blood products. No acute hemorrhage. Ex vacuo dilatation of the right lateral ventricle. Bilateral periventricular and deep white matter hyperintense T2-weighted signal. Old right cerebellar infarct. The midline structures are normal. Vascular: Major flow voids are preserved. Skull and  upper cervical spine: Large right-sided craniectomy. Sinuses/Orbits:No paranasal sinus fluid levels or advanced mucosal thickening. No mastoid or middle ear effusion. Normal orbits. IMPRESSION: 1. No acute intracranial abnormality. Punctate foci of diffusion restriction in the right MCA territory are favored to be susceptibility artifacts related to aging blood products. 2. Large area of encephalomalacia at the site of prior right MCA territory infarct. There is herniation of much of the brain parenchyma of the right hemisphere through a large craniectomy defect, unchanged since the prior CT. Electronically Signed   By: Deatra Robinson M.D.   On: 02/22/2023 20:39   DG Chest Port 1 View  Result Date: 02/21/2023 CLINICAL DATA:  57 year old female with fever, headache. 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 EXAM: PORTABLE CHEST 1 VIEW COMPARISON:  Portable chest 11/09/2022 and earlier. FINDINGS: Portable AP semi upright view at 0501 hours. Mildly lower lung volumes. Allowing for portable technique the lungs are clear. No pneumothorax or pleural effusion. Mediastinal contours are stable and within normal limits. Visualized tracheal air column is within normal limits. Negative visible bowel gas. No acute osseous abnormality identified. IMPRESSION:  Negative portable chest. Electronically Signed   By: Odessa Fleming M.D.   On: 02/21/2023 05:58   CT Head Wo Contrast  Result Date: 02/21/2023 CLINICAL DATA:  57 year old female with fever, headache. History of right MCA infarct, craniectomy. EXAM: CT HEAD WITHOUT CONTRAST TECHNIQUE: Contiguous axial images were obtained from the base of the skull through the vertex without intravenous contrast. RADIATION DOSE REDUCTION: This exam was performed according to the departmental dose-optimization program which includes automated exposure control, adjustment of the mA and/or kV according to patient size and/or use of iterative reconstruction technique. COMPARISON:  Head CT  11/09/2022 and earlier. FINDINGS: Brain: Progressive right hemisphere encephalomalacia since February, complete right MCA territory involvement and some watershed area involvement also. Mild progressive ballooning of the meninges from the right craniectomy since that time. Underlying simple cystic encephalomalacia of the brain parenchyma there now. Associated ex vacuo enlargement of the right lateral ventricle. No significant intracranial mass effect. No other ventriculomegaly. No acute intracranial hemorrhage. Stable gray-white differentiation outside of the right hemisphere including small chronic right cerebellar infarct. No new cortically based infarct identified. Vascular: No acute suspicious intracranial vascular hyperdensity. Skull: Right craniectomy redemonstrated. No new osseous abnormality. Sinuses/Orbits: Visualized paranasal sinuses and mastoids are stable and well aerated. Other: Postoperative changes to the right scalp. Orbits appear stable and negative. IMPRESSION: 1. No acute intracranial abnormality. 2. Progressive right hemisphere encephalomalacia since February, with mild additional bulging of the meninges from the right craniectomy since that time. No significant intracranial mass effect. Electronically Signed   By: Odessa Fleming M.D.   On: 02/21/2023 05:57    Physical Exam  BP 110/88   Pulse 88   Temp 98.3 F (36.8 C) (Oral)   Resp 18   SpO2 100%   Physical Exam Vitals and nursing note reviewed.  Constitutional:      General: She is not in acute distress.    Appearance: Normal appearance. She is not ill-appearing or diaphoretic.  Pulmonary:     Effort: Pulmonary effort is normal.  Neurological:     Mental Status: She is alert. Mental status is at baseline.  Psychiatric:        Mood and Affect: Mood normal.        Behavior: Behavior normal.     Procedures  Procedures  ED Course / MDM   Clinical Course as of 03/13/23 1548  Sun Mar 13, 2023  1527 Attempted to call mother  again prior to shift change with no answer.  [CA]    Clinical Course User Index [CA] Mannie Stabile, PA-C   Medical Decision Making Amount and/or Complexity of Data Reviewed Labs: ordered. Radiology: ordered.  Risk Prescription drug management.   Spoke with patient's mother and POA, Birdie Hopes, who confirmed that patient has home health care 6 days per week for 3 hours per day.  Mother states that patient does not require around the clock care.  Patient's mother states she has already applied for long-term Medicaid to have the patient eventually placed in a care facility, however, until then, patient is receiving the care she requires at home.  Patient is being well taken care of at home per mother and boyfriend, [name] who does stay with her often.  Mother states that patient will "call the ambulance and complain of a headache or something whenever she gets upset".    Will obtain MRI results and determine disposition from there.  After speaking with [name], patient's boyfriend, her mother/POA and [place] with SW, I  believe that patient will be appropriate for discharge home if MRI results are stable.    MRI findings are stable and there are no acute abnormalities.  Patient will be discharged home and taken by [facility].  [name] has agreed to be there at the home to receive patient.  Spoke with mother again, who states she has made arrangements for patient to stay with her while placement is being arranged outpatient.    The patient has been appropriately medically screened and/or stabilized in the ED. I have low suspicion for any other emergent medical condition which would require further screening, evaluation or treatment in the ED or require inpatient management. At time of discharge the patient is hemodynamically stable and in no acute distress. I have discussed work-up results and diagnosis with patient and answered all questions. Patient is agreeable with discharge plan. We discussed strict  return precautions for returning to the emergency department and they verbalized understanding.          Melton Alar R, PA-C 03/13/23 1929    Loetta Rough, MD 03/13/23 2216

## 2023-03-13 NOTE — ED Notes (Signed)
Patient's mother [name] notified of patient discharge and that [facility] was arranged to transport her home.

## 2023-03-13 NOTE — Evaluation (Signed)
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 Patient reported living at home with caregiver A for ADLs and transfers to and from wheelchair. Patient limited today with increased pain in LUE and LLE with slight touch.   Guarded therapy potential at this time with patients level of pain and poor awareness of deficits. PT to continue with patient to attempt to further address basic bed mobility and transfers as pt tolerates.      Recommendations for follow up therapy are one component of a multi-disciplinary discharge planning process, led by the attending physician.  Recommendations may be updated based on patient status, additional functional criteria and insurance authorization.  Follow Up Recommendations Can patient physically be transported by private vehicle: No     Assistance Recommended at Discharge Frequent or constant Supervision/Assistance  Patient can return home with the following  Two people to help with walking and/or transfers;Two people to help with bathing/dressing/bathroom;Assistance with cooking/housework;Assist for transportation;Help with stairs or ramp for entrance    Equipment Recommendations None recommended by PT  Recommendations for Other Services       Functional Status Assessment Patient has not had a recent decline in their functional status     Precautions / Restrictions Precautions Precaution Comments: L side weakness with tone/contracture LUE Restrictions Weight Bearing Restrictions: No       Mobility  Bed Mobility               General bed mobility comments: unable to formally assess as patient has increased pain in LLE and LUE with any touch. patient intially agreeable to transition to EOB but then when therapists began cueing and attempting to assist with movement, touch to LLE prompted patient to call out "no" dont" which therapists both stepped back as patient had not moved at this point from safe point on bed. patient reported that she did not want to move L side to get to EOB. patient was educated that L side needed to advance over to sit EOB. patient then reported that she did not want to sit EOB. patient provided with warm covers and positioned in bed as per patient requests.    Transfers                        Ambulation/Gait                  Stairs            Wheelchair Mobility    Modified Rankin (Stroke Patients Only)       Balance                                             Pertinent Vitals/Pain Pain Assessment Pain Assessment: Faces Faces Pain Scale: Hurts worst Pain Location: any touch to LLE Pain Descriptors / Indicators: Other (Comment) (yelling "don't touch it") Pain Intervention(s): Limited activity within patient's tolerance, Monitored during session  Home Living Family/patient expects to be discharged to:: Private residence Living Arrangements: Spouse/significant other Available Help at Discharge: Family;Available 24 hours/day Type of Home: Apartment Home Access: Stairs to enter Entrance Stairs-Rails: Left Entrance Stairs-Number of Steps: 3 Alternate Level Stairs-Number of Steps: ~20 Home Layout: Two level Home Equipment: None Additional Comments: as per note in chart, patient was living at home with significant other who was caring for patient. per admission notes and case manager notes, there is conflict with patients caregiver that lead to hosptial admission. case manager is in touch  with patients mother at this time.    Prior Function Prior Level of Function : Independent/Modified Independent             Mobility Comments: patient reported needing A from caregivers to transfer to wheelchair with some difficulty with pivoting. ADLs Comments: patient reported being able to complete dressing herself (gown and pull up adult absorbent undergarments at bed level)     Hand Dominance   Dominant Hand: Right    Extremity/Trunk Assessment   Upper Extremity Assessment Upper Extremity Assessment: Difficult to assess due to impaired cognition    Lower Extremity Assessment Lower Extremity Assessment: RLE deficits/detail;LLE deficits/detail RLE Deficits / Details: Pt moving R LE WFL LLE Deficits / Details: Pt refuses any assessment of L LE 2* pain       Communication   Communication: No difficulties  Cognition Arousal/Alertness: Awake/alert Behavior During Therapy: Flat affect Overall Cognitive Status: No family/caregiver present to determine baseline cognitive functioning                                 General Comments: patient was agreeable to sit EOB but not agreeable for therapist to touch patient to assist with this task. patient asked one therapist " are you on TV?" during session. patient seemed to have poor insight to deficits during session.        General Comments General comments (skin integrity, edema, etc.): unable to assess hand with patient educated on concerns over skin integrity in L palm with fisted positioning of hand and limited ability to tolerate movement. patient reports " i move my L arm all the time in the bed" but unable to tolerate attempted movement to show therapist at this time. patient reported " i keep a pill bottle in my hand at home". patient unable to tolerate attempt to place washcloth into hand at this time as patient will not allow therapist to touch her hand to assist with task.    Exercises     Assessment/Plan     PT Assessment Patient needs continued PT services  PT Problem List Decreased strength;Decreased range of motion;Decreased activity tolerance;Decreased balance;Decreased mobility;Decreased cognition;Decreased knowledge of use of DME;Decreased safety awareness;Obesity;Pain       PT Treatment Interventions Functional mobility training;Therapeutic activities;Therapeutic exercise;Balance training;Patient/family education;Wheelchair mobility training    PT Goals (Current goals can be found in the Care Plan section)  Acute Rehab PT Goals Patient Stated Goal: No specific goals expressed PT Goal Formulation: Patient unable to participate in goal setting Time For Goal Achievement: 03/27/23 Potential to Achieve Goals: Poor    Frequency Min 1X/week     Co-evaluation PT/OT/SLP Co-Evaluation/Treatment: Yes Reason for Co-Treatment: Complexity of the patient's impairments (multi-system involvement);To address functional/ADL transfers PT goals addressed during session: Mobility/safety with mobility OT goals addressed during session: ADL's and self-care       AM-PAC PT "6 Clicks" Mobility  Outcome Measure Help needed turning from your back to your side while in a flat bed without using bedrails?: Total Help needed moving from lying on your back to sitting on the side of a flat bed without using bedrails?: Total Help needed moving to and from a bed to a chair (including a wheelchair)?: Total Help needed standing up from a chair using your arms (e.g., wheelchair or bedside chair)?: Total Help needed to walk in hospital room?: Total Help needed climbing 3-5 steps with a railing? : Total 6 Click Score: 6    End of Session   Activity Tolerance: Patient limited by pain Patient left: in bed;with call bell/phone within reach Nurse Communication: Mobility status PT Visit Diagnosis: Hemiplegia and hemiparesis;Adult, failure to thrive (R62.7) Hemiplegia - Right/Left: Left    Time: 1610-9604 PT  Time Calculation (min) (ACUTE ONLY): 34 min   Charges:   PT Evaluation $PT Eval Low Complexity: 1 Low          Mauro Kaufmann PT Acute Rehabilitation Services Pager [phone] Office [phone]   Tywan Siever 03/13/2023, 4:58 PM

## 2023-03-13 NOTE — ED Notes (Signed)
Pt discharged home via [facility] at 2250. Mother [name] and caregiver [name] aware.

## 2023-03-13 NOTE — ED Provider Notes (Signed)
[place] EMERGENCY DEPARTMENT AT [facility] Provider Note   CSN: [id number] Arrival date & time: 03/13/23  1201     History  Chief Complaint  Patient presents with  . Seeking Placement  . Headache    Sheri Beck is a 57 y.o. 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  Patient recently admitted to hospital on 5/20 to 5/23 for the same complaint.  Repeat MRI was obtained which showed no acute abnormalities.  Patient also had a fever at that time and blood cultures grew 1/4 bottles staph hominis.  Patient denies any current fever.  Admits to worsening vision in her right eye.  No worsening weakness.  Patient also concerned about a sacral sore. Notes she has been turned enough recently and her low back has begun to itch because she has been sitting in her urine.  Patient notes she has plans to go to a SNF upon discharge here. It appears patient is trying to get placement in the outpatient setting.   History obtained from patient and past medical records. No interpreter used during encounter.      Home Medications Prior to Admission medications   Medication Sig Start Date End Date Taking? Authorizing Provider  amLODipine (NORVASC) 10 MG tablet Take 0.5 tablets (5 mg total) by mouth daily. 03/08/23   Karie Georges, MD  apixaban (ELIQUIS) 5 MG TABS tablet Take 1 tablet (5 mg total) by mouth 2 (two) times daily. 03/07/23   Karie Georges, MD  busPIRone (BUSPAR) 7.5 MG tablet Take 7.5 mg by mouth 2 (two) times daily.    [provider]  Cholecalciferol (VITAMIN D3) 1.25 MG (50000 UT)  CAPS Take 1 capsule (50,000 Units total) by mouth every 7 (seven) days. 03/08/23   Karie Georges, MD  diclofenac Sodium (VOLTAREN) 1 % GEL Apply 2 g topically 4 (four) times daily as needed (apply to areas of pain and inflammation.). 02/02/23   Karie Georges, MD  docusate sodium (COLACE) 100 MG capsule Take 100 mg by mouth 2 (two) times daily.    [provider]  escitalopram (LEXAPRO) 20 MG tablet Take 1 tablet (20 mg total) by mouth daily. 03/08/23   Karie Georges, MD  gabapentin (NEURONTIN) 300 MG capsule Take 1 capsule (300 mg total) by mouth 3 (three) times daily. 03/08/23   Karie Georges, MD  hydrOXYzine (ATARAX) 25 MG tablet Take 1 tablet (25 mg total) by mouth at bedtime as needed for anxiety. 11/09/22   Tilden Fossa, MD  lisinopril-hydrochlorothiazide (ZESTORETIC) 20-12.5 MG tablet Take 1 tablet by mouth once daily 10/05/22   Karie Georges, MD  methocarbamol (ROBAXIN) 500 MG tablet Take 250 mg by mouth every 8 (eight) hours as needed for muscle spasms.    [provider]  montelukast (SINGULAIR) 10 MG tablet Take 1 tablet (10 mg total) by mouth at bedtime. 03/08/23   Karie Georges, MD  neomycin-polymyxin-hydrocortisone (CORTISPORIN) OTIC solution Place 4 drops into the left ear 4 (four) times daily. 01/31/23   Nelwyn Salisbury, MD  ondansetron (ZOFRAN) 4 MG tablet Take 1 tablet (4 mg total) by mouth every 4 (four) hours as needed  for nausea or vomiting. 10/22/22   Val Eagle D, NP  polyethylene glycol (MIRALAX / GLYCOLAX) 17 g packet Take 17 g by mouth daily as needed for moderate constipation. 10/22/22   Val Eagle D, NP  potassium chloride SA (KLOR-CON M) 20 MEQ tablet TAKE 1 TABLET(20 MEQ) BY MOUTH DAILY 03/08/23   Karie Georges, MD  promethazine (PHENERGAN) 12.5 MG tablet Take 1 tablet (12.5 mg total) by mouth every 8 (eight) hours as needed for nausea or vomiting. 03/08/23   Karie Georges, MD  senna (SENOKOT) 8.6 MG tablet Take 1-2 tablets  by mouth at bedtime as needed for constipation.    [provider]      Allergies    Patient has no known allergies.    Review of Systems   Review of Systems  Eyes:  Positive for visual disturbance.  Neurological:  Positive for headaches.    Physical Exam Updated Vital Signs BP 110/88   Pulse 88   Temp 98.3 F (36.8 C) (Oral)   Resp 18   SpO2 100%  Physical Exam Vitals and nursing note reviewed.  Constitutional:      General: She is not in acute distress.    Appearance: She is not ill-appearing.  HENT:     Head: Normocephalic.  Eyes:     Pupils: Pupils are equal, round, and reactive to light.  Cardiovascular:     Rate and Rhythm: Normal rate and regular rhythm.     Pulses: Normal pulses.     Heart sounds: Normal heart sounds. No murmur heard.    No friction rub. No gallop.  Pulmonary:     Effort: Pulmonary effort is normal.     Breath sounds: Normal breath sounds.  Abdominal:     General: Abdomen is flat. There is no distension.     Palpations: Abdomen is soft.     Tenderness: There is no abdominal tenderness. There is no guarding or rebound.  Genitourinary:    Comments: No sacral wound Musculoskeletal:        General: Normal range of motion.     Cervical back: Neck supple.  Skin:    General: Skin is warm and dry.  Neurological:     General: No focal deficit present.     Mental Status: She is alert.     Comments: Left sided hemiparalysis   Psychiatric:        Mood and Affect: Mood normal.        Behavior: Behavior normal.     ED Results / Procedures / Treatments   Labs (all labs ordered are listed, but only abnormal results are displayed) Labs Reviewed  URINALYSIS, ROUTINE W REFLEX MICROSCOPIC - Abnormal; Notable for the following components:      Result Value   APPearance CLOUDY (*)    Ketones, ur 5 (*)    Leukocytes,Ua TRACE (*)    Bacteria, UA RARE (*)    All other components within normal limits  CBC WITH DIFFERENTIAL/PLATELET  BASIC  METABOLIC PANEL    EKG None  Radiology CT Head Wo Contrast  Result Date: 03/13/2023 CLINICAL DATA:  New onset headache. EXAM: CT HEAD WITHOUT CONTRAST TECHNIQUE: Contiguous axial images were obtained from the base of the skull through the vertex without intravenous contrast. RADIATION DOSE REDUCTION: This exam was performed according to the departmental dose-optimization program which includes automated exposure control, adjustment of the mA and/or kV according to patient size and/or use of iterative reconstruction technique. COMPARISON:  02/21/2023  FINDINGS: Brain: Similar extensive encephalomalacia in the right hemisphere status post craniectomy. Ex vacuo dilatation of the right lateral ventricle is similar to prior. Bulging of the intracranial contents is similar to prior without midline shift. No findings to suggest hydrocephalus. No acute ischemia. Stable appearance lacunar infarct right cerebellum Vascular: No hyperdense vessel or unexpected calcification. Skull: Large right craniectomy defect again noted. Sinuses/Orbits: The visualized paranasal sinuses and mastoid air cells are clear. Visualized portions of the globes and intraorbital fat are unremarkable. Other: None. IMPRESSION: 1. Stable exam. No new or acute intracranial abnormality. 2. Similar extensive encephalomalacia in the right hemisphere with stable appearance of the bulging contour status post craniectomy. Electronically Signed   By: Kennith Center M.D.   On: 03/13/2023 14:16    Procedures Procedures    Medications Ordered in ED Medications  metoCLOPramide (REGLAN) injection 10 mg (10 mg Intravenous Given 03/13/23 1314)  diphenhydrAMINE (BENADRYL) injection 25 mg (25 mg Intravenous Given 03/13/23 1313)    ED Course/ Medical Decision Making/ A&P Clinical Course as of 03/13/23 1528  Sun Mar 13, 2023  1527 Attempted to call mother again prior to shift change with no answer.  [CA]    Clinical Course User Index [CA] Mannie Stabile, PA-C                             Medical Decision Making Amount and/or Complexity of Data Reviewed External Data Reviewed: notes.    Details: Recent admission notes Labs: ordered. Decision-making details documented in ED Course. Radiology: ordered and independent interpretation performed. Decision-making details documented in ED Course.  Risk Prescription drug management.   This patient presents to the ED for concern of headache, this involves an extensive number of treatment options, and is a complaint that carries with it a high risk of complications and morbidity.  The differential diagnosis includes worsening herniation, migraine, meningitis, etc  57 year old female with history of CVA with left-sided hemiparesis presents to the ED due to right-sided headache associated with visual changes.  Patient recently admitted for the same where an MRI was obtained which was stable. Chronic herniation.  Patient also having difficulties getting taking care of at home.  Patient no longer has a caretaker.  Patient is bedbound.  Unable to be at home due to difficulties with ADLs.  Upon arrival patient afebrile, not tachycardic or hypoxic.  Patient in no acute distress.  No meningismus to suggest meningitis.  Left-sided hemiparesis.  Unable to obtain visual acuity per RN from both eyes because patient was unable to see top line.  Patient also concerned about a sacral wound.  Patient rolled with help of RN with no sacral wound.  Some mild erythema.  No signs of cellulitis.  Routine labs ordered.  UA to rule out acute cystitis.  CT head ordered to rule out worsening intracranial findings.  CT I personally reviewed and interpreted which demonstrates stable appearance.  MRI brain ordered given new visual changes.  CBC unremarkable.  No leukocytosis.  Normal hemoglobin.  BMP unremarkable.  Normal renal function.  No major electrolyte derangements.  UA significant for rare bacteria and trace  leukocytes.  Appears contaminated with 21-50 squamous cells.   2:44 PM discussed with [place] with social work who will discuss with patient's mother. PT/OT evaluation placed  3:08 PM Discussed with [place] who spoke to patient's mother who notes patient is safe to go home. Patient lives with [name]. Will discuss with [name]  to verify he is able to take care of patient; however per triage note, [name] is unable to take care of patient. If not, patient cannot be safely discharged home.  3:11 PM Spoke to [name] (patient's boyfriend) who notes he is moving out of the house and cannot take care of patient. Darlin Priestly made aware. If MRI is normal, patient will board in ED until placement is found. Patient is not safe to go home. She is bedbound and lives alone.   3:16 PM attempted to call patient's mother with no answer.  Patient handed off to [facility], PA-C at shift change pending MRI brain. If stable, patient will need to board in ED until care at home has been figured out.  Has PCP Hx CVA       Final Clinical Impression(s) / ED Diagnoses Final diagnoses:  Acute nonintractable headache, unspecified headache type    Rx / DC Orders ED Discharge Orders     None         Mannie Stabile, PA-C 03/13/23 1517    Sloan Leiter, DO 03/20/23 0818
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
## 2023-03-13 NOTE — ED Triage Notes (Signed)
Per PTAR, pt has been living at home with the help of her friend Sheri Beck that is her caretaker, he expressed to [facility] and [employer] that he is no longer going to be her caretaker and would not be at the house upon pt return. [employer] was at the pts house earlier today due to same. Pt POA is currently mother Sheri Beck, number in pt demographics- had requested placement for pt.

## 2023-03-13 NOTE — ED Notes (Signed)
Pt was given a sandwich, chicken soup and gingerale.

## 2023-03-17 ENCOUNTER — Ambulatory Visit: Payer: BLUE CROSS/BLUE SHIELD | Admitting: Family Medicine

## 2023-03-22 ENCOUNTER — Telehealth: Payer: Self-pay | Admitting: Family Medicine

## 2023-03-22 NOTE — Telephone Encounter (Signed)
Patient's mother requesting someone to come out and manage her healthcare from home because they cannot transport her to the office.

## 2023-03-22 NOTE — Telephone Encounter (Signed)
Do you know any providers that perform home visits?

## 2023-03-29 ENCOUNTER — Telehealth: Payer: Self-pay | Admitting: Family Medicine

## 2023-03-29 DIAGNOSIS — G4701 Insomnia due to medical condition: Secondary | ICD-10-CM

## 2023-03-29 MED ORDER — HYDROXYZINE HCL 25 MG PO TABS
25.0000 mg | ORAL_TABLET | Freq: Every evening | ORAL | 2 refills | Status: DC | PRN
Start: 2023-03-29 — End: 2023-05-13

## 2023-03-29 NOTE — Telephone Encounter (Signed)
Spoke with the patient and she stated she is not aware of what her mother was referring to.  I asked that she have her mother call back and leave a message with detailed information as to the company name,etc.

## 2023-03-29 NOTE — Telephone Encounter (Signed)
PCP is aware the E-fax system is down today.  Message sent to PCP for refill request on Hydroxyzine as Eliquis was previously sent.

## 2023-03-29 NOTE — Addendum Note (Signed)
Addended by: Karie Georges on: 03/29/2023 04:40 PM   Modules accepted: Orders

## 2023-03-29 NOTE — Telephone Encounter (Signed)
I did not see any paperwork from them- can you ask [facility] to re-fax the paperwork? I never received the paperwork

## 2023-03-29 NOTE — Telephone Encounter (Signed)
Paperwork was received today via fax and sent to provider. Patient's mother asks that any correspondence be through her. Mother is taking care of patient's medication and DME and patient doesn't participate in these matters. Mother requests a call to discuss [phone]). Also requesting a refill of apixaban (ELIQUIS) 5 MG TABS tablet and hydrOXYzine (ATARAX) 25 MG tablet    [id number] - [place], [place] - [address] AT [facility] OF [address] Phone: [phone]  Fax: [phone]

## 2023-03-29 NOTE — Telephone Encounter (Signed)
Eliquis was sent on 03/07/23, I sent the hydroxyzine
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
## 2023-03-30 NOTE — Telephone Encounter (Signed)
E-base fax received and placed in the red folder.

## 2023-03-30 NOTE — Telephone Encounter (Signed)
Spoke with the patient's mother and informed her PCP completed the forms from [employer] for incontinence supplies.  Form was faxed to [phone] and sent to be scanned.  She was also reminded the patient needs to come in to the office to sign a designated party release in order for [place] to continue providing information to her and per PCP she is overdue for a visit.  She stated the patient "has gotten so big",  is not easy to transport and needs something for sex.  She asked if the paramedics would bring her to the appt and I told her I am not aware of paramedics doing this.  She stated she will be coming in tomorrow to sign a DPR.

## 2023-03-31 NOTE — Telephone Encounter (Signed)
Out of concern for the patient's lack of transportation, I recalled a teams message from the practice administrator being sent to assist patients in this case.  I called the patient's mother and advised she could contact the [facility] Dept and ask for a Child psychotherapist for assistance.  Ms Sheri Beck stated she figured this out, has a Nurse, adult and will have an assistant help the patient to our office.   Appt was scheduled with PCP on 7/3.

## 2023-04-06 ENCOUNTER — Telehealth: Payer: Self-pay | Admitting: *Deleted

## 2023-04-06 ENCOUNTER — Ambulatory Visit: Payer: BLUE CROSS/BLUE SHIELD | Admitting: Family Medicine

## 2023-04-06 NOTE — Telephone Encounter (Signed)
Officer [place] with [employer] called back stating he went to the home, a caretaker was there giving the patient a bath and stated they were told the appt today was cancelled.  Message sent to PCP.

## 2023-04-06 NOTE — Telephone Encounter (Signed)
Spoke with the patient's mother and informed her of the message below.  
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
## 2023-04-06 NOTE — Telephone Encounter (Signed)
Noted. She might do better with a provider who offers home visits, the mother might be able to go online to find out who can offer her daughter home visits.

## 2023-04-19 ENCOUNTER — Telehealth: Payer: Self-pay | Admitting: Family Medicine

## 2023-04-19 ENCOUNTER — Ambulatory Visit: Payer: BLUE CROSS/BLUE SHIELD | Admitting: Family Medicine

## 2023-04-19 DIAGNOSIS — I6930 Unspecified sequelae of cerebral infarction: Secondary | ICD-10-CM

## 2023-04-19 NOTE — Telephone Encounter (Signed)
Sure I can do this, or would the social worker be able to help with placement?

## 2023-04-19 NOTE — Telephone Encounter (Signed)
Pt's mother [name] H.)  called to ask when will MD give them the referral for a nursing home to place Pt. ?  I could not locate a referral under Referrals tab.  Mother states she needs to place her daughter, as soon as possible.   Please advise.

## 2023-04-19 NOTE — Telephone Encounter (Signed)
Message sent to PCP as per teams message from [place] Child psychotherapist, PCP can enter a Child psychotherapist referral and this will go to the assigned Child psychotherapist for our office.

## 2023-04-19 NOTE — Telephone Encounter (Signed)
Referral to social work placed.

## 2023-04-19 NOTE — Telephone Encounter (Signed)
Spoke with the patient's mother, informed her the referral was placed as below for assistance and a Child psychotherapist will contact her with further information.

## 2023-04-25 ENCOUNTER — Encounter (HOSPITAL_COMMUNITY): Payer: Self-pay | Admitting: Emergency Medicine

## 2023-04-25 ENCOUNTER — Emergency Department (HOSPITAL_COMMUNITY): Payer: BLUE CROSS/BLUE SHIELD

## 2023-04-25 ENCOUNTER — Other Ambulatory Visit: Payer: Self-pay

## 2023-04-25 ENCOUNTER — Emergency Department (HOSPITAL_COMMUNITY)
Admission: EM | Admit: 2023-04-25 | Discharge: 2023-04-25 | Disposition: A | Discharge status: Home / Self Care | Payer: BLUE CROSS/BLUE SHIELD | Attending: Emergency Medicine | Admitting: Emergency Medicine

## 2023-04-25 DIAGNOSIS — R519 Headache, unspecified: Secondary | ICD-10-CM | POA: Diagnosis not present

## 2023-04-25 DIAGNOSIS — Z7901 Long term (current) use of anticoagulants: Secondary | ICD-10-CM | POA: Diagnosis not present

## 2023-04-25 DIAGNOSIS — R350 Frequency of micturition: Secondary | ICD-10-CM | POA: Insufficient documentation

## 2023-04-25 DIAGNOSIS — I10 Essential (primary) hypertension: Secondary | ICD-10-CM | POA: Diagnosis not present

## 2023-04-25 DIAGNOSIS — Z79899 Other long term (current) drug therapy: Secondary | ICD-10-CM | POA: Diagnosis not present

## 2023-04-25 LAB — CBC
HCT: 49.1 % — ABNORMAL HIGH (ref 36.0–46.0)
Hemoglobin: 15.6 g/dL — ABNORMAL HIGH (ref 12.0–15.0)
MCH: 28.1 pg (ref 26.0–34.0)
MCHC: 31.8 g/dL (ref 30.0–36.0)
MCV: 88.5 fL (ref 80.0–100.0)
Platelets: 263 10*3/uL (ref 150–400)
RBC: 5.55 MIL/uL — ABNORMAL HIGH (ref 3.87–5.11)
RDW: 13.9 % (ref 11.5–15.5)
WBC: 6.1 10*3/uL (ref 4.0–10.5)
nRBC: 0 % (ref 0.0–0.2)

## 2023-04-25 LAB — HEPATIC FUNCTION PANEL
ALT: 44 U/L (ref 0–44)
AST: 32 U/L (ref 15–41)
Albumin: 3.8 g/dL (ref 3.5–5.0)
Alkaline Phosphatase: 72 U/L (ref 38–126)
Bilirubin, Direct: 0.3 mg/dL — ABNORMAL HIGH (ref 0.0–0.2)
Indirect Bilirubin: 0.1 mg/dL — ABNORMAL LOW (ref 0.3–0.9)
Total Bilirubin: 0.4 mg/dL (ref 0.3–1.2)
Total Protein: 6.5 g/dL (ref 6.5–8.1)

## 2023-04-25 LAB — BASIC METABOLIC PANEL
Anion gap: 11 (ref 5–15)
BUN: 26 mg/dL — ABNORMAL HIGH (ref 6–20)
CO2: 22 mmol/L (ref 22–32)
Calcium: 10.2 mg/dL (ref 8.9–10.3)
Chloride: 103 mmol/L (ref 98–111)
Creatinine, Ser: 0.79 mg/dL (ref 0.44–1.00)
GFR, Estimated: >60 mL/min (ref >60)
Glucose, Bld: 86 mg/dL (ref 70–99)
Potassium: 4.3 mmol/L (ref 3.5–5.1)
Sodium: 136 mmol/L (ref 135–145)

## 2023-04-25 LAB — URINALYSIS, ROUTINE W REFLEX MICROSCOPIC
Bilirubin Urine: NEGATIVE
Glucose, UA: NEGATIVE mg/dL
Hgb urine dipstick: NEGATIVE
Ketones, ur: NEGATIVE mg/dL
Leukocytes,Ua: NEGATIVE
Nitrite: NEGATIVE
Protein, ur: NEGATIVE mg/dL
Specific Gravity, Urine: 1.024 (ref 1.005–1.030)
pH: 5 (ref 5.0–8.0)

## 2023-04-25 LAB — I-STAT CG4 LACTIC ACID, ED: Lactic Acid, Venous: 0.8 mmol/L (ref 0.5–1.9)

## 2023-04-25 LAB — LIPASE, BLOOD: Lipase: 48 U/L (ref 11–51)

## 2023-04-25 MED ORDER — SODIUM CHLORIDE 0.9 % IV BOLUS
500.0000 mL | Freq: Once | INTRAVENOUS | Status: AC
  Administered 2023-04-25: 500 mL via INTRAVENOUS

## 2023-04-25 MED ORDER — ACETAMINOPHEN 500 MG PO TABS
1000.0000 mg | ORAL_TABLET | Freq: Once | ORAL | Status: AC
  Administered 2023-04-25: 1000 mg via ORAL
  Filled 2023-04-25: qty 2

## 2023-04-25 NOTE — ED Notes (Signed)
Awaiting patient from lobby 

## 2023-04-25 NOTE — ED Triage Notes (Signed)
Pt bib EMS for  complaints of headache x 3 days and painful, malodorous urine. Pt is paralyzed on left side at baseline.

## 2023-04-25 NOTE — ED Provider Triage Note (Signed)
Emergency Medicine Provider Triage Evaluation Note  Sheri Beck , a 57 y.o. female  was evaluated in triage.  Pt complains of HA and dysuria. No fever. Denies CP. Has history of right MCA infarct requiring decompression and CVA resulting in left hemiparesis. She returns to the ED with right sided HA and dysuria. No fever. No falls or head injury.  Review of Systems  Positive: Right sided HA and dysuria Negative: Fever, AMS, CP, SOB  Physical Exam  BP (!) 86/57   Pulse 86   Temp 98.8 F (37.1 C) (Oral)   Resp 18   Ht 5\' 2"  (1.575 m)   Wt 83 kg   SpO2 96%   BMI 33.47 kg/m  Gen:   Awake, no distress   Resp:  Normal effort  MSK:   Baseline left hemiparesis. Other:  Focal swelling to the right temporal scalp without evidence of cellulitis.   Medical Decision Making  Medically screening exam initiated at 12:49 PM.  Appropriate orders placed.  Sheri Beck was informed that the remainder of the evaluation will be completed by another provider, this initial triage assessment does not replace that evaluation, and the importance of remaining in the ED until their evaluation is complete.     Maia Plan, MD 04/25/23 1252

## 2023-04-25 NOTE — Discharge Instructions (Signed)
You were seen in the emergency department today for urinary frequency. You do not have a UTI. Additionally, you had headache but the CT of your head looks stable. We provided you with IVF and tylenol to help with your blood pressure and headache. Please return to the emergency department for worsening symptoms.

## 2023-04-25 NOTE — ED Notes (Signed)
Ptar called 

## 2023-04-25 NOTE — ED Provider Notes (Signed)
I provided a substantive portion of the care of this patient.  I personally made/approved the management plan for this patient and take responsibility for the patient management.     Patient presents as multiple complaints.  She identifies combination of urinary frequency as well as headache for a week or more.  Nonspecific location, generalized migratory.  No headache currently.  History of left-sided stroke deficits but patient feels is worse than baseline.  Patient is alert.  Chronically ill in appearance.  Nontoxic.  No respiratory distress.  Speaking in full sentences without difficulty.  Heart regular.  Breath sounds symmetric.  Abdomen soft nontender nondistended.  Chronic pre-existing left-sided paralysis.  I have reviewed diagnostic results and past medical history.  Reviewed with PA-C [place].  I agree with plan of management.   Arby Barrette, MD 05/01/23 [phone]

## 2023-04-25 NOTE — ED Notes (Signed)
Phlebotomy to get I-stat lactic

## 2023-04-25 NOTE — ED Provider Notes (Signed)
[place] EMERGENCY DEPARTMENT AT [facility] Provider Note   CSN: [id number] Arrival date & time: 04/25/23  1234     History  Chief Complaint  Patient presents with   Urinary Frequency   Headache   Hypotension    Sheri Beck is a 57 y.o. female. With past medical history of hypertension, hyperlipidemia, stroke with left sided paralysis who presents to the emergency department with urinary frequency.   States she has had urinary frequency for 1 week. She describes having increased frequency and pressure when urinating. She does endorse mild dysuria. Denies malodorous urine. She denies having fever, nausea, vomiting, diarrhea or abdominal pain. Additionally, she states she has had headache for about 1 week. Describes having worsening vision for about 2 weeks in her left eye. She feels like she is having pressure on the right side of her head that is non-radiating. She has history of stroke with left-sided deficits but feels weaker than baseline.     Urinary Frequency Associated symptoms include headaches.  Headache Associated symptoms: weakness        Home Medications Prior to Admission medications   Medication Sig Start Date End Date Taking? Authorizing Provider  amLODipine (NORVASC) 10 MG tablet Take 0.5 tablets (5 mg total) by mouth daily. 03/08/23   Karie Georges, MD  apixaban (ELIQUIS) 5 MG TABS tablet Take 1 tablet (5 mg total) by mouth 2 (two) times daily. 03/07/23   Karie Georges, MD  busPIRone (BUSPAR) 7.5 MG tablet Take 7.5 mg by mouth 2 (two) times daily.    [provider]  Cholecalciferol (VITAMIN D3) 1.25 MG (50000 UT) CAPS Take 1 capsule (50,000 Units total) by mouth every 7 (seven) days. 03/08/23   Karie Georges, MD  diclofenac Sodium (VOLTAREN) 1 % GEL Apply 2 g topically 4 (four) times daily as needed (apply to areas of pain and inflammation.). 02/02/23   Karie Georges, MD  docusate sodium (COLACE) 100 MG capsule Take 100 mg by  mouth 2 (two) times daily.    [provider]  escitalopram (LEXAPRO) 20 MG tablet Take 1 tablet (20 mg total) by mouth daily. 03/08/23   Karie Georges, MD  gabapentin (NEURONTIN) 300 MG capsule Take 1 capsule (300 mg total) by mouth 3 (three) times daily. 03/08/23   Karie Georges, MD  hydrOXYzine (ATARAX) 25 MG tablet Take 1 tablet (25 mg total) by mouth at bedtime as needed for anxiety. 03/29/23   Karie Georges, MD  lisinopril-hydrochlorothiazide (ZESTORETIC) 20-12.5 MG tablet Take 1 tablet by mouth once daily 10/05/22   Karie Georges, MD  methocarbamol (ROBAXIN) 500 MG tablet Take 250 mg by mouth every 8 (eight) hours as needed for muscle spasms.    [provider]  montelukast (SINGULAIR) 10 MG tablet Take 1 tablet (10 mg total) by mouth at bedtime. 03/08/23   Karie Georges, MD  neomycin-polymyxin-hydrocortisone (CORTISPORIN) OTIC solution Place 4 drops into the left ear 4 (four) times daily. 01/31/23   Nelwyn Salisbury, MD  ondansetron (ZOFRAN) 4 MG tablet Take 1 tablet (4 mg total) by mouth every 4 (four) hours as needed for nausea or vomiting. 10/22/22   Val Eagle D, NP  polyethylene glycol (MIRALAX / GLYCOLAX) 17 g packet Take 17 g by mouth daily as needed for moderate constipation. 10/22/22   Val Eagle D, NP  potassium chloride SA (KLOR-CON M) 20 MEQ tablet TAKE 1 TABLET(20 MEQ) BY MOUTH DAILY 03/08/23   Casimiro Needle,  Vinetta Bergamo, MD  promethazine (PHENERGAN) 12.5 MG tablet Take 1 tablet (12.5 mg total) by mouth every 8 (eight) hours as needed for nausea or vomiting. 03/08/23   Karie Georges, MD  senna (SENOKOT) 8.6 MG tablet Take 1-2 tablets by mouth at bedtime as needed for constipation.    [provider]      Allergies    Patient has no known allergies.    Review of Systems   Review of Systems  Genitourinary:  Positive for dysuria and frequency.  Neurological:  Positive for weakness and headaches.  All other systems reviewed and are  negative.   Physical Exam Updated Vital Signs BP 105/81 (BP Location: Right Arm)   Pulse 78   Temp 98.3 F (36.8 C) (Oral)   Resp 16   Ht 5\' 2"  (1.575 m)   Wt 83 kg   SpO2 100%   BMI 33.47 kg/m  Physical Exam Vitals and nursing note reviewed.  Constitutional:      General: She is not in acute distress.    Appearance: Normal appearance. She is ill-appearing. She is not toxic-appearing.     Comments: Chronically ill appearing  HENT:     Head: Normocephalic.  Eyes:     General: No scleral icterus.    Extraocular Movements: Extraocular movements intact.     Pupils: Pupils are equal, round, and reactive to light.  Cardiovascular:     Rate and Rhythm: Normal rate and regular rhythm.     Heart sounds: Normal heart sounds. No murmur heard. Pulmonary:     Effort: Pulmonary effort is normal. No respiratory distress.     Breath sounds: Normal breath sounds.  Abdominal:     General: Bowel sounds are normal. There is no distension.     Palpations: Abdomen is soft.  Skin:    General: Skin is warm and dry.     Capillary Refill: Capillary refill takes less than 2 seconds.  Neurological:     General: No focal deficit present.     Mental Status: She is alert. Mental status is at baseline.     Cranial Nerves: Facial asymmetry present.     Motor: Weakness present.     Comments: Left sided facial droop, left sided paralysis - baseline  Psychiatric:        Mood and Affect: Mood normal.        Speech: Speech normal.        Behavior: Behavior normal.     ED Results / Procedures / Treatments   Labs (all labs ordered are listed, but only abnormal results are displayed) Labs Reviewed  URINE CULTURE - Abnormal; Notable for the following components:      Result Value   Culture MULTIPLE SPECIES PRESENT, SUGGEST RECOLLECTION (*)    All other components within normal limits  URINALYSIS, ROUTINE W REFLEX MICROSCOPIC - Abnormal; Notable for the following components:   APPearance HAZY (*)     All other components within normal limits  CBC - Abnormal; Notable for the following components:   RBC 5.55 (*)    Hemoglobin 15.6 (*)    HCT 49.1 (*)    All other components within normal limits  BASIC METABOLIC PANEL - Abnormal; Notable for the following components:   BUN 26 (*)    All other components within normal limits  HEPATIC FUNCTION PANEL - Abnormal; Notable for the following components:   Bilirubin, Direct 0.3 (*)    Indirect Bilirubin 0.1 (*)    All  other components within normal limits  LIPASE, BLOOD  I-STAT CG4 LACTIC ACID, ED  I-STAT CG4 LACTIC ACID, ED    EKG EKG Interpretation Date/Time:  Monday April 25 2023 12:44:57 EDT Ventricular Rate:  76 PR Interval:  164 QRS Duration:  82 QT Interval:  394 QTC Calculation: 443 R Axis:   5  Text Interpretation: Normal sinus rhythm Normal ECG When compared with ECG of 21-Feb-2023 04:08, PREVIOUS ECG IS PRESENT Confirmed by Drema Pry [phone]) on 04/26/2023 12:11:17 PM  Radiology CT Head Wo Contrast  Result Date: 04/25/2023 CLINICAL DATA:  Headache, sudden, severe EXAM: CT HEAD WITHOUT CONTRAST TECHNIQUE: Contiguous axial images were obtained from the base of the skull through the vertex without intravenous contrast. RADIATION DOSE REDUCTION: This exam was performed according to the departmental dose-optimization program which includes automated exposure control, adjustment of the mA and/or kV according to patient size and/or use of iterative reconstruction technique. COMPARISON:  CTA March 13, 2023. FINDINGS: Brain: Similar extensive right cerebral encephalomalacia which bulges outwards along the craniectomy. No evidence of acute hemorrhage mass lesion or new midline shift. No hydrocephalus. Right sided ex vacuo ventricular dilation, similar. Vascular: No hyperdense vessel. Skull: Right craniectomy. Sinuses/Orbits: Clear sinuses.  No acute orbital findings. Other: No mastoid effusions. IMPRESSION: No evidence of acute  intracranial abnormality. Electronically Signed   By: Feliberto Harts M.D.   On: 04/25/2023 15:32    Procedures Procedures   Medications Ordered in ED Medications  sodium chloride 0.9 % bolus 500 mL (0 mLs Intravenous Stopped 04/25/23 1447)  sodium chloride 0.9 % bolus 500 mL (0 mLs Intravenous Stopped 04/25/23 1646)  acetaminophen (TYLENOL) tablet 1,000 mg (1,000 mg Oral Given 04/25/23 1539)    ED Course/ Medical Decision Making/ A&P   {    Medical Decision Making Amount and/or Complexity of Data Reviewed Labs: ordered.  Risk OTC drugs.  Initial Impression and Ddx 57 year old female who presents to the emergency department with urinary frequency, headache Patient PMH that increases complexity of ED encounter:  hypertension, hyperlipidemia, stroke with left sided paralysis  Interpretation of Diagnostics I independent reviewed and interpreted the labs as followed: Labs without leukocytosis.  CBC without electrolyte derangement, AKI.  UA negative, culture sent.  Lipase is negative.  Lactic is negative.  - I independently visualized the following imaging with scope of interpretation limited to determining acute life threatening conditions related to emergency care: CT head, which revealed no acute findings  Patient Reassessment and Ultimate Disposition/Management 57 year old female here with urinary frequency and dysuria as well as headache and feeling of weakness. She is chronically ill appearing but nonseptic in appearance. She does present with hypotension to 80s systolic, afebrile. Will get labs, urine, lactic. Will also get CT head. Giving IVF from triage. Will give another for a total of 1L IVF and reassess BP.   On reassessment, patient stable.  Her blood pressure did improve after a liter of IV fluids.  Given Tylenol for headache.  She feels improved.  Labs are unremarkable here.  There is no evidence of UTI.  A urine culture was sent.  Additionally no hematuria and  no flank pain so doubt stone.  Unclear etiology of her urinary frequency and mild dysuria symptoms.  May be early cystitis but no evidence of this now so will not give antibiotics.  She has no history of neurogenic bladder and do not feel that she is having any sort of retention or incontinence.  Additionally, complained of headache.  She  does have history of stroke.  Obtain a CT head which was negative for acute findings.  No new deficits on exam.  She does have residual left-sided paralysis and facial droop.  This is unchanged from previous.  No new symptoms like dizziness, changes to vision, right-sided symptoms.  She was evaluated by Dr. Donnald Garre prior to discharge to felt follow-up PCP is most appropriate at this time.  Will give her return precautions.  The patient has been appropriately medically screened and/or stabilized in the ED. I have low suspicion for any other emergent medical condition which would require further screening, evaluation or treatment in the ED or require inpatient management. At time of discharge the patient is hemodynamically stable and in no acute distress. I have discussed work-up results and diagnosis with patient and answered all questions. Patient is agreeable with discharge plan. We discussed strict return precautions for returning to the emergency department and they verbalized understanding.      Patient management required discussion with the following services or consulting groups:  None  Complexity of Problems Addressed Acute complicated illness or Injury  Additional Data Reviewed and Analyzed Further history obtained from: Past medical history and medications listed in the EMR, Prior ED visit notes, and Care Everywhere  Patient Encounter Risk Assessment SDOH impact on management  Final Clinical Impression(s) / ED Diagnoses Final diagnoses:  Urinary frequency  Nonintractable headache, unspecified chronicity pattern, unspecified headache type    Rx  / DC Orders ED Discharge Orders     None         Cristopher Peru, PA-C 04/26/23 2316    Arby Barrette, MD 05/01/23 [phone]

## 2023-04-26 LAB — URINE CULTURE

## 2023-05-02 ENCOUNTER — Telehealth: Payer: Self-pay | Admitting: Family Medicine

## 2023-05-02 DIAGNOSIS — E876 Hypokalemia: Secondary | ICD-10-CM

## 2023-05-02 DIAGNOSIS — G4701 Insomnia due to medical condition: Secondary | ICD-10-CM

## 2023-05-02 NOTE — Telephone Encounter (Signed)
Requesting FL-2 form be filled out for patient to be admitted to [facility] [address], [place], [place] [address]  [phone]

## 2023-05-03 ENCOUNTER — Other Ambulatory Visit: Payer: Self-pay | Admitting: Family Medicine

## 2023-05-03 NOTE — Telephone Encounter (Signed)
Medication list was also attached and sent via fax.

## 2023-05-03 NOTE — Telephone Encounter (Signed)
Per PCP I spoke with [name], the patient's mother for additional information regarding if the patient is incontinent of bowel, are any pressure ulcers present, the type of inappropriate behavior and if there are any limitations of sight or hearing?  Information was added to the FL-2 and faxed to [facility] attn: admissions at [phone].  Original sent to be scanned.

## 2023-05-04 NOTE — Telephone Encounter (Signed)
Pt mom is calling and would like copy of FL-2  to pick up tomorrow

## 2023-05-09 ENCOUNTER — Other Ambulatory Visit: Payer: Self-pay | Admitting: Family Medicine

## 2023-05-11 ENCOUNTER — Telehealth: Payer: Self-pay | Admitting: Family Medicine

## 2023-05-11 NOTE — Telephone Encounter (Signed)
Pt is requesting a letter for [employer] stating she cannot work.

## 2023-05-12 ENCOUNTER — Encounter: Payer: Self-pay | Admitting: *Deleted

## 2023-05-12 NOTE — Telephone Encounter (Signed)
Ok to send letter-- patient has a history of large CVA and has left residual hemiparesis, is unable to hold any sort of employment permanently.

## 2023-05-12 NOTE — Telephone Encounter (Signed)
Spoke with the patient, informed her PCP completed the letter and this was left at the front desk for pick up.

## 2023-05-12 NOTE — Telephone Encounter (Signed)
FL2 ready for pick up and the mom is aware. They are also asking for refills of: Eliquis, Hydroxyzine, and Potassium sent to [facility] at [facility].  Okay to send?

## 2023-05-12 NOTE — Telephone Encounter (Signed)
Ok to print a copy -- I had filled it out previously

## 2023-05-13 ENCOUNTER — Telehealth: Payer: Self-pay | Admitting: Family Medicine

## 2023-05-13 MED ORDER — POTASSIUM CHLORIDE CRYS ER 20 MEQ PO TBCR
20.0000 meq | EXTENDED_RELEASE_TABLET | Freq: Every day | ORAL | 0 refills | Status: DC
Start: 2023-05-13 — End: 2023-11-10

## 2023-05-13 MED ORDER — HYDROXYZINE HCL 25 MG PO TABS
25.0000 mg | ORAL_TABLET | Freq: Every evening | ORAL | 2 refills | Status: DC | PRN
Start: 2023-05-13 — End: 2023-09-13

## 2023-05-13 NOTE — Telephone Encounter (Signed)
Letter re-printed and faxed to the number below.

## 2023-05-13 NOTE — Telephone Encounter (Signed)
Pt call back and stated she want you to fax the letter to attn: Tenna Child the fax # is [phone].

## 2023-05-13 NOTE — Addendum Note (Signed)
Addended by: Sallee Lange A on: 05/13/2023 02:00 PM   Modules accepted: Orders

## 2023-05-13 NOTE — Telephone Encounter (Signed)
Rx done. 

## 2023-05-22 ENCOUNTER — Encounter (HOSPITAL_COMMUNITY): Payer: Self-pay

## 2023-05-22 ENCOUNTER — Emergency Department (HOSPITAL_COMMUNITY): Payer: BLUE CROSS/BLUE SHIELD

## 2023-05-22 ENCOUNTER — Other Ambulatory Visit: Payer: Self-pay

## 2023-05-22 ENCOUNTER — Emergency Department (HOSPITAL_COMMUNITY)
Admission: EM | Admit: 2023-05-22 | Discharge: 2023-05-22 | Disposition: A | Discharge status: Home / Self Care | Payer: BLUE CROSS/BLUE SHIELD | Source: Home / Self Care | Attending: Emergency Medicine | Admitting: Emergency Medicine

## 2023-05-22 DIAGNOSIS — Z8673 Personal history of transient ischemic attack (TIA), and cerebral infarction without residual deficits: Secondary | ICD-10-CM | POA: Diagnosis not present

## 2023-05-22 DIAGNOSIS — R519 Headache, unspecified: Secondary | ICD-10-CM

## 2023-05-22 DIAGNOSIS — R531 Weakness: Secondary | ICD-10-CM | POA: Diagnosis not present

## 2023-05-22 DIAGNOSIS — Z7901 Long term (current) use of anticoagulants: Secondary | ICD-10-CM | POA: Diagnosis not present

## 2023-05-22 DIAGNOSIS — R350 Frequency of micturition: Secondary | ICD-10-CM | POA: Diagnosis not present

## 2023-05-22 MED ORDER — SODIUM CHLORIDE 0.9 % IV BOLUS
1000.0000 mL | Freq: Once | INTRAVENOUS | Status: AC
  Administered 2023-05-22: 1000 mL via INTRAVENOUS

## 2023-05-22 MED ORDER — DROPERIDOL 2.5 MG/ML IJ SOLN
2.5000 mg | Freq: Once | INTRAMUSCULAR | Status: AC
  Administered 2023-05-22: 2.5 mg via INTRAVENOUS
  Filled 2023-05-22: qty 2

## 2023-05-22 NOTE — ED Provider Notes (Signed)
[place] EMERGENCY DEPARTMENT AT [facility] Provider Note   CSN: [id number] Arrival date & time: 05/22/23  1346     History  Chief Complaint  Patient presents with   Headache    NOEMI DELAY is a 57 y.o. female.  57 year old female here today for headache and increased urinary frequency.  This been ongoing for several months for the patient, however the headache became worse today.  She also had increased urinary frequency over the last few days.  I reviewed the patient's most recent ED visits.  I reviewed the patient's medications and confirmed them with her.   Headache      Home Medications Prior to Admission medications   Medication Sig Start Date End Date Taking? Authorizing Provider  nystatin cream (MYCOSTATIN) Apply 2 Applications topically 2 (two) times daily. 05/16/23  Yes [provider]  amLODipine (NORVASC) 10 MG tablet Take 0.5 tablets (5 mg total) by mouth daily. 03/08/23   Karie Georges, MD  busPIRone (BUSPAR) 7.5 MG tablet Take 7.5 mg by mouth 2 (two) times daily.    [provider]  Cholecalciferol (VITAMIN D3) 1.25 MG (50000 UT) CAPS Take 1 capsule (50,000 Units total) by mouth every 7 (seven) days. 03/08/23   Karie Georges, MD  diclofenac Sodium (VOLTAREN) 1 % GEL Apply 2 g topically 4 (four) times daily as needed (apply to areas of pain and inflammation.). 02/02/23   Karie Georges, MD  docusate sodium (COLACE) 100 MG capsule Take 100 mg by mouth 2 (two) times daily.    [provider]  ELIQUIS 5 MG TABS tablet TAKE 1 TABLET(5 MG) BY MOUTH TWICE DAILY 05/09/23   Karie Georges, MD  escitalopram (LEXAPRO) 20 MG tablet Take 1 tablet (20 mg total) by mouth daily. 03/08/23   Karie Georges, MD  gabapentin (NEURONTIN) 300 MG capsule Take 1 capsule (300 mg total) by mouth 3 (three) times daily. 03/08/23   Karie Georges, MD  hydrOXYzine (ATARAX) 25 MG tablet Take 1 tablet (25 mg total) by mouth at bedtime as  needed for anxiety. 05/13/23   Karie Georges, MD  lisinopril-hydrochlorothiazide (ZESTORETIC) 20-12.5 MG tablet Take 1 tablet by mouth once daily 10/05/22   Karie Georges, MD  montelukast (SINGULAIR) 10 MG tablet Take 1 tablet (10 mg total) by mouth at bedtime. 03/08/23   Karie Georges, MD  potassium chloride SA (KLOR-CON M) 20 MEQ tablet Take 1 tablet (20 mEq total) by mouth daily. TAKE 1 TABLET(20 MEQ) BY MOUTH DAILY 05/13/23   Karie Georges, MD  promethazine (PHENERGAN) 12.5 MG tablet Take 1 tablet (12.5 mg total) by mouth every 8 (eight) hours as needed for nausea or vomiting. 03/08/23   Karie Georges, MD  senna (SENOKOT) 8.6 MG tablet Take 1-2 tablets by mouth at bedtime as needed for constipation.    [provider]      Allergies    Patient has no known allergies.    Review of Systems   Review of Systems  Neurological:  Positive for headaches.    Physical Exam Updated Vital Signs BP (!) 120/96   Pulse 73   Temp 98.4 F (36.9 C)   Resp 17   SpO2 100%  Physical Exam Neurological:     Mental Status: She is alert and oriented to person, place, and time.     Motor: Weakness present.     Comments: Normal speech, residual left-sided weakness from prior CVA  Psychiatric:        Mood and Affect: Mood normal.     ED Results / Procedures / Treatments   Labs (all labs ordered are listed, but only abnormal results are displayed) Labs Reviewed - No data to display   EKG None  Radiology CT Head Wo Contrast  Result Date: 05/22/2023 CLINICAL DATA:  Sudden headache, recent stroke EXAM: CT HEAD WITHOUT CONTRAST TECHNIQUE: Contiguous axial images were obtained from the base of the skull through the vertex without intravenous contrast. RADIATION DOSE REDUCTION: This exam was performed according to the departmental dose-optimization program which includes automated exposure control, adjustment of the mA and/or kV according to patient size and/or use of iterative  reconstruction technique. COMPARISON:  04/25/2023 FINDINGS: Brain: Unchanged, extensive right MCA territory encephalomalacia. No evidence of acute infarction, hemorrhage, hydrocephalus, extra-axial collection or mass lesion/mass effect. Vascular: No hyperdense vessel or unexpected calcification. Skull: Status post right hemicraniectomy. Negative for fracture or focal lesion. Sinuses/Orbits: No acute finding. Other: None. IMPRESSION: 1. No acute intracranial pathology. 2. Unchanged, extensive right MCA territory encephalomalacia. 3. Status post right hemicraniectomy. Electronically Signed   By: Jearld Lesch M.D.   On: 05/22/2023 16:49    Procedures Procedures    Medications Ordered in ED Medications  droperidol (INAPSINE) 2.5 MG/ML injection 2.5 mg (2.5 mg Intravenous Given 05/22/23 1446)  sodium chloride 0.9 % bolus 1,000 mL (0 mLs Intravenous Stopped 05/22/23 1620)    ED Course/ Medical Decision Making/ A&P                                 Medical Decision Making 57 year old female here today for headache.  Plan-patient with chronic headaches, multiple visits to the emergency department for headache.  Will symptomatically treat.    Patient's prior history of CVA, will obtain CT imaging of the head.  Vital signs reassuring.  No new focal neurological deficits.  Reassessment-my dependent review the patient's head CT does not show any intracranial hemorrhage.  CT imaging does show fairly impressive encephalomalacia.  Headache has resolved.  Will discharge home.  Amount and/or Complexity of Data Reviewed Radiology: ordered.  Risk Prescription drug management.           Final Clinical Impression(s) / ED Diagnoses Final diagnoses:  Headache disorder    Rx / DC Orders ED Discharge Orders     None         Arletha Pili, DO 05/22/23 1840

## 2023-05-22 NOTE — ED Notes (Signed)
[facility] notified for transport 

## 2023-05-22 NOTE — Discharge Instructions (Signed)
Please follow-up with your primary care doctor this week to discuss chronic management of your headaches.

## 2023-05-22 NOTE — ED Triage Notes (Signed)
BIBA from home for headache. CVA in 10/2022, left side paralysis. Pt has been off of Eliquis and beta blockers- waiting for new MD visit to start back. 98/72 BP 74 HR 18 RR 98% room air

## 2023-06-02 ENCOUNTER — Telehealth: Payer: Self-pay | Admitting: Family Medicine

## 2023-06-02 DIAGNOSIS — M549 Dorsalgia, unspecified: Secondary | ICD-10-CM

## 2023-06-02 NOTE — Telephone Encounter (Signed)
Spoke with the patient's mother and informed her PCP is out of the office and will review the message when she returns on 9/3.

## 2023-06-02 NOTE — Telephone Encounter (Signed)
Requesting a special mattress (air or gel) for patient thru [employer]. Patient is experiencing back and leg pain from laying down as much as she has to due to her conditon. Also asking if her  gabapentin (NEURONTIN) 300 MG capsule bumped up as she is experiencing more pain.

## 2023-06-07 NOTE — Addendum Note (Signed)
Addended by: Johnella Moloney on: 06/07/2023 01:52 PM   Modules accepted: Orders

## 2023-06-07 NOTE — Telephone Encounter (Signed)
Spoke with the patient's mother and informed her of the community message below from [employer] and advised she contact the patient's insurance:  Sheri Beck  Sheri Beck, CMA; [employer]; [place], [place] Patients primary insurance is [place] with [place].

## 2023-06-07 NOTE — Telephone Encounter (Signed)
Ok to send order for the gel mattress, is she taking the gabapentin three times a day? If so then she should be out of them and needs a refill. Please confirm with the mother and find out how often she is taking the medication.

## 2023-06-07 NOTE — Telephone Encounter (Signed)
DME order placed and community message sent to [employer] for assistance.  Message sent to PCP as patient's mother stated she is taking Gabapentin TID, requested a refill on Lisinopril-hydrochlorothiazide both be sent to [facility]

## 2023-06-08 MED ORDER — GABAPENTIN 400 MG PO CAPS
400.0000 mg | ORAL_CAPSULE | Freq: Three times a day (TID) | ORAL | 2 refills | Status: DC
Start: 2023-06-08 — End: 2023-11-01

## 2023-06-08 NOTE — Addendum Note (Signed)
Addended by: Karie Georges on: 06/08/2023 08:52 AM   Modules accepted: Orders

## 2023-06-22 ENCOUNTER — Telehealth: Payer: Self-pay | Admitting: Family Medicine

## 2023-06-22 NOTE — Telephone Encounter (Signed)
PCP completed the form, this was faxed to [facility] at [phone] and sent to be scanned.

## 2023-06-22 NOTE — Telephone Encounter (Signed)
Sheri Beck with [facility] will fax form for incontinence supplies

## 2023-06-24 NOTE — Telephone Encounter (Signed)
Johnny from  info missing from question 19 patient height and weight needs to be added   [phone] fax  [phone]

## 2023-06-24 NOTE — Telephone Encounter (Signed)
Sheri Beck from [employer]

## 2023-06-27 NOTE — Telephone Encounter (Signed)
Pt mom is aware form will be refax with the below info

## 2023-06-28 NOTE — Telephone Encounter (Signed)
PCP added info and the form was refaxed to [phone].

## 2023-06-29 ENCOUNTER — Telehealth: Payer: Self-pay | Admitting: Family Medicine

## 2023-06-29 ENCOUNTER — Other Ambulatory Visit: Payer: Self-pay | Admitting: Family Medicine

## 2023-06-29 DIAGNOSIS — E785 Hyperlipidemia, unspecified: Secondary | ICD-10-CM

## 2023-06-29 DIAGNOSIS — I10 Essential (primary) hypertension: Secondary | ICD-10-CM

## 2023-06-29 MED ORDER — LISINOPRIL-HYDROCHLOROTHIAZIDE 20-12.5 MG PO TABS
1.0000 | ORAL_TABLET | Freq: Every day | ORAL | 0 refills | Status: DC
Start: 2023-06-29 — End: 2023-08-05

## 2023-06-29 NOTE — Telephone Encounter (Signed)
Rx done. 

## 2023-06-29 NOTE — Telephone Encounter (Signed)
Prescription Request  06/29/2023  LOV: 01/04/2023  What is the name of the medication or equipment? lisinopril-hydrochlorothiazide (ZESTORETIC) 20-12.5 MG tablet   Have you contacted your pharmacy to request a refill? Yes   Which pharmacy would you like this sent to?  [id number] - [place],  - [address] AT [facility] OF [address] [place] [address] Phone: [phone] Fax: [phone]    Patient notified that their request is being sent to the clinical staff for review and that they should receive a response within 2 business days.   Please advise at Mobile [phone] (mobile)

## 2023-07-30 ENCOUNTER — Other Ambulatory Visit: Payer: Self-pay | Admitting: Family Medicine

## 2023-07-30 DIAGNOSIS — I10 Essential (primary) hypertension: Secondary | ICD-10-CM

## 2023-07-30 DIAGNOSIS — E785 Hyperlipidemia, unspecified: Secondary | ICD-10-CM

## 2023-08-05 ENCOUNTER — Other Ambulatory Visit: Payer: Self-pay | Admitting: Family Medicine

## 2023-08-05 DIAGNOSIS — I10 Essential (primary) hypertension: Secondary | ICD-10-CM

## 2023-08-05 DIAGNOSIS — E785 Hyperlipidemia, unspecified: Secondary | ICD-10-CM

## 2023-08-29 ENCOUNTER — Telehealth: Payer: Self-pay | Admitting: Family Medicine

## 2023-08-29 ENCOUNTER — Other Ambulatory Visit: Payer: Self-pay | Admitting: Family Medicine

## 2023-08-29 DIAGNOSIS — I10 Essential (primary) hypertension: Secondary | ICD-10-CM

## 2023-08-29 MED ORDER — AMLODIPINE BESYLATE 10 MG PO TABS
5.0000 mg | ORAL_TABLET | Freq: Every day | ORAL | 0 refills | Status: DC
Start: 2023-08-29 — End: 2023-09-06

## 2023-08-29 MED ORDER — APIXABAN 5 MG PO TABS: 5.0000 mg | ORAL_TABLET | Freq: Two times a day (BID) | ORAL | 0 refills | Status: DC

## 2023-08-29 NOTE — Telephone Encounter (Signed)
Prescription Request  08/29/2023  LOV: 01/04/2023  What is the name of the medication or equipment?  amLODipine (NORVASC) 10 MG tablet  ELIQUIS 5 MG TABS tablet  Have you contacted your pharmacy to request a refill? No   Which pharmacy would you like this sent to?  [id number] - [place], [place] - [address] AT [facility] OF [address] [place] [address] Phone: [phone] Fax: [phone]    Patient notified that their request is being sent to the clinical staff for review and that they should receive a response within 2 business days.   Please advise at Mobile [phone] (mobile)

## 2023-08-29 NOTE — Telephone Encounter (Signed)
Rx done.

## 2023-09-06 ENCOUNTER — Other Ambulatory Visit: Payer: Self-pay | Admitting: Family Medicine

## 2023-09-06 DIAGNOSIS — I10 Essential (primary) hypertension: Secondary | ICD-10-CM

## 2023-09-09 ENCOUNTER — Other Ambulatory Visit: Payer: Self-pay | Admitting: Family Medicine

## 2023-09-09 DIAGNOSIS — E785 Hyperlipidemia, unspecified: Secondary | ICD-10-CM

## 2023-09-09 DIAGNOSIS — I10 Essential (primary) hypertension: Secondary | ICD-10-CM

## 2023-09-09 NOTE — Telephone Encounter (Signed)
Pt really needs to be seen in the office -- I know she is difficult to bring in because she is mostly confined to a wheelchair/bed. I would recommend to the mother that they try to find a provider who performs home visits if they cannot bring her in

## 2023-09-13 ENCOUNTER — Other Ambulatory Visit: Payer: Self-pay | Admitting: Family Medicine

## 2023-09-13 DIAGNOSIS — G4701 Insomnia due to medical condition: Secondary | ICD-10-CM

## 2023-09-13 NOTE — Telephone Encounter (Signed)
Patient informed of the message below and voiced understanding  

## 2023-09-15 ENCOUNTER — Telehealth: Payer: Self-pay | Admitting: Family Medicine

## 2023-09-15 NOTE — Telephone Encounter (Signed)
Asking if we have a copy of form [id number]. If we have the copy they will come pick it up, pls call with update.

## 2023-09-16 NOTE — Telephone Encounter (Signed)
Left a detailed message on the voicemail for [facility] stating our office does not provide copies of any information from the patient's chart without a signed release from the patient.

## 2023-10-11 ENCOUNTER — Encounter: Payer: BLUE CROSS/BLUE SHIELD | Admitting: Family Medicine

## 2023-11-01 ENCOUNTER — Telehealth: Payer: Self-pay | Admitting: Family Medicine

## 2023-11-01 ENCOUNTER — Ambulatory Visit (INDEPENDENT_AMBULATORY_CARE_PROVIDER_SITE_OTHER): Payer: BLUE CROSS/BLUE SHIELD | Admitting: Family Medicine

## 2023-11-01 ENCOUNTER — Encounter: Payer: Self-pay | Admitting: Family Medicine

## 2023-11-01 VITALS — BP 112/80 | HR 76 | Temp 97.5°F | Ht 62.0 in

## 2023-11-01 DIAGNOSIS — I6930 Unspecified sequelae of cerebral infarction: Secondary | ICD-10-CM | POA: Diagnosis not present

## 2023-11-01 DIAGNOSIS — I10 Essential (primary) hypertension: Secondary | ICD-10-CM | POA: Diagnosis not present

## 2023-11-01 DIAGNOSIS — E782 Mixed hyperlipidemia: Secondary | ICD-10-CM

## 2023-11-01 DIAGNOSIS — G4701 Insomnia due to medical condition: Secondary | ICD-10-CM

## 2023-11-01 DIAGNOSIS — Z23 Encounter for immunization: Secondary | ICD-10-CM

## 2023-11-01 DIAGNOSIS — M549 Dorsalgia, unspecified: Secondary | ICD-10-CM

## 2023-11-01 DIAGNOSIS — B379 Candidiasis, unspecified: Secondary | ICD-10-CM

## 2023-11-01 DIAGNOSIS — F320 Major depressive disorder, single episode, mild: Secondary | ICD-10-CM

## 2023-11-01 DIAGNOSIS — Z1211 Encounter for screening for malignant neoplasm of colon: Secondary | ICD-10-CM

## 2023-11-01 MED ORDER — HYDROXYZINE HCL 25 MG PO TABS
25.0000 mg | ORAL_TABLET | Freq: Three times a day (TID) | ORAL | 2 refills | Status: DC | PRN
Start: 2023-11-01 — End: 2023-12-26

## 2023-11-01 MED ORDER — APIXABAN 5 MG PO TABS
5.0000 mg | ORAL_TABLET | Freq: Two times a day (BID) | ORAL | 1 refills | Status: AC
Start: 2023-11-01 — End: ?

## 2023-11-01 MED ORDER — ESCITALOPRAM OXALATE 10 MG PO TABS
10.0000 mg | ORAL_TABLET | Freq: Every day | ORAL | 1 refills | Status: AC
Start: 2023-11-01 — End: ?

## 2023-11-01 MED ORDER — NYSTATIN 100000 UNIT/GM EX POWD: 1.0000 | Freq: Two times a day (BID) | CUTANEOUS | 5 refills | Status: DC | PRN

## 2023-11-01 MED ORDER — GABAPENTIN 400 MG PO CAPS
400.0000 mg | ORAL_CAPSULE | Freq: Three times a day (TID) | ORAL | 2 refills | Status: DC
Start: 2023-11-01 — End: 2024-01-31

## 2023-11-01 NOTE — Telephone Encounter (Signed)
Copied from CRM [id number]. Topic: General - Other >> Nov 01, 2023 10:36 AM Irine Seal wrote: Reason for CRM: Pts daughter Judyann Munson, needs a FL2 form and progress notes  faxed to [facility]  make attention to [employer] fax # [phone]

## 2023-11-01 NOTE — Progress Notes (Unsigned)
Established Patient Office Visit  Subjective   Patient ID: Sheri Beck, female    DOB: 31-May-1966  Age: 58 y.o. MRN: [id number]  Chief Complaint  Patient presents with   Medical Management of Chronic Issues   Ear Pain    Patient complains of left ear pain x1 week    Pt is reporting a new symptom of ear pain on left, going on for 1 week now. No fever/chills, no sore throat of other upper respiratory symptoms. Describes it as a constant pain, it is in front of her ear towards her TMJ joint. No other associated symptoms no hearing changes. States that it is chronic and ongoing as well (pt is not an accurate historian) her mother and SO are also in the visit today.   HTN -- BP in office performed and is well controlled. She  reports no side effects to the medications, no chest pain, SOB, dizziness or headaches. She has a BP cuff at home and is checking BP regularly, reports they are in the normal range.   Patient is also reporting burning under her breast on the right side. States that it has been going on for about a month. Reports that it is going around her rib cage on the right, mother reports that before that there were several blister like lesions on the skin in the same area, states that they appear to be improving.    Current Outpatient Medications  Medication Instructions   amLODipine (NORVASC) 10 MG tablet TAKE 1/2 TABLET(5 MG) BY MOUTH DAILY. NEED APPOINTMENT FOR REFILLS.   apixaban (ELIQUIS) 5 mg, Oral, 2 times daily   busPIRone (BUSPAR) 7.5 mg, 2 times daily   diclofenac Sodium (VOLTAREN) 2 g, Topical, 4 times daily PRN   docusate sodium (COLACE) 100 mg, 2 times daily   escitalopram (LEXAPRO) 10 mg, Oral, Daily   gabapentin (NEURONTIN) 400 mg, Oral, 3 times daily   hydrOXYzine (ATARAX) 25 mg, Oral, Every 8 hours PRN   lisinopril-hydrochlorothiazide (ZESTORETIC) 20-12.5 MG tablet 1 tablet, Oral, Daily   nystatin (MYCOSTATIN/NYSTOP) powder 1 Application, Topical, 2 times  daily PRN   potassium chloride SA (KLOR-CON M) 20 MEQ tablet 20 mEq, Oral, Daily, TAKE 1 TABLET(20 MEQ) BY MOUTH DAILY   promethazine (PHENERGAN) 12.5 mg, Oral, Every 8 hours PRN   senna (SENOKOT) 8.6 MG tablet 1-2 tablets, At bedtime PRN   Vitamin D3 50,000 Units, Oral, Every 7 days    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   Major depressive disorder, single episode, 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   Adjustment disorder with depressed mood 10/21/2022   Delirium due to multiple etiologies, acute, hypoactive 10/21/2022   Tobacco use 10/21/2022   Cocaine dependence (HCC) 10/21/2022   Episodic cannabis use 10/21/2022   Snoring 10/21/2022   Alcohol use 10/21/2022   Stroke (HCC) 10/16/2022   Stroke (cerebrum) (HCC) 10/16/2022   Hyperlipidemia 09/28/2022   Severe obesity (BMI 35.0-39.9) with comorbidity (HCC) 09/28/2022   Acute bacterial conjunctivitis of right eye 05/06/2020   Dental abscess 05/06/2020   Essential hypertension 05/06/2020      Review of Systems  All other systems reviewed and are negative.     Objective:     BP 112/80   Pulse 76   Temp (!) 97.5 F (36.4 C) (Oral)   Ht 5\' 2"  (1.575 m)   SpO2 98%   BMI 33.47 kg/m    Physical Exam Vitals reviewed.  Constitutional:      Appearance: Normal appearance. She is well-groomed. She is obese.  Eyes:     Conjunctiva/sclera: Conjunctivae normal.  Neck:     Thyroid: No thyromegaly.  Cardiovascular:     Rate and Rhythm: Normal rate and regular rhythm.     Pulses: Normal pulses.     Heart sounds: S1 normal  and S2 normal.  Pulmonary:     Effort: Pulmonary effort is normal.     Breath sounds: Normal breath sounds and air entry.  Abdominal:     General: Bowel sounds are normal.  Skin:    Findings: Lesion (right breast small dime sized area of shallow ulceration) present.  Neurological:     Mental Status: She is alert and oriented to person, place, and time. Mental status is at baseline.     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        Speech: Speech normal.        Behavior: Behavior normal.        Judgment: Judgment normal.    Flowsheet Row Office Visit from 11/01/2023 in [facility] [place]  PHQ-9 Total Score 12        No results found for any visits on 11/01/23.    The ASCVD Risk score (Arnett DK, et al., 2019) failed to calculate for the following reasons:   Risk score cannot be calculated because patient has a medical history suggesting prior/existing ASCVD    Assessment & Plan:  Essential hypertension Assessment & Plan: Current hypertension medications:       Sig   amLODipine (NORVASC) 10 MG tablet (Taking) TAKE 1/2 TABLET(5 MG) BY MOUTH DAILY. NEED APPOINTMENT FOR REFILLS.   lisinopril-hydrochlorothiazide (ZESTORETIC) 20-12.5 MG tablet (Taking) TAKE 1 TABLET BY MOUTH DAILY      Chronic, stable, BP is well controlled on the above medications, will continue as prescribed.    Mixed hyperlipidemia Assessment & Plan: Patient needs new lipid panel done, orders placed.   Orders: -     Lipid panel  History of CVA with residual deficit Assessment & Plan: Needs refills on the eliquis today, no new neuro symptoms, contractures and hemi paresis is stable from previous visits.   Orders: -     Apixaban; Take 1 tablet (5 mg total) by mouth 2 (two) times daily.  Dispense: 180 tablet; Refill: 1  Major depressive disorder, single episode, mild  (HCC) Assessment & Plan: PHQ is 12 today, relatively stable, pt had stopped taking the lexapro medication after the refills ran out, will restart it at 10 mg daily  Orders: -     Escitalopram Oxalate; Take 1 tablet (10 mg total) by mouth daily.  Dispense: 90 tablet; Refill: 1  Insomnia due to medical condition Stable, chronic, refilled her hydroxyzine today.   -     hydrOXYzine HCl; Take 1 tablet (25 mg total) by mouth every 8 (eight) hours as needed.  Dispense: 90 tablet; Refill: 2  Back pain, unspecified back location, unspecified back pain laterality, unspecified chronicity -  Gabapentin; Take 1 capsule (400 mg total) by mouth 3 (three) times daily.  Dispense: 90 capsule; Refill: 2  Colon cancer screening -     Ambulatory referral to Gastroenterology  Yeast infection Under breasts, the skin looks normal today, no signsof yeast on today's exam, however there is excessive moisture there, will give her nystatin powder to use once a week to help prevent any yeast infections.   The skin lesion and burning on the right breast is most likely a singles outbreak given the history that her mother reported. It appears to be healing, I advised she continue to use the gabapentin as needed for her pain.   -     Nystatin; Apply 1 Application topically 2 (two) times daily as needed.  Dispense: 15 g; Refill: 5  Immunization due -     Varicella-zoster vaccine IM     Return in about 6 months (around 04/30/2024).    Karie Georges, MD

## 2023-11-02 NOTE — Assessment & Plan Note (Signed)
Needs refills on the eliquis today, no new neuro symptoms, contractures and hemi paresis is stable from previous visits.

## 2023-11-02 NOTE — Assessment & Plan Note (Signed)
Current hypertension medications:       Sig   amLODipine (NORVASC) 10 MG tablet (Taking) TAKE 1/2 TABLET(5 MG) BY MOUTH DAILY. NEED APPOINTMENT FOR REFILLS.   lisinopril-hydrochlorothiazide (ZESTORETIC) 20-12.5 MG tablet (Taking) TAKE 1 TABLET BY MOUTH DAILY      Chronic, stable, BP is well controlled on the above medications, will continue as prescribed.

## 2023-11-02 NOTE — Assessment & Plan Note (Signed)
PHQ is 12 today, relatively stable, pt had stopped taking the lexapro medication after the refills ran out, will restart it at 10 mg daily

## 2023-11-02 NOTE — Assessment & Plan Note (Signed)
Patient needs new lipid panel done, orders placed.

## 2023-11-07 NOTE — Telephone Encounter (Signed)
I did it on January 6th-- it's already in the media section

## 2023-11-07 NOTE — Telephone Encounter (Signed)
Message forwarded back to PCP as the FL-2 form that was scanned on 10/10/2023 was signed and has date of 01/28/2023.  Generic  FL-2 form printed via google and placed in the red folder.

## 2023-11-07 NOTE — Telephone Encounter (Signed)
Copied from CRM [id number]. Topic: General - Other >> Nov 07, 2023  9:54 AM Corin V wrote: Reason for CRM: Patient called to follow up on the status of her FL2 paperwork. Family is wanting to get her into a facility as quickly as possible. Please call patient's mother [name] back to update her on status at [phone]

## 2023-11-10 ENCOUNTER — Other Ambulatory Visit: Payer: Self-pay | Admitting: Family Medicine

## 2023-11-10 DIAGNOSIS — E876 Hypokalemia: Secondary | ICD-10-CM

## 2023-11-10 NOTE — Telephone Encounter (Signed)
 Completed FL2 form was given to me. Called patient's mother to inform her that the [facility] was completed and signed by Dr. Ozell along with a printed medication list. She thanked me for calling and stated that she will pick up the form today or tomorrow (11/12/23)

## 2023-11-14 ENCOUNTER — Telehealth: Payer: Self-pay | Admitting: Family Medicine

## 2023-11-14 NOTE — Telephone Encounter (Signed)
 Copied from CRM [id number]. Topic: General - Call Back - No Documentation >> Nov 14, 2023 12:23 PM Sheri Beck wrote: Reason for CRM: Pt stated that she picked up a FL2 form and was informed that it was incomplete. She stated that she is needing a new and a completed form filled out. Pt would like a callback once form is finished.

## 2023-11-16 ENCOUNTER — Ambulatory Visit: Payer: Self-pay | Admitting: Family Medicine

## 2023-11-16 NOTE — Telephone Encounter (Signed)
Ok to close

## 2023-11-16 NOTE — Telephone Encounter (Signed)
Mother calling on behalf of pt.   Chief Complaint: lump on R breast Symptoms: itchy, lump size of quarter Frequency: unsure, pt approached mother yesterday about it Pertinent Negatives: Patient denies fever, redness, discharge, SOB, prior hx of breast CA/disease Disposition: [] ED /[] Urgent Care (no appt availability in office) / [x] Appointment(In office/virtual)/ []  [facility]/ [] Home Care/ [] Refused Recommended Disposition /[] [facility]/ []  Follow-up with PCP Additional Notes: Pt mother calling stating the pt asked her about a lump she had found on her R breast yesterday. Pt does not have a hx of breast disease. Denies discharge. Denies redness. Pt states it is itchy and about the size of a quarter. Pt had a fever 3 days ago but it has resolved and pt also had a shingle shot last week. Pt sched for 2/14, d/t needing 24 hr notice for transportation.  Reason for Disposition  Breast lump  Answer Assessment - Initial Assessment Questions 1. SYMPTOM: "What's the main symptom you're concerned about?"  (e.g., lump, pain, rash, nipple discharge)     Itchy lump on R breast, denies any discharge 3. ONSET: "When did lump  start?"     Pt noticed it Sat 4. PRIOR HISTORY: "Do you have any history of prior problems with your breasts?" (e.g., lumps, cancer, fibrocystic breast disease)     denies 5. CAUSE: "What do you think is causing this symptom?"     unsure 6. OTHER SYMPTOMS: "Do you have any other symptoms?" (e.g., fever, breast pain, redness or rash, nipple discharge)     Did have a fever 4 days ago, but did get vaccine last week for shingles  Protocols used: Breast Symptoms-A-AH

## 2023-11-16 NOTE — Telephone Encounter (Signed)
1st attempt-no answer-not given an option to leave message. "Please enter your remote access code" was stated and then the phone went dead. Will be placed back in queue    Copied From CRM [id number]. Reason for Triage: patient is experiencing itching in nipples, lump found when palpating breast, patients states that the breast imaging center advised to schedule an appt with PCP

## 2023-11-16 NOTE — Telephone Encounter (Addendum)
  Chief Complaint: breast lump Symptoms: On right breast:  Hard lump size of marble under nipple & nipple itching Frequency: 11/13/23 Pertinent Negatives: Patient denies drainage, redness Disposition: [] ED /[] Urgent Care (no appt availability in office) / [x] Appointment(In office/virtual)/ []  [facility]/ [] Home Care/ [] Refused Recommended Disposition /[] [facility]/ []  Follow-up with PCP Additional Notes: unable to complete triage due to patient hanging up stating she hand another call coming in and needed to call me back. Will call attempt to call patient back.

## 2023-11-16 NOTE — Telephone Encounter (Signed)
Patient is sch to see Dr. Salomon Fick on 2/14

## 2023-11-16 NOTE — Telephone Encounter (Signed)
Routing to PCP office: please call pt back: pt will need appt scheduled 1 week out and transportation set up and coordinated with appt.

## 2023-11-16 NOTE — Telephone Encounter (Signed)
Called pt back to schedule appt: offered apptt for tomorrow with different provider in office: pt refused: stated needs 2 day advance notice due to transportation has to be set up: offered patient appt for Friday pt refused stated she needs something a week in advance because she need an aide to come with her: then patient hung.  Will route this information to PCP office for appt & transportation set up for pt.

## 2023-11-16 NOTE — Telephone Encounter (Signed)
Answer Assessment - Initial Assessment Questions 1. SYMPTOM: "What's the main symptom you're concerned about?"  (e.g., lump, pain, rash, nipple discharge) On right breast:  Hard lump size of marble, under nipple & nipple itching 2. LOCATION: "Where is the lump located?"     Right breast 3. ONSET: "When did lump  start?"     11/13/2023 4. PRIOR HISTORY: "Do you have any history of prior problems with your breasts?" (e.g., lumps, cancer, fibrocystic breast disease)     no 5. CAUSE: "What do you think is causing this symptom?"     unknown 6. OTHER SYMPTOMS: "Do you have any other symptoms?" (e.g., fever, breast pain, redness or rash, nipple discharge)     no 7. PREGNANCY-BREASTFEEDING: "Is there any chance you are pregnant?" "When was your last menstrual period?" "Are you breastfeeding?"     N/a  Protocols used: Breast Symptoms-A-AH

## 2023-11-18 ENCOUNTER — Other Ambulatory Visit: Payer: Self-pay | Admitting: Family Medicine

## 2023-11-18 ENCOUNTER — Encounter: Payer: Self-pay | Admitting: Family Medicine

## 2023-11-18 ENCOUNTER — Emergency Department (HOSPITAL_COMMUNITY)
Admission: EM | Admit: 2023-11-18 | Discharge: 2023-11-19 | Disposition: A | Discharge status: Home / Self Care | Payer: BLUE CROSS/BLUE SHIELD | Attending: Emergency Medicine | Admitting: Emergency Medicine

## 2023-11-18 ENCOUNTER — Other Ambulatory Visit: Payer: Self-pay

## 2023-11-18 ENCOUNTER — Emergency Department (HOSPITAL_COMMUNITY): Payer: BLUE CROSS/BLUE SHIELD

## 2023-11-18 ENCOUNTER — Ambulatory Visit (INDEPENDENT_AMBULATORY_CARE_PROVIDER_SITE_OTHER): Payer: MEDICAID | Admitting: Family Medicine

## 2023-11-18 VITALS — BP 122/86 | HR 81 | Temp 97.1°F | Ht 62.0 in

## 2023-11-18 DIAGNOSIS — Z79899 Other long term (current) drug therapy: Secondary | ICD-10-CM | POA: Insufficient documentation

## 2023-11-18 DIAGNOSIS — L304 Erythema intertrigo: Secondary | ICD-10-CM

## 2023-11-18 DIAGNOSIS — R8271 Bacteriuria: Secondary | ICD-10-CM | POA: Insufficient documentation

## 2023-11-18 DIAGNOSIS — N6315 Unspecified lump in the right breast, overlapping quadrants: Secondary | ICD-10-CM | POA: Diagnosis not present

## 2023-11-18 DIAGNOSIS — R410 Disorientation, unspecified: Secondary | ICD-10-CM

## 2023-11-18 DIAGNOSIS — Z9889 Other specified postprocedural states: Secondary | ICD-10-CM

## 2023-11-18 DIAGNOSIS — I10 Essential (primary) hypertension: Secondary | ICD-10-CM | POA: Insufficient documentation

## 2023-11-18 DIAGNOSIS — I6930 Unspecified sequelae of cerebral infarction: Secondary | ICD-10-CM | POA: Diagnosis not present

## 2023-11-18 DIAGNOSIS — Z7901 Long term (current) use of anticoagulants: Secondary | ICD-10-CM | POA: Insufficient documentation

## 2023-11-18 DIAGNOSIS — R569 Unspecified convulsions: Secondary | ICD-10-CM | POA: Insufficient documentation

## 2023-11-18 LAB — URINALYSIS, ROUTINE W REFLEX MICROSCOPIC
Bilirubin Urine: NEGATIVE
Glucose, UA: NEGATIVE mg/dL
Ketones, ur: NEGATIVE mg/dL
Nitrite: NEGATIVE
Protein, ur: NEGATIVE mg/dL
Specific Gravity, Urine: 1.029 (ref 1.005–1.030)
pH: 5 (ref 5.0–8.0)

## 2023-11-18 LAB — CBC WITH DIFFERENTIAL/PLATELET
Abs Immature Granulocytes: 0.03 10*3/uL (ref 0.00–0.07)
Basophils Absolute: 0 10*3/uL (ref 0.0–0.1)
Basophils Relative: 1 %
Eosinophils Absolute: 0.1 10*3/uL (ref 0.0–0.5)
Eosinophils Relative: 1 %
HCT: 44.4 % (ref 36.0–46.0)
Hemoglobin: 15 g/dL (ref 12.0–15.0)
Immature Granulocytes: 0 %
Lymphocytes Relative: 25 %
Lymphs Abs: 2 10*3/uL (ref 0.7–4.0)
MCH: 28.6 pg (ref 26.0–34.0)
MCHC: 33.8 g/dL (ref 30.0–36.0)
MCV: 84.6 fL (ref 80.0–100.0)
Monocytes Absolute: 0.4 10*3/uL (ref 0.1–1.0)
Monocytes Relative: 5 %
Neutro Abs: 5.4 10*3/uL (ref 1.7–7.7)
Neutrophils Relative %: 68 %
Platelets: 399 10*3/uL (ref 150–400)
RBC: 5.25 MIL/uL — ABNORMAL HIGH (ref 3.87–5.11)
RDW: 13.2 % (ref 11.5–15.5)
WBC: 7.9 10*3/uL (ref 4.0–10.5)
nRBC: 0 % (ref 0.0–0.2)

## 2023-11-18 LAB — COMPREHENSIVE METABOLIC PANEL
ALT: 29 U/L (ref 0–44)
AST: 20 U/L (ref 15–41)
Albumin: 3.4 g/dL — ABNORMAL LOW (ref 3.5–5.0)
Alkaline Phosphatase: 62 U/L (ref 38–126)
Anion gap: 13 (ref 5–15)
BUN: 24 mg/dL — ABNORMAL HIGH (ref 6–20)
CO2: 26 mmol/L (ref 22–32)
Calcium: 10.1 mg/dL (ref 8.9–10.3)
Chloride: 100 mmol/L (ref 98–111)
Creatinine, Ser: 0.71 mg/dL (ref 0.44–1.00)
GFR, Estimated: >60 mL/min (ref >60)
Glucose, Bld: 110 mg/dL — ABNORMAL HIGH (ref 70–99)
Potassium: 4.2 mmol/L (ref 3.5–5.1)
Sodium: 139 mmol/L (ref 135–145)
Total Bilirubin: 0.6 mg/dL (ref 0.0–1.2)
Total Protein: 7.4 g/dL (ref 6.5–8.1)

## 2023-11-18 MED ORDER — LEVETIRACETAM IN NACL 1000 MG/100ML IV SOLN
1000.0000 mg | Freq: Once | INTRAVENOUS | Status: AC
  Administered 2023-11-18: 1000 mg via INTRAVENOUS
  Filled 2023-11-18: qty 100

## 2023-11-18 MED ORDER — LEVETIRACETAM 500 MG PO TABS: 500.0000 mg | ORAL_TABLET | Freq: Two times a day (BID) | ORAL | 1 refills | Status: DC

## 2023-11-18 NOTE — ED Notes (Signed)
Shan Levans, fiance', [phone], please call for any updates.

## 2023-11-18 NOTE — ED Provider Notes (Signed)
[facility] AT [facility] Provider Note   CSN: [id number] Arrival date & time: 11/18/23  1456     History  Chief Complaint  Patient presents with   Seizures    Sheri Beck is a 58 y.o. female.   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  Patient reportedly had an episode last night where she was confused and was staring off for a few minutes.  No falls or trauma.  She had a couple additional episodes at the doctors office today and was sent here.  She feels okay, denies HA, new weakness or numbness, or vision changes.  She feels like she is awake during these episodes.     Home Medications Prior to Admission medications   Medication Sig Start Date End Date Taking? Authorizing Provider  levETIRAcetam (KEPPRA) 500 MG tablet Take 1 tablet (500 mg total) by mouth 2 (two) times daily. 11/18/23  Yes Laurence Spates, MD  amLODipine (NORVASC) 10 MG tablet TAKE 1/2 TABLET(5 MG) BY MOUTH DAILY. NEED APPOINTMENT FOR REFILLS. 09/06/23   Karie Georges, MD  apixaban (ELIQUIS) 5 MG TABS tablet Take 1 tablet (5 mg total) by mouth 2 (two) times daily. 11/01/23   Karie Georges, MD  busPIRone (BUSPAR) 7.5 MG tablet Take 7.5 mg by mouth 2 (two) times daily.    [provider]  Cholecalciferol (VITAMIN D3) 1.25 MG (50000 UT) CAPS Take 1 capsule (50,000 Units total) by mouth every 7 (seven) days. 03/08/23   Karie Georges, MD  diclofenac Sodium (VOLTAREN) 1 % GEL Apply 2 g topically 4 (four) times daily as needed (apply to areas of pain and inflammation.). 02/02/23   Karie Georges, MD  docusate sodium (COLACE) 100 MG capsule Take 100 mg by mouth 2 (two) times daily.    [provider]  escitalopram (LEXAPRO) 10 MG tablet Take 1 tablet (10 mg total) by mouth daily. 11/01/23   Karie Georges, MD  gabapentin (NEURONTIN) 400 MG capsule Take 1 capsule (400  mg total) by mouth 3 (three) times daily. 11/01/23   Karie Georges, MD  hydrOXYzine (ATARAX) 25 MG tablet Take 1 tablet (25 mg total) by mouth every 8 (eight) hours as needed. 11/01/23   Karie Georges, MD  lisinopril-hydrochlorothiazide (ZESTORETIC) 20-12.5 MG tablet TAKE 1 TABLET BY MOUTH DAILY 09/09/23   Karie Georges, MD  nystatin (MYCOSTATIN/NYSTOP) powder Apply 1 Application topically 2 (two) times daily as needed. 11/01/23   Karie Georges, MD  potassium chloride SA (KLOR-CON M) 20 MEQ tablet TAKE 1 TABLET(20 MEQ) BY MOUTH DAILY 11/10/23   Karie Georges, MD  promethazine (PHENERGAN) 12.5 MG tablet Take 1 tablet (12.5 mg total) by mouth every 8 (eight) hours as needed for nausea or vomiting. 03/08/23   Karie Georges, MD  senna (SENOKOT) 8.6 MG tablet Take 1-2 tablets by mouth at bedtime as needed for constipation.    [provider]      Allergies    Patient has no known allergies.    Review of Systems   Review of Systems  Neurological:  Positive for seizures.  Review of systems completed and notable as per HPI.  ROS otherwise negative.   Physical Exam Updated Vital Signs BP (!) 124/56 (BP Location: Right Arm)   Pulse 73   Temp 98.1 F (36.7 C) (Oral)   Resp 15  SpO2 96%  Physical Exam Vitals and nursing note reviewed.  Constitutional:      General: She is not in acute distress.    Appearance: She is well-developed.  HENT:     Head: Normocephalic and atraumatic.  Eyes:     Conjunctiva/sclera: Conjunctivae normal.  Cardiovascular:     Rate and Rhythm: Normal rate and regular rhythm.     Pulses: Normal pulses.     Heart sounds: Normal heart sounds. No murmur heard. Pulmonary:     Effort: Pulmonary effort is normal. No respiratory distress.     Breath sounds: Normal breath sounds.  Abdominal:     Palpations: Abdomen is soft.     Tenderness: There is no abdominal tenderness.  Musculoskeletal:        General: No swelling.     Cervical  back: Neck supple.     Right lower leg: No edema.     Left lower leg: No edema.  Skin:    General: Skin is warm and dry.     Capillary Refill: Capillary refill takes less than 2 seconds.  Neurological:     Mental Status: She is alert and oriented to person, place, and time. Mental status is at baseline.     Cranial Nerves: No cranial nerve deficit.     Sensory: No sensory deficit.     Comments: Baseline L hemiparesis.  Sensation is intact.  Normal speech.  No dysarthria or aphasia.  No gaze preference.  Extraocular movements are intact.  Counts fingers in all visual fields.  She did have a brief episode while was talking to her where she seemed to trial off for about 20 seconds before returning to normal.  No convulsions.  Psychiatric:        Mood and Affect: Mood normal.     ED Results / Procedures / Treatments   Labs (all labs ordered are listed, but only abnormal results are displayed) Labs Reviewed  CBC WITH DIFFERENTIAL/PLATELET - Abnormal; Notable for the following components:      Result Value   RBC 5.25 (*)    All other components within normal limits  URINALYSIS, ROUTINE W REFLEX MICROSCOPIC - Abnormal; Notable for the following components:   APPearance HAZY (*)    Hgb urine dipstick SMALL (*)    Leukocytes,Ua TRACE (*)    Bacteria, UA RARE (*)    All other components within normal limits  COMPREHENSIVE METABOLIC PANEL - Abnormal; Notable for the following components:   Glucose, Bld 110 (*)    BUN 24 (*)    Albumin 3.4 (*)    All other components within normal limits  URINE CULTURE    EKG None  Radiology CT Head Wo Contrast Result Date: 11/18/2023 CLINICAL DATA:  Mental status change, unknown cause EXAM: CT HEAD WITHOUT CONTRAST TECHNIQUE: Contiguous axial images were obtained from the base of the skull through the vertex without intravenous contrast. RADIATION DOSE REDUCTION: This exam was performed according to the departmental dose-optimization program which  includes automated exposure control, adjustment of the mA and/or kV according to patient size and/or use of iterative reconstruction technique. COMPARISON:  CT head 05/22/2023. FINDINGS: Brain: Large area of encephalomalacia in the right MCA territory. No evidence of acute large vascular territory infarct, acute hemorrhage, mass lesion or significant midline shift. Remote right cerebellar infarct Vascular: Calcific atherosclerosis. Skull: No acute fracture.  Right hemi craniectomy. Sinuses/Orbits: Clear sinuses.  No acute orbital findings. IMPRESSION: 1. No evidence of acute intracranial abnormality. 2. Large  right MCA territory encephalomalacia and small remote right cerebellar infarct. Electronically Signed   By: Feliberto Harts M.D.   On: 11/18/2023 19:37    Procedures Procedures    Medications Ordered in ED Medications  levETIRAcetam (KEPPRA) IVPB 1000 mg/100 mL premix (0 mg Intravenous Stopped 11/18/23 2207)    ED Course/ Medical Decision Making/ A&P Clinical Course as of 11/18/23 2237  Fri Nov 18, 2023  1747 Patient remained stable.  No concerns. [JD]  1900 Mother [name] who is POA: shaking and out of it last night.  Had episode of doctors office, whole body was shaking, not responding for about 2-3 seconds.  Confused for short period of time.  Trouble taking care of herself. [JD]  2055 Kirkpatrick: Keppra 1 g now and 500 mg BID [JD]    Clinical Course User Index [JD] Laurence Spates, MD                                 Medical Decision Making Amount and/or Complexity of Data Reviewed Labs: ordered. Radiology: ordered.  Risk Prescription drug management.   Medical Decision Making:   MERCADEZ HEITMAN is a 58 y.o. female who presented to the ED today with episodes of altered mental status and possible seizure.  Episodes are reported as staring off and being less responsive and potential absence seizure's.  She has significant history of prior stroke.  On my exam she has baseline  left-sided hemiparesis but no other deficits to suggest new stroke.  She had a brief episode where she stared off while I was examining her that resolved on its own.  Unclear if these are possible focal seizures.   Patient placed on continuous vitals and telemetry monitoring while in ED which was reviewed periodically.  Reviewed and confirmed nursing documentation for past medical history, family history, social history.  Reassessment and Plan:   CT head shows sequela of prior MCA infarct and other remote infarct.  No acute findings.  Lab work overall reassuring.  She has some bacteriuria, however she is squames as well and I wonder if it may be contaminated given she has no urinary symptoms.  Will culture.  No signs of sepsis.  I discussed with neurology, they recommend starting Keppra 500 mg twice daily after loading here and she can follow-up safely as an outpatient.  Patient is comfortable this plan.  She would like to go home.  She remains at baseline without any recurrent episodes.  Given Keppra prescription and discharged in stable condition.  Patient's presentation is most consistent with acute complicated illness / injury requiring diagnostic workup.         Final Clinical Impression(s) / ED Diagnoses Final diagnoses:  Seizure-like activity [facility])    Rx / DC Orders ED Discharge Orders          Ordered    levETIRAcetam (KEPPRA) 500 MG tablet  2 times daily        11/18/23 2139              Laurence Spates, MD 11/18/23 2237

## 2023-11-18 NOTE — Progress Notes (Signed)
Established Patient Office Visit   Subjective  Patient ID: Sheri Beck, female    DOB: 06/16/1966  Age: 58 y.o. MRN: [id number]  No chief complaint on file.   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 Casimiro Needle and seen for acute concern.  Some history provided by patient, majority provided by patient's mother [name] and her SO [name].  Patient initially scheduled for concern of lump in right breast times a "while".  Skin itches near area.  Patient intermittently able to express tan fluid from right nipple.  Denies skin changes, nipple inversion, edema, erythema of right breast.  Pt's mother also states she is still having issues with yeast infection underneath breast.  Using nystatin cream.  Family members concerned as patient had a brief episode of staring off last night and shaking around 6:30 pm.  Patient had 2 similar episodes here in clinic.  Patient denies pain, dysuria, constipation, nausea or vomiting, fever, change in appetite or hydration status.  Patient mentions head feels swollen.  Has history of shunt.  Patient also states she feels like she is "burning inside".    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   Major depressive disorder, single episode, 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   Adjustment disorder with depressed mood 10/21/2022   Delirium due to multiple etiologies, acute, hypoactive 10/21/2022    Tobacco use 10/21/2022   Cocaine dependence (HCC) 10/21/2022   Episodic cannabis use 10/21/2022   Snoring 10/21/2022   Alcohol use 10/21/2022   Stroke (HCC) 10/16/2022   Stroke (cerebrum) (HCC) 10/16/2022   Hyperlipidemia 09/28/2022   Severe obesity (BMI 35.0-39.9) with comorbidity (HCC) 09/28/2022   Acute bacterial conjunctivitis of right eye 05/06/2020   Dental abscess 05/06/2020   Essential hypertension 05/06/2020   Past Medical History:  Diagnosis Date   Hypertension    Stroke [facility]) 10/2022   Past Surgical History:  Procedure Laterality Date   CRANIOTOMY Right 10/16/2022   Procedure: RIGHT CRANIECTOMY;  Surgeon: Tressie Stalker, MD;  Location: [facility] OR;  Service: Neurosurgery;  Laterality: Right;   Social History   Tobacco Use   Smoking status: Former    Current packs/day: 0.00    Types: Cigarettes    Quit date: 10/14/2022    Years since quitting: 1.0    Passive exposure: Past   Smokeless tobacco: Never  Vaping Use   Vaping status: Never Used  Substance Use Topics   Alcohol use: Not Currently    Comment: occasional   Drug use: Not Currently    Types: Cocaine    Comment: Pt states last use was 10/15/22   Family History  Problem Relation Age of Onset   Cancer Mother    Heart disease Father    Glaucoma Father    Varicose Veins Paternal Grandfather    Breast cancer Neg Hx    No Known Allergies    ROS Negative unless stated above    Objective:  BP 122/86 (BP Location: Right Arm, Patient Position: Sitting, Cuff Size: Large)   Pulse 81   Temp (!) 97.1 F (36.2 C) (Oral)   Ht 5\' 2"  (1.575 m)   SpO2 97%   BMI 33.47 kg/m  BP Readings from Last 3 Encounters:  11/18/23 122/86  11/01/23 112/80  05/22/23 (!) 144/96   Wt Readings from Last 3 Encounters:  04/25/23 182 lb 15.7 oz (83 kg)  03/12/23 184 lb 1.4 oz (83.5 kg)  02/21/23 184 lb 1.4 oz (83.5 kg)      Physical Exam Constitutional:      Appearance: She is obese. She is not ill-appearing or  toxic-appearing.     Comments: Pt intermittently staring off/dazed x 3-5 secs and unable to respond to voice commands during episode.  HENT:     Head: Normocephalic and atraumatic.     Nose: Nose normal.     Mouth/Throat:     Mouth: Mucous membranes are moist.  Cardiovascular:     Rate and Rhythm: Normal rate.  Pulmonary:     Effort: Pulmonary effort is normal.     Breath sounds: Normal breath sounds.  Chest:       Comments: 1.2 cm x 2.5 cm elongated soft mass in R breast lateral to areaola at 9 o'clock position. mild TTP. Abdominal:     General: Bowel sounds are normal.     Palpations: Abdomen is soft.  Skin:    General: Skin is warm and dry.     Comments: A quarter sized ulcer underneath lateral right breast.  A smaller ulcerated lesion of lateral upper right breast.  No drainage noted.  Skin underneath breast moist.  Neurological:     Mental Status: She is easily aroused. She is lethargic.     Cranial Nerves: Cranial nerve deficit present.     Motor: Weakness, atrophy and abnormal muscle tone present.     Comments: Left-sided hemiparesis.  Sitting in transport wheelchair.  Gait not assessed.  Patient required 3 person assist assistance pulling pants up.  Psychiatric:        Behavior: Behavior is cooperative.    No results found for any visits on 11/18/23.    Assessment & Plan:  Confusion  Breast lump on right side at 9 o'clock position -     MM 3D DIAGNOSTIC MAMMOGRAM BILATERAL BREAST; Future  History of CVA with residual deficit  History of craniotomy  Intertrigo  Patient is a 58 year old female seen for acute breast concern who also presented with confusion and episodes of staring off x 1 day.  Concern for possible absence seizure (no post-ictal state) versus encephalopathy or infection.  Currently does not meet SIRS criteria. Pt with s/p decompressive craniotomy for R MCA brain edema 10/16/2022 with flap into abdomen, JP drain in place.  Also with concern for shunt  malfunction.  Given patient's current symptoms advised ED for further evaluation a more appropriate setting.  Family agrees with disposition.  Questions answered to satisfaction.  EMS contacted.  Diagnostic mammogram and u/s as outpt.  Neoplasm vs abscess.  Return if symptoms worsen or fail to improve.   Deeann Saint, MD

## 2023-11-18 NOTE — Discharge Instructions (Signed)
The episodes are concerning for seizure.  You will be started on a seizure medicine.  You should follow-up closely with your neurologist.  Return for new or worsening symptoms.

## 2023-11-18 NOTE — ED Notes (Signed)
Patient cleaned, dried, and repositioned. Mepilex placed on 2 wounds on her buttocks, Earlene Plater, MD notified.

## 2023-11-18 NOTE — ED Notes (Signed)
Please call a family member when discharged

## 2023-11-18 NOTE — ED Triage Notes (Signed)
Patient arrives from MD office via EMS for eval of absent seizure like activity beginning yesterday at 1900. Patient has had multiple 3-5 second episodes where she does not respond and stares off to one side. States after the episodes she feels like she has nerve pain. Hx of stroke with L sided deficits.

## 2023-11-20 LAB — URINE CULTURE: Culture: 80000 — AB

## 2023-11-21 ENCOUNTER — Telehealth: Payer: Self-pay

## 2023-11-21 ENCOUNTER — Telehealth (HOSPITAL_BASED_OUTPATIENT_CLINIC_OR_DEPARTMENT_OTHER): Payer: Self-pay | Admitting: *Deleted

## 2023-11-21 NOTE — Telephone Encounter (Signed)
Copied from CRM [id number]. Topic: Medical Record Request - Other >> Nov 21, 2023  4:13 PM Adaysia C wrote: Reason for CRM: Patients mother([name]) called in about the provider completing the "L2" forms for the patient to be in an nursing home because the patient is now having seizures at night and needs constant around the clock care. Please follow up with patients mother([name]) [phone]

## 2023-11-21 NOTE — Telephone Encounter (Signed)
Post ED Visit - Positive Culture Follow-up  Culture report reviewed by antimicrobial stewardship pharmacist: [facility] [x]  [place], [place].D. []  Celedonio Miyamoto, Pharm.D., BCPS AQ-ID []  Garvin Fila, Pharm.D., BCPS []  Georgina Pillion, [address].D., BCPS []  [place], [place].D., BCPS, AAHIVP []  Estella Husk, Pharm.D., BCPS, AAHIVP []  Lysle Pearl, PharmD, BCPS []  Phillips Climes, PharmD, BCPS []  Agapito Games, PharmD, BCPS []  Verlan Friends, PharmD []  Mervyn Gay, PharmD, BCPS []  Vinnie Level, PharmD  [facility] []  Len Childs, PharmD []  Greer Pickerel, PharmD []  Adalberto Cole, PharmD []  Perlie Gold, Rph []  Lonell Face) Jean Rosenthal, PharmD []  Earl Many, PharmD []  Junita Push, PharmD []  Dorna Leitz, PharmD []  Terrilee Files, PharmD []  Lynann Beaver, PharmD []  Keturah Barre, PharmD []  Loralee Pacas, PharmD []  Bernadene Person, PharmD   Positive urine culture Treated with no antibiotic,  Patient with no urinary symptom. No follow-up is required at this time.  Bing Quarry 11/21/2023, 9:39 AM

## 2023-11-21 NOTE — Telephone Encounter (Signed)
Copied from CRM [id number]. Topic: General - Other >> Nov 21, 2023  9:45 AM Florestine Avers wrote: Reason for CRM: Patients daughter called inquiring about the "L2" form. She wanted to know if the form was filled out and ready for pick up because she wants to put the patient in a nursing home. She states the patient is having seizures again and needs to place her in a home. Patient is requesting a call back.

## 2023-11-24 NOTE — Telephone Encounter (Signed)
PCP stated she completed the FL-2 form the week of 2/4.  I reached out to [facility] medical records and the batch center to see if the form could be located as it has not been scanned in.  Teams message was received from [place] stating the FL-2 form cannot be found.  Message sent to PCP.

## 2023-11-24 NOTE — Telephone Encounter (Signed)
Placed in the red folder. 

## 2023-11-24 NOTE — Telephone Encounter (Signed)
Could you print a new one for me?

## 2023-11-25 NOTE — Telephone Encounter (Signed)
Pt's mother calling back states the paperwork is still not filled out correctly, she will bring it back Monday in hopes of having it corrected right then so they can go forward with getting the patient into the nursing home.

## 2023-11-25 NOTE — Telephone Encounter (Signed)
Pt mom is aware fl2 form will be completed today 2-21 by 3 pm

## 2023-11-25 NOTE — Telephone Encounter (Signed)
I Spoke with dr Casimiro Needle and she will have generic FL2 form ready for pick up by 3 pm today 11-25-2023 and e2c2 will relay the message

## 2023-11-28 NOTE — Telephone Encounter (Signed)
 The form was blank with highlighted areas to basically complete the entire form.  Dr Casimiro Needle stated she did this on Friday and asked the assistant that was working with her to make a copy, which I do not see here.  Teams message was sent to [place], manager to be on the lookout for the form as per Nelva Bush at the front desk, all paperwork from Friday has been sent to be scanned.

## 2023-11-28 NOTE — Telephone Encounter (Signed)
 Patient dropped off document FMLA, to be filled out by provider. Patient requested to send it back via Call Patient to pick up within 5-days. Document is located in providers tray at front office. Please follow up with patients mother([name]) [phone]

## 2023-11-28 NOTE — Telephone Encounter (Signed)
 So she needs to tell me what is not correct -- please make sure she leaves a note as to what is not correct--

## 2023-11-29 NOTE — Telephone Encounter (Signed)
 I think the best course of action is to have the mom bring back the form that I filled out and point out specifically what was incomplete so I can correct it

## 2023-11-29 NOTE — Telephone Encounter (Signed)
 Form returned and placed in the red folder.

## 2023-11-29 NOTE — Telephone Encounter (Signed)
 PCP added info to the original form (labeled as NUMBER 1 to be scanned) under numbers 1,2,3; 10-14 and entered "regular" diet under nutritional status.  PCP stated the "provider number" would be the provider that treats her in the home and she is not aware of this information.  A copy of the medication list was attached to the original form and left at the front desk for pick up.  Copies of both were sent to be scanned also and NUMBER 2 was placed at the top (to determine the difference between the corrected copy once scanned).   Spoke with [name], the patient's mother and [phone]) informed her of this.

## 2023-11-29 NOTE — Telephone Encounter (Signed)
 Per Nelva Bush, she spoke with the patient's mother on 2/24 and she agreed to bring the original form back to the office.

## 2023-12-13 ENCOUNTER — Telehealth: Payer: Self-pay | Admitting: Family Medicine

## 2023-12-13 NOTE — Telephone Encounter (Signed)
 FL-2 form was completed by PCP and faxed to [facility] (see media tab dated 3/11) on 12/07/2023 attn: Wendy-ph#[phone].  I left Toniann Fail a detailed message requesting she call back to confirm she received the fax.

## 2023-12-13 NOTE — Telephone Encounter (Signed)
 Copied from CRM [id number]. Topic: Medical Record Request - Other >> Dec 13, 2023 11:44 AM Orinda Kenner C wrote: Reason for CRM: Patient's mother [name] [phone] gave nursing home forms to the office last week an checking on the status if it was faxed. [name] wants to speak someone in the office now. Per CAL, please send message.

## 2023-12-25 ENCOUNTER — Other Ambulatory Visit: Payer: Self-pay | Admitting: Family Medicine

## 2023-12-25 DIAGNOSIS — G4701 Insomnia due to medical condition: Secondary | ICD-10-CM

## 2024-01-16 ENCOUNTER — Telehealth: Payer: Self-pay | Admitting: *Deleted

## 2024-01-16 NOTE — Progress Notes (Unsigned)
 Complex Care Management Note Care Guide Note  01/16/2024 Name: Sheri Beck MRN: [id number] DOB: [birth year]   Complex Care Management Outreach Attempts: An unsuccessful telephone outreach was attempted today to offer the patient information about available complex care management services.  Follow Up Plan:  Additional outreach attempts will be made to offer the patient complex care management information and services.   Encounter Outcome:  No Answer  Kandis Ormond, CMA [place]  [facility], [facility] Guide Direct Dial: [phone]  Fax: [phone] Website: [email or web address]

## 2024-01-17 NOTE — Progress Notes (Unsigned)
 Complex Care Management Note Care Guide Note  01/17/2024 Name: Sheri Beck MRN: [id number] DOB: [birth year]   Complex Care Management Outreach Attempts: A second unsuccessful outreach was attempted today to offer the patient with information about available complex care management services.  Follow Up Plan:  Additional outreach attempts will be made to offer the patient complex care management information and services.   Encounter Outcome:  No Answer  Kandis Ormond, CMA [place]  [facility], [facility] Guide Direct Dial: [phone]  Fax: [phone] Website: [email or web address]

## 2024-01-18 NOTE — Progress Notes (Signed)
 Complex Care Management Note Care Guide Note  01/18/2024 Name: Sheri Beck MRN: [id number] DOB: 11-15-1965   Complex Care Management Outreach Attempts: A third unsuccessful outreach was attempted today to offer the patient with information about available complex care management services.  Follow Up Plan:  No further outreach attempts will be made at this time. We have been unable to contact the patient to offer or enroll patient in complex care management services.  Encounter Outcome:  No Answer  Kandis Ormond, CMA [place]  [facility], [facility] Guide Direct Dial: [phone]  Fax: [phone] Website: [email or web address]

## 2024-01-31 ENCOUNTER — Other Ambulatory Visit: Payer: Self-pay | Admitting: Family Medicine

## 2024-01-31 DIAGNOSIS — M549 Dorsalgia, unspecified: Secondary | ICD-10-CM

## 2024-02-20 ENCOUNTER — Telehealth: Payer: Self-pay | Admitting: Family Medicine

## 2024-02-20 NOTE — Telephone Encounter (Signed)
 Copied from CRM [id number]. Topic: Clinical - Medication Question >> Feb 20, 2024  8:00 AM Alysia Jumbo S wrote: Reason for CRM: Patient is wanting to know if provider will send a prescription for her to get patches to quit smoking. She is unsure of the name of patches, but states she started getting them when she was in the nursing home.

## 2024-02-22 ENCOUNTER — Telehealth: Payer: Self-pay | Admitting: Family Medicine

## 2024-02-22 NOTE — Telephone Encounter (Signed)
 Spoke with Peterson Brandt as a form has not been received as below.  Requested [place] fax the form to the office for review by PCP when she returns to the office tomorrow.  She was informed PCP will be seeing patients and there is a 48-72 turnaround time for forms.

## 2024-02-22 NOTE — Telephone Encounter (Signed)
 Copied from CRM [id number]. Topic: General - Other >> Feb 22, 2024 12:06 PM Emmet Harm C wrote: Reason for CRM: Peterson Brandt from [employer] checking in to see if the form she faxed over for patient was received, the form is a [employer] form by [employer] on top of form DMR 3051 her callback [phone]

## 2024-02-23 NOTE — Telephone Encounter (Signed)
 Left a message at the patient's cell number to call the office with further information as below.

## 2024-02-23 NOTE — Telephone Encounter (Signed)
 We can-- how much does she smoke per day?

## 2024-02-24 NOTE — Telephone Encounter (Signed)
 [facility] fax over a blank [id number] .The form in dr Bambi Lever folder

## 2024-02-28 ENCOUNTER — Other Ambulatory Visit: Payer: Self-pay | Admitting: Family Medicine

## 2024-02-28 DIAGNOSIS — E876 Hypokalemia: Secondary | ICD-10-CM

## 2024-02-29 ENCOUNTER — Emergency Department (HOSPITAL_COMMUNITY)
Admission: EM | Admit: 2024-02-29 | Discharge: 2024-03-01 | Disposition: A | Discharge status: Home / Self Care | Payer: MEDICAID | Attending: Emergency Medicine | Admitting: Emergency Medicine

## 2024-02-29 DIAGNOSIS — I10 Essential (primary) hypertension: Secondary | ICD-10-CM | POA: Diagnosis not present

## 2024-02-29 DIAGNOSIS — K6289 Other specified diseases of anus and rectum: Secondary | ICD-10-CM | POA: Insufficient documentation

## 2024-02-29 DIAGNOSIS — Z7901 Long term (current) use of anticoagulants: Secondary | ICD-10-CM | POA: Insufficient documentation

## 2024-02-29 DIAGNOSIS — R519 Headache, unspecified: Secondary | ICD-10-CM | POA: Diagnosis present

## 2024-02-29 DIAGNOSIS — Z79899 Other long term (current) drug therapy: Secondary | ICD-10-CM | POA: Insufficient documentation

## 2024-02-29 NOTE — Telephone Encounter (Signed)
 Left a message for the patient to return a call with information as below.

## 2024-02-29 NOTE — ED Triage Notes (Signed)
 Pt arriving via [facility] from home for headache. Pt reports she has a caregiver that normally takes care of her but says that her caregiver has been withholding her meds for approx 1 week. However, when EMS was loading pt into ambulance, pts neighbor came out with pts meds and told them pt was just given her regular meds. Pt received 650mg  Tylenol  from EMS prior to arrival.

## 2024-02-29 NOTE — ED Provider Notes (Signed)
 [place] EMERGENCY DEPARTMENT AT [facility] Provider Note   CSN: [id number] Arrival date & time: 02/29/24  1939     History  Chief Complaint  Patient presents with   Headache   HPI Sheri Beck is a 58 y.o. female with history of stroke and hypertension presenting for headache.  She states it has been a mild headache that started 2 days ago.  Denies visual disturbance.  Denies thunderclap symptoms.  States she has had a headache like this before.  Denies fall.  Denies any new weakness or numbness in her extremities.  Does report that she has had left-sided residual deficits since her stroke.  EMS was called and she was given Tylenol  en route.  She states now that her headache is completely resolved.  Her primary complaint at this time is that her "bottom is itching".   Headache      Home Medications Prior to Admission medications   Medication Sig Start Date End Date Taking? Authorizing Provider  amLODipine  (NORVASC ) 10 MG tablet TAKE 1/2 TABLET(5 MG) BY MOUTH DAILY. NEED APPOINTMENT FOR REFILLS. 09/06/23   Aida House, MD  apixaban  (ELIQUIS ) 5 MG TABS tablet Take 1 tablet (5 mg total) by mouth 2 (two) times daily. 11/01/23   Aida House, MD  busPIRone  (BUSPAR ) 7.5 MG tablet Take 7.5 mg by mouth 2 (two) times daily.    [provider]  Cholecalciferol (VITAMIN D3) 1.25 MG (50000 UT) CAPS Take 1 capsule (50,000 Units total) by mouth every 7 (seven) days. 03/08/23   Aida House, MD  diclofenac  Sodium (VOLTAREN ) 1 % GEL Apply 2 g topically 4 (four) times daily as needed (apply to areas of pain and inflammation.). 02/02/23   Aida House, MD  docusate sodium  (COLACE) 100 MG capsule Take 100 mg by mouth 2 (two) times daily.    [provider]  escitalopram  (LEXAPRO ) 10 MG tablet Take 1 tablet (10 mg total) by mouth daily. 11/01/23   Aida House, MD  gabapentin  (NEURONTIN ) 400 MG capsule TAKE 1 CAPSULE(400 MG) BY MOUTH THREE TIMES  DAILY 01/31/24   Aida House, MD  hydrOXYzine  (ATARAX ) 25 MG tablet TAKE 1 TABLET(25 MG) BY MOUTH EVERY 8 HOURS AS NEEDED 12/26/23   Aida House, MD  levETIRAcetam  (KEPPRA ) 500 MG tablet Take 1 tablet (500 mg total) by mouth 2 (two) times daily. 11/18/23   Davis, Jonathon H, MD  lisinopril -hydrochlorothiazide  (ZESTORETIC ) 20-12.5 MG tablet TAKE 1 TABLET BY MOUTH DAILY 09/09/23   Aida House, MD  nystatin  (MYCOSTATIN /NYSTOP ) powder Apply 1 Application topically 2 (two) times daily as needed. 11/01/23   Aida House, MD  potassium chloride  SA (KLOR-CON  M) 20 MEQ tablet TAKE 1 TABLET(20 MEQ) BY MOUTH DAILY 02/29/24   Aida House, MD  promethazine  (PHENERGAN ) 12.5 MG tablet Take 1 tablet (12.5 mg total) by mouth every 8 (eight) hours as needed for nausea or vomiting. 03/08/23   Aida House, MD  senna (SENOKOT) 8.6 MG tablet Take 1-2 tablets by mouth at bedtime as needed for constipation.    [provider]      Allergies    Patient has no known allergies.    Review of Systems   Review of Systems  Neurological:  Positive for headaches.    Physical Exam   Vitals:   02/29/24 2200 02/29/24 2213  BP: (!) 128/98   Pulse: 79   Resp: 16   Temp:  97.8 F (36.6  C)  SpO2: 100%     CONSTITUTIONAL:  well-appearing, NAD NEURO:  Alert and oriented x 3, CN 3-12 grossly intact, normal strength in right upper and lower extremity.  Left arm and left leg notably weaker.  Sensation intact to light touch distally in the upper and lower extremities. EYES:  eyes equal and reactive ENT/NECK:  Supple, no stridor  CARDIO:  regular rate and  rhythm, appears well-perfused  PULM:  No respiratory distress, CTAB GI/GU:  non-distended, skin irritation noted to the left and right buttock, no pressure ulcers or evidence of abscess, no anal fissure or hemorrhoid noted MSK/SPINE:  No gross deformities, no edema, moves all extremities  SKIN:  no rash, atraumatic  *Additional  and/or pertinent findings included in MDM below   ED Results / Procedures / Treatments   Labs (all labs ordered are listed, but only abnormal results are displayed) Labs Reviewed - No data to display  EKG None  Radiology No results found.  Procedures Procedures    Medications Ordered in ED Medications - No data to display  ED Course/ Medical Decision Making/ A&P                                 Medical Decision Making  58 year old well-appearing female present for headache and irritation to her buttock.  Exam was unremarkable.  Reviewed neurology note from last year and motor function of the left at that visit was notably reduced.  Appears to be motor deficit from her stroke and unchanged during this encounter.  On serial reassessment she stated that her headache completely resolved.  Did apply some barrier cream to her left and right buttock and perianal area.  Sent home with a barrier cream.  Advised to follow-up with her PCP.  Headache is likely benign given no thunderclap symptoms, no fever or meningeal symptoms.  Considered stroke but unlikely given no new residual deficits on exam.  Blood pressure also reassuring on second check waking hypertensive emergency less likely as well.  Discussed return precautions.  Discharged in good condition.  Plan is to transfer back home via ambulance.        Final Clinical Impression(s) / ED Diagnoses Final diagnoses:  Nonintractable headache, unspecified chronicity pattern, unspecified headache type  Perirectal skin irritation    Rx / DC Orders ED Discharge Orders     None         Janalee Mcmurray, PA-C 02/29/24 2248    Tegeler, Marine Sia, MD 02/29/24 2342

## 2024-02-29 NOTE — Discharge Instructions (Addendum)
 Evaluation for headache and irritation to the skin of your buttock are overall reassuring.  Please follow-up with your PCP.  Also please continue to apply the barrier cream daily.  Advised her caretaker to inspect it daily and to keep clean with soap and water along with changing her position every 2 hours.  If you have another headache, worsening weakness or numbness in your extremities, have visual disturbance or any other concerning symptom please return to the ED for further evaluation.

## 2024-03-01 NOTE — ED Notes (Signed)
 Attempted to call pt's mother per pt's request to let her know pt was being transported home at this time.

## 2024-03-07 ENCOUNTER — Telehealth: Payer: Self-pay

## 2024-03-07 NOTE — Telephone Encounter (Signed)
 Copied from CRM [id number]. Topic: General - Other >> Mar 07, 2024 11:45 AM Howard Macho wrote: Reason for CRM: patient daughter called stating she has a form that need to be filled out for the patient. The form is called [id number] for the patient to receive home care services. Patient daughter will fax the documents to the office [phone]

## 2024-03-07 NOTE — Telephone Encounter (Signed)
 PCP completed the form on 5/28 and I have attempted to fax the form to [phone] with a busy response.  I called provider support services at [phone] and was given an alterate number of [phone].  Form was faxed, confirmation received and sent to be scanned.

## 2024-03-08 ENCOUNTER — Telehealth: Payer: Self-pay | Admitting: *Deleted

## 2024-03-08 NOTE — Telephone Encounter (Signed)
 Copied from CRM [id number]. Topic: Medical Record Request - Provider/Facility Request >> Mar 08, 2024  9:01 AM Dimple Francis wrote: Reason for CRM:  Demographic area on form [id number]  was incomplete. They need updated office visit with Dr Bambi Lever. Best fax # [phone] - Diego Foy from [facility]

## 2024-03-08 NOTE — Telephone Encounter (Signed)
 Form was sent to be scanned.  Office note from 11/01/2023 and demographics information sheet was printed and faxed to Danielle at [phone].

## 2024-03-10 ENCOUNTER — Encounter (HOSPITAL_COMMUNITY): Payer: Self-pay

## 2024-03-10 ENCOUNTER — Other Ambulatory Visit: Payer: Self-pay

## 2024-03-10 ENCOUNTER — Emergency Department (HOSPITAL_COMMUNITY)
Admission: EM | Admit: 2024-03-10 | Discharge: 2024-03-11 | Disposition: A | Discharge status: Home / Self Care | Payer: MEDICAID | Attending: Emergency Medicine | Admitting: Emergency Medicine

## 2024-03-10 DIAGNOSIS — I10 Essential (primary) hypertension: Secondary | ICD-10-CM | POA: Insufficient documentation

## 2024-03-10 DIAGNOSIS — Z7901 Long term (current) use of anticoagulants: Secondary | ICD-10-CM | POA: Insufficient documentation

## 2024-03-10 DIAGNOSIS — Z8673 Personal history of transient ischemic attack (TIA), and cerebral infarction without residual deficits: Secondary | ICD-10-CM | POA: Insufficient documentation

## 2024-03-10 DIAGNOSIS — R4689 Other symptoms and signs involving appearance and behavior: Secondary | ICD-10-CM | POA: Insufficient documentation

## 2024-03-10 DIAGNOSIS — Z87891 Personal history of nicotine dependence: Secondary | ICD-10-CM | POA: Diagnosis not present

## 2024-03-10 DIAGNOSIS — R45851 Suicidal ideations: Secondary | ICD-10-CM | POA: Diagnosis not present

## 2024-03-10 DIAGNOSIS — Z79899 Other long term (current) drug therapy: Secondary | ICD-10-CM | POA: Diagnosis not present

## 2024-03-10 LAB — CBC WITH DIFFERENTIAL/PLATELET
Abs Immature Granulocytes: 0.01 10*3/uL (ref 0.00–0.07)
Basophils Absolute: 0.1 10*3/uL (ref 0.0–0.1)
Basophils Relative: 1 %
Eosinophils Absolute: 0.4 10*3/uL (ref 0.0–0.5)
Eosinophils Relative: 7 %
HCT: 47.1 % — ABNORMAL HIGH (ref 36.0–46.0)
Hemoglobin: 15.2 g/dL — ABNORMAL HIGH (ref 12.0–15.0)
Immature Granulocytes: 0 %
Lymphocytes Relative: 51 %
Lymphs Abs: 3.2 10*3/uL (ref 0.7–4.0)
MCH: 28.6 pg (ref 26.0–34.0)
MCHC: 32.3 g/dL (ref 30.0–36.0)
MCV: 88.7 fL (ref 80.0–100.0)
Monocytes Absolute: 0.4 10*3/uL (ref 0.1–1.0)
Monocytes Relative: 7 %
Neutro Abs: 2.2 10*3/uL (ref 1.7–7.7)
Neutrophils Relative %: 34 %
Platelets: 291 10*3/uL (ref 150–400)
RBC: 5.31 MIL/uL — ABNORMAL HIGH (ref 3.87–5.11)
RDW: 14.3 % (ref 11.5–15.5)
WBC: 6.3 10*3/uL (ref 4.0–10.5)
nRBC: 0 % (ref 0.0–0.2)

## 2024-03-10 LAB — COMPREHENSIVE METABOLIC PANEL WITH GFR
ALT: 20 U/L (ref 0–44)
AST: 24 U/L (ref 15–41)
Albumin: 3.9 g/dL (ref 3.5–5.0)
Alkaline Phosphatase: 79 U/L (ref 38–126)
Anion gap: 5 (ref 5–15)
BUN: 14 mg/dL (ref 6–20)
CO2: 27 mmol/L (ref 22–32)
Calcium: 9.7 mg/dL (ref 8.9–10.3)
Chloride: 108 mmol/L (ref 98–111)
Creatinine, Ser: 0.59 mg/dL (ref 0.44–1.00)
GFR, Estimated: >60 mL/min (ref >60)
Glucose, Bld: 104 mg/dL — ABNORMAL HIGH (ref 70–99)
Potassium: 4.3 mmol/L (ref 3.5–5.1)
Sodium: 140 mmol/L (ref 135–145)
Total Bilirubin: 1.2 mg/dL (ref 0.0–1.2)
Total Protein: 8 g/dL (ref 6.5–8.1)

## 2024-03-10 LAB — ETHANOL: Alcohol, Ethyl (B): <15 mg/dL (ref <15)

## 2024-03-10 MED ORDER — LISINOPRIL-HYDROCHLOROTHIAZIDE 20-12.5 MG PO TABS: 1.0000 | ORAL_TABLET | Freq: Every day | ORAL | Status: DC

## 2024-03-10 MED ORDER — PROMETHAZINE HCL 25 MG PO TABS: 12.5000 mg | ORAL_TABLET | Freq: Three times a day (TID) | ORAL | Status: DC | PRN

## 2024-03-10 MED ORDER — AMLODIPINE BESYLATE 5 MG PO TABS
5.0000 mg | ORAL_TABLET | Freq: Every day | ORAL | Status: DC
  Administered 2024-03-11: 5 mg via ORAL
  Filled 2024-03-10: qty 1

## 2024-03-10 MED ORDER — BUSPIRONE HCL 5 MG PO TABS
7.5000 mg | ORAL_TABLET | Freq: Two times a day (BID) | ORAL | Status: DC
  Filled 2024-03-10: qty 1.5

## 2024-03-10 MED ORDER — LEVETIRACETAM 500 MG PO TABS
500.0000 mg | ORAL_TABLET | Freq: Two times a day (BID) | ORAL | Status: DC
  Administered 2024-03-11: 500 mg via ORAL
  Filled 2024-03-10: qty 1

## 2024-03-10 MED ORDER — DOCUSATE SODIUM 100 MG PO CAPS
100.0000 mg | ORAL_CAPSULE | Freq: Two times a day (BID) | ORAL | Status: DC
  Administered 2024-03-11: 100 mg via ORAL
  Filled 2024-03-10: qty 1

## 2024-03-10 MED ORDER — GABAPENTIN 400 MG PO CAPS: 400.0000 mg | ORAL_CAPSULE | Freq: Three times a day (TID) | ORAL | Status: DC

## 2024-03-10 MED ORDER — APIXABAN 2.5 MG PO TABS
5.0000 mg | ORAL_TABLET | Freq: Two times a day (BID) | ORAL | Status: DC
  Administered 2024-03-11: 5 mg via ORAL
  Filled 2024-03-10 (×2): qty 2

## 2024-03-10 MED ORDER — ESCITALOPRAM OXALATE 10 MG PO TABS
10.0000 mg | ORAL_TABLET | Freq: Every day | ORAL | Status: DC
  Administered 2024-03-11: 10 mg via ORAL
  Filled 2024-03-10: qty 1

## 2024-03-10 MED ORDER — SENNA 8.6 MG PO TABS: 1.0000 | ORAL_TABLET | Freq: Every evening | ORAL | Status: DC | PRN

## 2024-03-10 NOTE — ED Notes (Signed)
 Attempted to draw labs, pat advised she wanted to finish eating before getting blood drawn

## 2024-03-10 NOTE — ED Notes (Addendum)
 Pt incontinent of urine, cleaned pt and changed brief

## 2024-03-10 NOTE — ED Notes (Signed)
 RN waiting on pharmacy to verify medications

## 2024-03-10 NOTE — ED Triage Notes (Addendum)
 Pt bib EMS from home. 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 Her neighbor has been taking care of her since then and the neighbor did not come in to take care of pt today. Pt has been sitting in feces and urine. She only had access to pop tarts. She says she has been unable to get to meds that were in the other room. SI is endorsed with a plan to drink Pinesol because of her situation. She has a hx of CVA with L sided paralysis. She is unable to ambulate. Pt bathed upon arrival to ED and changed.

## 2024-03-10 NOTE — ED Notes (Signed)
 Pt came in covered in feces and urine.  RN and this Clinical research associate cleaned up pt and put a new brief on pt along with barrier cream in all creases.

## 2024-03-10 NOTE — ED Provider Notes (Signed)
 [place] EM[facility]ENC[facility] DEPARTMENT AT [facility] Provider Note  CSN: [id number] Arrival date & time: 03/10/24 1736  Chief Complaint(s) Suicidal and social work  HPI Sheri Beck is a 58 y.o. female who is here today because she tells me that she is suicidal.  Patient reports that she is going to drink Pine-Sol to kill herself.  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  Patient requires home health aides due to a prior CVA with residual left-sided weakness.   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   Major depressive disorder, single episode, 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   Adjustment disorder with depressed mood 10/21/2022   Delirium due to multiple etiologies, acute, hypoactive 10/21/2022   Tobacco use 10/21/2022   Cocaine dependence (HCC) 10/21/2022   Episodic cannabis use 10/21/2022   Snoring 10/21/2022   Alcohol use 10/21/2022   Stroke (HCC) 10/16/2022   Stroke (cerebrum) (HCC) 10/16/2022   Hyperlipidemia 09/28/2022   Severe obesity (BMI 35.0-39.9) with comorbidity (HCC) 09/28/2022   Acute bacterial conjunctivitis of right eye 05/06/2020   Dental abscess 05/06/2020   Essential hypertension 05/06/2020   Home Medication(s) Prior to Admission medications   Medication Sig Start Date End Date Taking? Authorizing Provider  amLODipine  (NORVASC ) 10 MG tablet TAKE 1/2 TABLET(5 MG) BY  MOUTH DAILY. NEED APPOINTMENT FOR REFILLS. 09/06/23   Aida House, MD  apixaban  (ELIQUIS ) 5 MG TABS tablet Take 1 tablet (5 mg total) by mouth 2 (two) times daily. 11/01/23   Aida House, MD  busPIRone  (BUSPAR ) 7.5 MG tablet Take 7.5 mg by mouth 2 (two) times daily.    [provider]  Cholecalciferol (VITAMIN D3) 1.25 MG (50000 UT) CAPS Take 1 capsule (50,000 Units total) by mouth every 7 (seven) days. 03/08/23   Aida House, MD  diclofenac  Sodium (VOLTAREN ) 1 % GEL Apply 2 g topically 4 (four) times daily as needed (apply to areas of pain and inflammation.). 02/02/23   Aida House, MD  docusate sodium  (COLACE) 100 MG capsule Take 100 mg by mouth 2 (two) times daily.    [provider]  escitalopram  (LEXAPRO ) 10 MG tablet Take 1 tablet (10 mg total) by mouth daily. 11/01/23   Aida House, MD  gabapentin  (NEURONTIN ) 400 MG capsule TAKE 1 CAPSULE(400 MG) BY MOUTH THREE TIMES DAILY 01/31/24   Aida House, MD  hydrOXYzine  (ATARAX ) 25 MG tablet TAKE 1 TABLET(25 MG) BY MOUTH EVERY 8 HOURS AS NEEDED 12/26/23   Aida House, MD  levETIRAcetam  (KEPPRA ) 500 MG tablet Take 1 tablet (500 mg total) by mouth 2 (two) times daily. 11/18/23   Davis, Jonathon H, MD  lisinopril -hydrochlorothiazide  (ZESTORETIC ) 20-12.5 MG tablet TAKE 1 TABLET BY MOUTH DAILY 09/09/23   Aida House, MD  nystatin  (MYCOSTATIN /NYSTOP ) powder Apply 1 Application topically 2 (two) times daily as needed. 11/01/23   Aida House, MD  potassium chloride  SA (KLOR-CON  M)  20 MEQ tablet TAKE 1 TABLET(20 MEQ) BY MOUTH DAILY 02/29/24   Aida House, MD  promethazine  (PHENERGAN ) 12.5 MG tablet Take 1 tablet (12.5 mg total) by mouth every 8 (eight) hours as needed for nausea or vomiting. 03/08/23   Aida House, MD  senna (SENOKOT) 8.6 MG tablet Take 1-2 tablets by mouth at bedtime as needed for constipation.    [provider]                                                                                                                                     Past Surgical History Past Surgical History:  Procedure Laterality Date   CRANIOTOMY Right 10/16/2022   Procedure: RIGHT CRANIECTOMY;  Surgeon: Garry Kansas, MD;  Location: [facility] OR;  Service: Neurosurgery;  Laterality: Right;   Family History Family History  Problem Relation Age of Onset   Cancer Mother    Heart disease Father    Glaucoma Father    Varicose Veins Paternal Grandfather    Breast cancer Neg Hx     Social History Social History   Tobacco Use   Smoking status: Former    Current packs/day: 0.00    Types: Cigarettes    Quit date: 10/14/2022    Years since quitting: 1.4    Passive exposure: Past   Smokeless tobacco: Never  Vaping Use   Vaping status: Never Used  Substance Use Topics   Alcohol use: Not Currently    Comment: occasional   Drug use: Not Currently    Types: Cocaine    Comment: Pt states last use was 10/15/22   Allergies Patient has no known allergies.  Review of Systems Review of Systems  Physical Exam Vital Signs  I have reviewed the triage vital signs BP (!) 142/90   Pulse 78   Temp 98.3 F (36.8 C)   Resp 18   Ht 5\' 2"  (1.575 m)   Wt 83 kg   SpO2 99%   BMI 33.47 kg/m   Physical Exam Vitals and nursing note reviewed.  Cardiovascular:     Rate and Rhythm: Normal rate.  Pulmonary:     Effort: Pulmonary effort is normal.  Abdominal:     General: Abdomen is flat.     Palpations: Abdomen is soft.  Musculoskeletal:        General: Normal range of motion.  Skin:    General: Skin is warm.  Neurological:     Mental Status: She is alert and oriented to person, place, and time.  Psychiatric:     Comments: Patient endorses suicidal ideation     ED Results and Treatments Labs (all labs ordered are listed, but only abnormal results are displayed) Labs Reviewed  COMPREHENSIVE METABOLIC PANEL WITH GFR - Abnormal; Notable for the following  components:      Result Value   Glucose, Bld 104 (*)    All other components within  normal limits  CBC WITH DIFFERENTIAL/PLATELET - Abnormal; Notable for the following components:   RBC 5.31 (*)    Hemoglobin 15.2 (*)    HCT 47.1 (*)    All other components within normal limits  ETHANOL  RAPID URINE DRUG SCREEN, HOSP PERFORMED                                                                                                                          Radiology No results found.  Pertinent labs & imaging results that were available during my care of the patient were reviewed by me and considered in my medical decision making (see MDM for details).  Medications Ordered in ED Medications  amLODipine  (NORVASC ) tablet 5 mg (has no administration in time range)  apixaban  (ELIQUIS ) tablet 5 mg (has no administration in time range)  busPIRone  (BUSPAR ) tablet 7.5 mg (has no administration in time range)  docusate sodium  (COLACE) capsule 100 mg (has no administration in time range)  escitalopram  (LEXAPRO ) tablet 10 mg (has no administration in time range)  gabapentin  (NEURONTIN ) capsule 400 mg (has no administration in time range)  levETIRAcetam  (KEPPRA ) tablet 500 mg (has no administration in time range)  lisinopril -hydrochlorothiazide  (ZESTORETIC ) 20-12.5 MG per tablet 1 tablet (has no administration in time range)  promethazine  (PHENERGAN ) tablet 12.5 mg (has no administration in time range)  senna (SENOKOT) tablet 8.6-17.2 mg (has no administration in time range)                                                                                                                                     Procedures Procedures  (including critical care time)  Medical Decision Making / ED Course   This patient presents to the ED for concern of suicidal ideation, this involves an extensive number of treatment options, and is a complaint that carries with it a high risk of complications and morbidity.   The differential diagnosis includes suicidality, secondary gain.  MDM: Patient is reporting suicidal ideation, stating she is going to ingest household cleaners.  She denies any ingestion at this time.  I do believe that there is an element of this being her method of obtaining did additional home health care support.  Will medically clear the patient.  She has no wounds on the abdomen or buttocks.  Reassessment 10 PM-patient medically cleared.  Will place psychiatry consult.  Additional history obtained: -  Additional history obtained from EMS -External records from outside source obtained and reviewed including: Chart review including previous notes, labs, imaging, consultation notes   Lab Tests: -I ordered, reviewed, and interpreted labs.   The pertinent results include:   Labs Reviewed  COMPREHENSIVE METABOLIC PANEL WITH GFR - Abnormal; Notable for the following components:      Result Value   Glucose, Bld 104 (*)    All other components within normal limits  CBC WITH DIFFERENTIAL/PLATELET - Abnormal; Notable for the following components:   RBC 5.31 (*)    Hemoglobin 15.2 (*)    HCT 47.1 (*)    All other components within normal limits  ETHANOL  RAPID URINE DRUG SCREEN, HOSP PERFORMED      EKG my independent review of the patient's EKG shows no ST segment depressions or elevations, no T wave inversions, no evidence of acute ischemia.  EKG Interpretation Date/Time:  Saturday March 10 2024 20:29:19 EDT Ventricular Rate:  76 PR Interval:  157 QRS Duration:  121 QT Interval:  393 QTC Calculation: 442 R Axis:   15  Text Interpretation: Sinus rhythm Nonspecific intraventricular conduction delay Borderline T abnormalities, anterior leads Confirmed by Afton Horse [phone]) on 03/10/2024 10:00:13 PM       Medicines ordered and prescription drug management: Meds ordered this encounter  Medications   amLODipine  (NORVASC ) tablet 5 mg   apixaban  (ELIQUIS ) tablet 5 mg    busPIRone  (BUSPAR ) tablet 7.5 mg   docusate sodium  (COLACE) capsule 100 mg   escitalopram  (LEXAPRO ) tablet 10 mg   gabapentin  (NEURONTIN ) capsule 400 mg   levETIRAcetam  (KEPPRA ) tablet 500 mg   lisinopril -hydrochlorothiazide  (ZESTORETIC ) 20-12.5 MG per tablet 1 tablet   promethazine  (PHENERGAN ) tablet 12.5 mg   senna (SENOKOT) tablet 8.6-17.2 mg    -I have reviewed the patients home medicines and have made adjustments as needed   Cardiac Monitoring: The patient was maintained on a cardiac monitor.  I personally viewed and interpreted the cardiac monitored which showed an underlying rhythm of: Normal sinus rhythm  Social Determinants of Health:  Factors impacting patients care include: Lack of access to primary care   Reevaluation: After the interventions noted above, I reevaluated the patient and found that they have :improved  Co morbidities that complicate the patient evaluation  Past Medical History:  Diagnosis Date   Hypertension    Stroke [facility]) 10/2022      Final Clinical Impression(s) / ED Diagnoses Final diagnoses:  Suicidal ideation     @PCDICTATION @    Afton Horse T, DO 03/10/24 2216

## 2024-03-11 MED ORDER — GABAPENTIN 400 MG PO CAPS
400.0000 mg | ORAL_CAPSULE | Freq: Three times a day (TID) | ORAL | Status: DC
  Administered 2024-03-11: 400 mg via ORAL
  Filled 2024-03-11: qty 1

## 2024-03-11 NOTE — BH Assessment (Signed)
 Comprehensive Clinical Assessment (CCA) Note  03/11/2024 Sheri Beck [id number]  Chief Complaint:  Chief Complaint  Patient presents with   Suicidal   social work  Disposition: Per Angelica Kemp Bobbitt patient is recommended for outpatient follow-up.   The patient demonstrates the following risk factors for suicide: Chronic risk factors for suicide include: psychiatric disorder of MDD. Acute risk factors for suicide include: N/A. Protective factors for this patient include: positive social support and hope for the future. Considering these factors, the overall suicide risk at this point appears to be moderate. Patient is appropriate for outpatient follow up.  Sheri Beck is a 58 year old female who presents to [facility] due to SI. Patient states she called the police and reported SI with a plan to drink [facility]. Patient states she had no intentions on drinking [facility] and only said this because she needed assistance with her ADL's. She states she fired her previous caregiver because she was using her; coming over to wash clothes, and use other amenities. She states her daughter usually helps her but she is 9 months pregnant and due any day so she can't help her at this time and she states she asked her neighbor to help her but she refused to help her. She reports she had defecated on herself and was sitting in feces for a long period of time. She states she didn't have anyone else to help her so she called EMS and told them she was suicidal so that she could go to the hospital to get cleaned up. She states she does not want to harm herself. Patient reported she had ulterior motives. She denies past suicide attempts. She states she contacted her friend that lives in [place]  Baker Bon that is going to come to help her until she is able to get proper aide. She verbally contracts for safety. She denies HI and AVH. She denies access to weapons. She denies current legal concerns. She denies history of trauma or  abuse.  Patient gives permission to speak with her daughter Lona Rist [phone]) and her friend Elita Guitar [phone]. Per her daughter Dory Gaw, the patient has a history of calling EMS or police when she does not get her way. She states she becomes upset when people don't do things on her time and she wants someone to sit with her 24 hours a day not just a few hours throughout the day. She states she had an unpaid aide that was coming multiple times a day to provide meals, baths, and other assistance but she states her mother became upset when the aide was unable to stay with her all day. She states the aide recently got a paid job and is still coming daily to help her but cannot stay as long as she was before. She states she is working with social services to get her a paid aide to help her but the process takes time. She denies any safety concerns with her mother returning home. She denies any history of past suicide attempts to her knowledge. Dory Gaw reports her friend Baker Bon is actually her ex-boyfriend who used to live with her for a period of time that she still keeps in touch with. Shamaka provided the name of the patients aide Dorethia Ganser [phone], to provide additional information if needed.   Per her friend Elita Guitar, the patient did contact her and let him know that she is was in the hospital and would need assistance, He states he lives in [place]  and was going  to come down to visit her but he has not made it to [place] yet. He denies any safety concerns with her discharging from the hospital.      Visit Diagnosis:  Suicidal Ideation    CCA Screening, Triage and Referral (STR)  Patient Reported Information How did you hear about us ? Family/Friend  What Is the Reason for Your Visit/Call Today? Sheri Beck is a 58 year old female who presents to [facility] due to SI. Patient states she called the police and reported SI with a plan to drink [facility]. Patient states she had no  intentions on drinking [facility] and only said this because she needed assistance with her ADL's. She states she fired her previous caregiver because she was using her; coming over to wash clothes, and use other amenities. She states her daughter usually helps her but she is 9 months pregnant and due any day so she can't help her at this time and she states she asked her neighbor to help her but she refused to help her. She reports she had defecated on herself and was sitting in feces for a long period of time. She states she didn't have anyone else to help her so she called EMS and told them she was suicidal so that she could go to the hospital to get cleaned up. She states she does not want to harm herself. Patient reported she had ulterior motives. She denies past suicide attempts. She states she contacted her friend that lives in [place]  Baker Bon that is going to come to help her until she is able to get proper aide. She verbally contracts for safety. She denies HI and AVH.  How Long Has This Been Causing You Problems? <Week  What Do You Feel Would Help You the Most Today? Stress Management; Social Support   Have You Recently Had Any Thoughts About Hurting Yourself? Yes (reported SI but states she said this for ulterior motives.)  Are You Planning to Commit Suicide/Harm Yourself At This time? No   Flowsheet Row ED from 03/10/2024 in [facility] Department at [facility] ED from 02/29/2024 in [facility] Emergency Department at [facility] ED from 11/18/2023 in [facility] Emergency Department at [facility]  C-SSRS RISK CATEGORY Moderate Risk No Risk No Risk       Have you Recently Had Thoughts About Hurting Someone Marigene Shoulder? No  Are You Planning to Harm Someone at This Time? No  Explanation: denies HI   Have You Used Any Alcohol or Drugs in the Past 24 Hours? No  How Long Ago Did You Use Drugs or Alcohol? N/a What Did You Use and How Much? N/a  Do You  Currently Have a Therapist/Psychiatrist? No  Name of Therapist/Psychiatrist:    Have You Been Recently Discharged From Any Office Practice or Programs? No  Explanation of Discharge From Practice/Program: n/a    CCA Screening Triage Referral Assessment Type of Contact: Tele-Assessment  Telemedicine Service Delivery: Telemedicine service delivery: This service was provided via telemedicine using a 2-way, interactive audio and video technology  Is this Initial or Reassessment? Is this Initial or Reassessment?: Initial Assessment  Date Telepsych consult ordered in [facility]:  Date Telepsych consult ordered in [facility]: 03/10/24  Time Telepsych consult ordered in [facility]:  Time Telepsych consult ordered in [facility]: 2255  Location of Assessment: [facility] ED  Provider Location: [facility] Assessment Services   Collateral Involvement: Lona Rist (Daughter)  [phone]   Does Patient Have a Court  Appointed Legal Guardian? No  Legal Guardian Contact Information: n/a  Copy of Legal Guardianship Form: -- (n/a)  Legal Guardian Notified of Arrival: -- (n/a)  Legal Guardian Notified of Pending Discharge: -- (n/a)  If Minor and Not Living with Parent(s), Who has Custody? n/a  Is CPS involved or ever been involved? Never  Is APS involved or ever been involved? Never   Patient Determined To Be At Risk for Harm To Self or Others Based on Review of Patient Reported Information or Presenting Complaint? Yes, for Self-Harm  Method: Plan without intent  Availability of Means: No access or NA  Intent: Vague intent or NA  Notification Required: No need or identified person  Additional Information for Danger to Others Potential: -- (n/a)  Additional Comments for Danger to Others Potential: n/a  Are There Guns or Other Weapons in Your Home? No  Types of Guns/Weapons: denies access to weapons  Are These Weapons Safely Secured?                            -- (denies access to weapons)  Who Could Verify  You Are Able To Have These Secured: denies access to weapons  Do You Have any Outstanding Charges, Pending Court Dates, Parole/Probation? denies  Contacted To Inform of Risk of Harm To Self or Others: Law Enforcement    Does Patient Present under Involuntary Commitment? No    [place] of Residence: [place]   Patient Currently Receiving the Following Services: Not Receiving Services   Determination of Need: Urgent (48 hours)   Options For Referral: Medication Management; Outpatient Therapy; ALF/SNF     CCA Biopsychosocial Patient Reported Schizophrenia/Schizoaffective Diagnosis in Past: No   Strengths: Cooperation in assessment. Honesty   Mental Health Symptoms Depression:  Irritability   Duration of Depressive symptoms: Duration of Depressive Symptoms: Less than two weeks   Mania:  N/A   Anxiety:   N/A   Psychosis:  None   Duration of Psychotic symptoms:    Trauma:  N/A   Obsessions:  N/A   Compulsions:  N/A   Inattention:  N/A   Hyperactivity/Impulsivity:  N/A   Oppositional/Defiant Behaviors:  N/A   Emotional Irregularity:  N/A   Other Mood/Personality Symptoms:  reports making statement about SI but denies intentions reporting ulterior motives.    Mental Status Exam Appearance and self-care  Stature:  Average   Weight:  Average weight   Clothing:  -- (scrubs)   Grooming:  Normal   Cosmetic use:  None   Posture/gait:  Other (Comment) (lying in hospital bed)   Motor activity:  Not Remarkable   Sensorium  Attention:  Normal   Concentration:  Normal   Orientation:  X5   Recall/memory:  Normal   Affect and Mood  Affect:  Appropriate   Mood:  Euthymic   Relating  Eye contact:  Normal   Facial expression:  Responsive   Attitude toward examiner:  Cooperative   Thought and Language  Speech flow: Clear and Coherent   Thought content:  Appropriate to Mood and Circumstances   Preoccupation:  None   Hallucinations:  None    Organization:  Goal-directed   [employer] of Knowledge:  Fair   Intelligence:  Average   Abstraction:  Normal   Judgement:  Impaired   Reality Testing:  Realistic   Insight:  Fair   Decision Making:  Impulsive   Social Functioning  Social Maturity:  Impulsive  Social Judgement:  Chemical engineer"   Stress  Stressors:  Other (Comment) (Had a stroke, needs an aide)   Coping Ability:  Overwhelmed   Skill Deficits:  Self-care   Supports:  Support needed; Family     Religion: Religion/Spirituality Are You A Religious Person?: Yes What is Your Religious Affiliation?: Baptist How Might This Affect Treatment?: n/a  Leisure/Recreation: Leisure / Recreation Do You Have Hobbies?: Yes Leisure and Hobbies: cooking prior to her stroke  Exercise/Diet: Exercise/Diet Do You Exercise?: No Have You Gained or Lost A Significant Amount of Weight in the Past Six Months?: No Do You Follow a Special Diet?: No Do You Have Any Trouble Sleeping?: No   CCA Employment/Education Employment/Work Situation: Employment / Work Situation Employment Situation: Unemployed Patient's Job has Been Impacted by Current Illness: No Has Patient ever Been in Equities trader?: No  Education: Education Is Patient Currently Attending School?: No Last Grade Completed: 9 Did You Product manager?: No Did You Have An Individualized Education Program (IIEP): No Did You Have Any Difficulty At [employer]?: No Patient's Education Has Been Impacted by Current Illness: No   CCA Family/Childhood History Family and Relationship History: Family history Marital status: Single Does patient have children?: Yes How many children?: 1 (Did not disclose if she has more children.) How is patient's relationship with their children?: strained relationship with daughter  Childhood History:  Childhood History By whom was/is the patient raised?: Other (Comment) (UTA) Did patient suffer any  verbal/emotional/physical/sexual abuse as a child?: No Did patient suffer from severe childhood neglect?: No Has patient ever been sexually abused/assaulted/raped as an adolescent or adult?: No Was the patient ever a victim of a crime or a disaster?: No Witnessed domestic violence?: No Has patient been affected by domestic violence as an adult?: No       CCA Substance Use Alcohol/Drug Use: Alcohol / Drug Use Pain Medications: See MAR Prescriptions: See MAR Over the Counter: See MAR History of alcohol / drug use?: Yes Longest period of sobriety (when/how long): Reports using cocaine 2 months ago, denies any other substance use, Denies any recent use.                         ASAM's:  Six Dimensions of Multidimensional Assessment  Dimension 1:  Acute Intoxication and/or Withdrawal Potential:      Dimension 2:  Biomedical Conditions and Complications:      Dimension 3:  Emotional, Behavioral, or Cognitive Conditions and Complications:     Dimension 4:  Readiness to Change:     Dimension 5:  Relapse, Continued use, or Continued Problem Potential:     Dimension 6:  Recovery/Living Environment:     ASAM Severity Score:    ASAM Recommended Level of Treatment:     Substance use Disorder (SUD)    Recommendations for Services/Supports/Treatments:    Disposition Recommendation per psychiatric provider: There are no psychiatric contraindications to discharge at this time   DSM5 Diagnoses: 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   Major depressive  disorder, single episode, 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   Adjustment  disorder with depressed mood 10/21/2022   Delirium due to multiple etiologies, acute, hypoactive 10/21/2022   Tobacco use 10/21/2022   Cocaine dependence (HCC) 10/21/2022   Episodic cannabis use 10/21/2022   Snoring 10/21/2022   Alcohol use 10/21/2022   Stroke (HCC) 10/16/2022   Stroke (cerebrum) (HCC) 10/16/2022   Hyperlipidemia 09/28/2022   Severe obesity (BMI 35.0-39.9) with comorbidity (HCC) 09/28/2022   Acute bacterial conjunctivitis of right eye 05/06/2020   Dental abscess 05/06/2020   Essential hypertension 05/06/2020     Referrals to Alternative Service(s): Referred to Alternative Service(s):   Place:   Date:   Time:    Referred to Alternative Service(s):   Place:   Date:   Time:    Referred to Alternative Service(s):   Place:   Date:   Time:    Referred to Alternative Service(s):   Place:   Date:   Time:     Efstathios Sawin C Natashia Roseman, LCMHCA

## 2024-03-11 NOTE — ED Provider Notes (Signed)
 Emergency Medicine Observation Re-evaluation Note  Sheri Beck is a 58 y.o. female, seen on rounds today.  Pt initially presented to the ED for complaints of Suicidal and social work Currently, the patient is in her bed and has no acute complaints.  Physical Exam  BP 110/70 (BP Location: Right Arm)   Pulse 77   Temp 97.7 F (36.5 C) (Oral)   Resp 17   Ht 1.575 m (5\' 2" )   Wt 83 kg   SpO2 100%   BMI 33.47 kg/m  Physical Exam   ED Course / MDM  EKG:EKG Interpretation Date/Time:  Saturday March 10 2024 20:29:19 EDT Ventricular Rate:  76 PR Interval:  157 QRS Duration:  121 QT Interval:  393 QTC Calculation: 442 R Axis:   15  Text Interpretation: Sinus rhythm Nonspecific intraventricular conduction delay Borderline T abnormalities, anterior leads Confirmed by Afton Horse [phone]) on 03/10/2024 10:00:13 PM  I have reviewed the labs performed to date as well as medications administered while in observation.  Recent changes in the last 24 hours include patient to be sent back home and she has help already arranged.  Plan  Current plan is for discharge home.    Lind Repine, MD 03/11/24 [phone]

## 2024-03-11 NOTE — Progress Notes (Signed)
 TOC CSW received a consult for transportation assistance. CSW spoke with the pt's neighbor, Ms. Dorethia Ganser, who stated that she has been helping the pt since the pt was discharged from aide services through [facility]. She reported that the pt's previous aide was not helpful and mentioned that the aide was scheduled to come to the home five times a week for four hours but was inconsistent.  Ms. Dorethia Ganser shared that she has been assisting the pt a few times throughout the day, including bringing food and helping with personal care such as changing. She stated that the pt's daughter has not been very helpful. Ms. Dorethia Ganser explained that she now works full-time, her children are out of school, and she is unable to assist the pt as much as before. She also reported that APS was called two weeks ago and is now involved in the case. Ms. Dorethia Ganser stated she is unable to help the pt today, as she is not home, and suggested contacting the pt's daughter.  CSW spoke with the pt's daughter, Leopoldo Rancher, who reported that she is working on securing a new caregiver for the pt. She stated that the previous caregiver discontinued services due to issues with payment. She confirmed that she will be at the home to receive the pt upon discharge but noted that she is unable to lift the pt as she is currently nine months pregnant. CSW explained that EMS will assist with placing the pt  in the home. Pt's daughter requested a call when EMS arrives to pick up from the hospital, so she can meet them at the house. RN notified. TOC sign-off.  Arta Lark.Breanne Olvera, MSW, LCSW [facility] Maryan Smalling  Transitions of Care Clinical Social Worker II Direct Dial: [phone]  Fax: [phone] [email or web address]

## 2024-03-11 NOTE — Consult Note (Cosign Needed Addendum)
 Spoke to caregiver, Ms. Dorethia Ganser, at [phone], confirms no safety concerns for discharge, able to safety plan, and states she will follow-up this afternoon with patient, upon pending discharge from ED. call additionally placed to the patient's daughter, who affirms no safety concerns with discharge.  Care giver additionally notes Mr. Elita Guitar is coming to help care for patient, and she is in contact with him to facilitate care measures and safety.   Patient is aware of recommendations for strict adherence to safety plan created with behavioral health team, close outpatient follow-up with medication management and therapy services for mental health, and for continuing to take current prescribed medication regimen.  Resources added to discharge instructions

## 2024-03-11 NOTE — ED Notes (Signed)
 Patient asleep at this time, sitter at bedside

## 2024-03-12 ENCOUNTER — Ambulatory Visit: Payer: Self-pay

## 2024-03-12 NOTE — Telephone Encounter (Signed)
 FYI Only or Action Required?: FYI only for provider  Patient was last seen in primary care on 11/18/2023 by Viola Greulich, MD. Called Nurse Triage reporting Migraine. Symptoms began several weeks ago. Interventions attempted: Nothing. Symptoms are: unchanged.  Triage Disposition: See PCP When Office is Open (Within 3 Days)  Patient/caregiver understands and will follow disposition?:    Copied from CRM [id number]. 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 Patient daughter stated the piece of her skull is inside of her stomach Reason for Disposition  [1] MILD-MODERATE headache AND [2] present > 72 hours  Answer Assessment - Initial Assessment Questions 1. LOCATION: "Where does it hurt?"      Right side of head, "where she had surgery", but daughter unsure of exact location  2. ONSET: "When did the headache start?" (Minutes, hours or days)      Started almost 2 weeks ago   3. PATTERN: "Does the pain come and go, or has it been constant since it started?"     Pain seems to come and go  4. SEVERITY: "How bad is the pain?" and "What does it keep you from doing?"  (e.g., Scale 1-10; mild, moderate, or severe)   - MILD (1-3): doesn't interfere with normal activities    - MODERATE (4-7): interferes with normal activities or awakens from sleep    - SEVERE (8-10): excruciating pain, unable to do any normal activities        Mild to moderate  5. RECURRENT SYMPTOM: "Have you ever had headaches before?" If Yes, ask: "When was the last time?" and "What happened that time?"      Yes, has history of bad headache. Had a stroke in January 2024, and had a right hemicraniectomy  6. CAUSE: "What do you think is causing the headache?"     Unsure of exact cause   7. MIGRAINE: "Have you been diagnosed with migraine  headaches?" If Yes, ask: "Is this headache similar?"      Unsue  8. HEAD INJURY: "Has there been any recent injury to the head?"      Unsure of any new injuries  9. OTHER SYMPTOMS: "Do you have any other symptoms?" (fever, stiff neck, eye pain, sore throat, cold symptoms)     No  10. PREGNANCY: "Is there any chance you are pregnant?" "When was your last menstrual period?"       No   Daughter requesting to hold off on scheduling at this time, says she has to arranged transportation because she is bedbound. This RN did advise that patient should be seen within 3 days. Daughter Ananias Karma says that she will also follow-up with Neurology because patient hasn't seen them since February 2024, and she is not sure what the plan was supposed to be with Neurosurgery.  Protocols used: [facility]

## 2024-03-12 NOTE — Telephone Encounter (Signed)
 Noted- ok to close.

## 2024-03-19 ENCOUNTER — Emergency Department (HOSPITAL_COMMUNITY): Payer: MEDICAID

## 2024-03-19 ENCOUNTER — Encounter (HOSPITAL_COMMUNITY): Payer: Self-pay

## 2024-03-19 ENCOUNTER — Other Ambulatory Visit: Payer: Self-pay

## 2024-03-19 ENCOUNTER — Inpatient Hospital Stay (HOSPITAL_COMMUNITY)
Admission: EM | Admit: 2024-03-19 | Discharge: 2024-03-27 | DRG: 315 | Disposition: A | Discharge status: Skilled Nursing Facility | Payer: MEDICAID | Attending: Internal Medicine | Admitting: Internal Medicine

## 2024-03-19 ENCOUNTER — Emergency Department (HOSPITAL_COMMUNITY)
Admission: EM | Admit: 2024-03-19 | Discharge: 2024-03-19 | Disposition: A | Discharge status: Home / Self Care | Payer: MEDICAID | Attending: Emergency Medicine | Admitting: Emergency Medicine

## 2024-03-19 DIAGNOSIS — Z7901 Long term (current) use of anticoagulants: Secondary | ICD-10-CM

## 2024-03-19 DIAGNOSIS — R569 Unspecified convulsions: Secondary | ICD-10-CM | POA: Diagnosis present

## 2024-03-19 DIAGNOSIS — Z79899 Other long term (current) drug therapy: Secondary | ICD-10-CM | POA: Diagnosis not present

## 2024-03-19 DIAGNOSIS — I10 Essential (primary) hypertension: Secondary | ICD-10-CM | POA: Diagnosis not present

## 2024-03-19 DIAGNOSIS — R079 Chest pain, unspecified: Secondary | ICD-10-CM | POA: Insufficient documentation

## 2024-03-19 DIAGNOSIS — I9589 Other hypotension: Principal | ICD-10-CM | POA: Diagnosis present

## 2024-03-19 DIAGNOSIS — I69354 Hemiplegia and hemiparesis following cerebral infarction affecting left non-dominant side: Secondary | ICD-10-CM

## 2024-03-19 DIAGNOSIS — I959 Hypotension, unspecified: Secondary | ICD-10-CM

## 2024-03-19 DIAGNOSIS — I6930 Unspecified sequelae of cerebral infarction: Secondary | ICD-10-CM

## 2024-03-19 DIAGNOSIS — Z83511 Family history of glaucoma: Secondary | ICD-10-CM

## 2024-03-19 DIAGNOSIS — F411 Generalized anxiety disorder: Secondary | ICD-10-CM | POA: Diagnosis present

## 2024-03-19 DIAGNOSIS — Z86718 Personal history of other venous thrombosis and embolism: Secondary | ICD-10-CM

## 2024-03-19 DIAGNOSIS — Z8249 Family history of ischemic heart disease and other diseases of the circulatory system: Secondary | ICD-10-CM

## 2024-03-19 DIAGNOSIS — R42 Dizziness and giddiness: Principal | ICD-10-CM

## 2024-03-19 DIAGNOSIS — I639 Cerebral infarction, unspecified: Secondary | ICD-10-CM | POA: Diagnosis present

## 2024-03-19 DIAGNOSIS — E785 Hyperlipidemia, unspecified: Secondary | ICD-10-CM | POA: Diagnosis present

## 2024-03-19 DIAGNOSIS — Z87891 Personal history of nicotine dependence: Secondary | ICD-10-CM

## 2024-03-19 LAB — BASIC METABOLIC PANEL WITH GFR
Anion gap: 8 (ref 5–15)
BUN: 19 mg/dL (ref 6–20)
CO2: 25 mmol/L (ref 22–32)
Calcium: 9.7 mg/dL (ref 8.9–10.3)
Chloride: 108 mmol/L (ref 98–111)
Creatinine, Ser: 0.67 mg/dL (ref 0.44–1.00)
GFR, Estimated: >60 mL/min (ref >60)
Glucose, Bld: 98 mg/dL (ref 70–99)
Potassium: 4.1 mmol/L (ref 3.5–5.1)
Sodium: 141 mmol/L (ref 135–145)

## 2024-03-19 LAB — CBC WITH DIFFERENTIAL/PLATELET
Abs Immature Granulocytes: 0.01 10*3/uL (ref 0.00–0.07)
Basophils Absolute: 0.1 10*3/uL (ref 0.0–0.1)
Basophils Relative: 1 %
Eosinophils Absolute: 0.4 10*3/uL (ref 0.0–0.5)
Eosinophils Relative: 6 %
HCT: 45.4 % (ref 36.0–46.0)
Hemoglobin: 14.6 g/dL (ref 12.0–15.0)
Immature Granulocytes: 0 %
Lymphocytes Relative: 59 %
Lymphs Abs: 4 10*3/uL (ref 0.7–4.0)
MCH: 29.1 pg (ref 26.0–34.0)
MCHC: 32.2 g/dL (ref 30.0–36.0)
MCV: 90.4 fL (ref 80.0–100.0)
Monocytes Absolute: 0.5 10*3/uL (ref 0.1–1.0)
Monocytes Relative: 7 %
Neutro Abs: 1.8 10*3/uL (ref 1.7–7.7)
Neutrophils Relative %: 27 %
Platelets: 266 10*3/uL (ref 150–400)
RBC: 5.02 MIL/uL (ref 3.87–5.11)
RDW: 13.7 % (ref 11.5–15.5)
WBC: 6.8 10*3/uL (ref 4.0–10.5)
nRBC: 0 % (ref 0.0–0.2)

## 2024-03-19 LAB — TROPONIN I (HIGH SENSITIVITY)
Troponin I (High Sensitivity): 2 ng/L (ref <18)
Troponin I (High Sensitivity): <2 ng/L (ref <18)

## 2024-03-19 MED ORDER — ACETAMINOPHEN 325 MG PO TABS
650.0000 mg | ORAL_TABLET | ORAL | Status: DC | PRN
  Administered 2024-03-21 – 2024-03-26 (×3): 650 mg via ORAL
  Filled 2024-03-19 (×3): qty 2

## 2024-03-19 MED ORDER — LISINOPRIL 20 MG PO TABS
20.0000 mg | ORAL_TABLET | Freq: Every day | ORAL | Status: DC
  Administered 2024-03-20: 20 mg via ORAL
  Filled 2024-03-19 (×2): qty 1

## 2024-03-19 MED ORDER — LEVETIRACETAM 500 MG PO TABS
500.0000 mg | ORAL_TABLET | Freq: Two times a day (BID) | ORAL | Status: DC
  Administered 2024-03-19 – 2024-03-27 (×16): 500 mg via ORAL
  Filled 2024-03-19 (×7): qty 1
  Filled 2024-03-19: qty 2
  Filled 2024-03-19 (×8): qty 1

## 2024-03-19 MED ORDER — BUSPIRONE HCL 5 MG PO TABS
7.5000 mg | ORAL_TABLET | Freq: Every day | ORAL | Status: DC
  Administered 2024-03-20 – 2024-03-27 (×8): 7.5 mg via ORAL
  Filled 2024-03-19 (×2): qty 2
  Filled 2024-03-19 (×3): qty 1
  Filled 2024-03-19: qty 2
  Filled 2024-03-19 (×3): qty 1
  Filled 2024-03-19: qty 2

## 2024-03-19 MED ORDER — ACETAMINOPHEN 325 MG PO TABS
650.0000 mg | ORAL_TABLET | Freq: Once | ORAL | Status: AC
  Administered 2024-03-19: 650 mg via ORAL
  Filled 2024-03-19: qty 2

## 2024-03-19 MED ORDER — DIPHENHYDRAMINE HCL 25 MG PO CAPS: 25.0000 mg | ORAL_CAPSULE | Freq: Once | ORAL | Status: DC

## 2024-03-19 MED ORDER — ZOLPIDEM TARTRATE 5 MG PO TABS
5.0000 mg | ORAL_TABLET | Freq: Every evening | ORAL | Status: DC | PRN
Start: 2024-03-19 — End: 2024-03-27
  Administered 2024-03-20 – 2024-03-25 (×3): 5 mg via ORAL
  Filled 2024-03-19 (×3): qty 1

## 2024-03-19 MED ORDER — LORAZEPAM 2 MG/ML IJ SOLN
0.5000 mg | Freq: Once | INTRAMUSCULAR | Status: AC
  Administered 2024-03-19: 0.5 mg via INTRAVENOUS
  Filled 2024-03-19: qty 1

## 2024-03-19 MED ORDER — APIXABAN 5 MG PO TABS
5.0000 mg | ORAL_TABLET | Freq: Two times a day (BID) | ORAL | Status: DC
  Administered 2024-03-19 – 2024-03-27 (×16): 5 mg via ORAL
  Filled 2024-03-19 (×16): qty 1

## 2024-03-19 MED ORDER — LISINOPRIL-HYDROCHLOROTHIAZIDE 20-12.5 MG PO TABS: 1.0000 | ORAL_TABLET | Freq: Every day | ORAL | Status: DC

## 2024-03-19 MED ORDER — LEVETIRACETAM 500 MG PO TABS: 500.0000 mg | ORAL_TABLET | Freq: Two times a day (BID) | ORAL | 3 refills | Status: AC

## 2024-03-19 MED ORDER — HYDROCHLOROTHIAZIDE 12.5 MG PO TABS
12.5000 mg | ORAL_TABLET | Freq: Every day | ORAL | Status: DC
  Administered 2024-03-20 – 2024-03-21 (×2): 12.5 mg via ORAL
  Filled 2024-03-19 (×2): qty 1

## 2024-03-19 MED ORDER — GABAPENTIN 300 MG PO CAPS
300.0000 mg | ORAL_CAPSULE | Freq: Once | ORAL | Status: AC
  Administered 2024-03-19: 300 mg via ORAL
  Filled 2024-03-19: qty 1

## 2024-03-19 MED ORDER — ESCITALOPRAM OXALATE 10 MG PO TABS
10.0000 mg | ORAL_TABLET | Freq: Every day | ORAL | Status: DC
  Administered 2024-03-20 – 2024-03-27 (×8): 10 mg via ORAL
  Filled 2024-03-19 (×8): qty 1

## 2024-03-19 MED ORDER — LEVETIRACETAM (KEPPRA) 500 MG/5 ML ADULT IV PUSH
1000.0000 mg | Freq: Once | INTRAVENOUS | Status: AC
  Administered 2024-03-19: 1000 mg via INTRAVENOUS
  Filled 2024-03-19: qty 10

## 2024-03-19 MED ORDER — VITAMIN D (ERGOCALCIFEROL) 1.25 MG (50000 UNIT) PO CAPS
50000.0000 [IU] | ORAL_CAPSULE | ORAL | Status: DC
  Administered 2024-03-21: 50000 [IU] via ORAL
  Filled 2024-03-19: qty 1

## 2024-03-19 MED ORDER — AMLODIPINE BESYLATE 5 MG PO TABS
5.0000 mg | ORAL_TABLET | Freq: Every day | ORAL | Status: DC
  Administered 2024-03-20: 5 mg via ORAL
  Filled 2024-03-19 (×2): qty 1

## 2024-03-19 NOTE — ED Provider Notes (Signed)
 [place] EMERGENCY DEPARTMENT AT [facility] Provider Note   CSN: [id number] Arrival date & time: 03/19/24  9147     Patient presents with: Seizures   Sheri Beck is a 58 y.o. female.   The history is provided by the patient.  Seizures She has history of hypertension, stroke, seizures and comes in following a seizure at home.  She states that she had been given a prescription for levetiracetam  but it had not been renewed and she is no longer taking it.  She also states that she was having some tight feeling in her chest at home which she thinks was because it was very hot.  She does endorse some dyspnea but no nausea or diaphoresis.  She no longer is having any chest discomfort.  She is requesting a dose of gabapentin  because she did not get her medication today and she states that gabapentin  helps with her pain.   Prior to Admission medications   Medication Sig Start Date End Date Taking? Authorizing Provider  amLODipine  (NORVASC ) 10 MG tablet TAKE 1/2 TABLET(5 MG) BY MOUTH DAILY. NEED APPOINTMENT FOR REFILLS. Patient taking differently: Take 5 mg by mouth daily. 09/06/23   Aida House, MD  apixaban  (ELIQUIS ) 5 MG TABS tablet Take 1 tablet (5 mg total) by mouth 2 (two) times daily. 11/01/23   Aida House, MD  busPIRone  (BUSPAR ) 7.5 MG tablet Take 7.5 mg by mouth daily at 12 noon.    [provider]  Cholecalciferol (VITAMIN D3) 1.25 MG (50000 UT) CAPS Take 1 capsule (50,000 Units total) by mouth every 7 (seven) days. Patient not taking: Reported on 03/11/2024 03/08/23   Aida House, MD  escitalopram  (LEXAPRO ) 10 MG tablet Take 1 tablet (10 mg total) by mouth daily. 11/01/23   Aida House, MD  levETIRAcetam  (KEPPRA ) 500 MG tablet Take 1 tablet (500 mg total) by mouth 2 (two) times daily. Patient not taking: Reported on 03/11/2024 11/18/23   Coleman Daughters, MD  lisinopril -hydrochlorothiazide  (ZESTORETIC ) 20-12.5 MG tablet TAKE 1 TABLET BY MOUTH  DAILY 09/09/23   Aida House, MD  potassium chloride  SA (KLOR-CON  M) 20 MEQ tablet TAKE 1 TABLET(20 MEQ) BY MOUTH DAILY Patient taking differently: Take 20 mEq by mouth daily. 02/29/24   Aida House, MD    Allergies: Patient has no known allergies.    Review of Systems  Neurological:  Positive for seizures.  All other systems reviewed and are negative.   Updated Vital Signs BP (!) 112/90   Pulse 82   Temp (!) 97.4 F (36.3 C) (Oral)   SpO2 100%   Physical Exam Vitals and nursing note reviewed.   58 year old female, resting comfortably and in no acute distress. Vital signs are normal. Oxygen saturation is 100%, which is normal. Head is normocephalic and atraumatic. PERRLA, EOMI. Oropharynx is clear. Neck is nontender and supple. Lungs are clear without rales, wheezes, or rhonchi. Chest is nontender. Heart has regular rate and rhythm without murmur. Abdomen is soft, flat, nontender. Extremities have no cyanosis or edema.  Flexion contracture noted on the left. Skin is warm and dry without rash. Neurologic: Awake and alert and oriented, left hemiparesis present.  (all labs ordered are listed, but only abnormal results are displayed) Labs Reviewed  BASIC METABOLIC PANEL WITH GFR  CBC WITH DIFFERENTIAL/PLATELET  TROPONIN I (HIGH SENSITIVITY)  TROPONIN I (HIGH SENSITIVITY)    EKG: EKG Interpretation Date/Time:  Monday March 19 2024 01:43:33 EDT Ventricular  Rate:  76 PR Interval:  165 QRS Duration:  94 QT Interval:  372 QTC Calculation: 419 R Axis:   -12  Text Interpretation: Sinus rhythm Low voltage, precordial leads Abnormal R-wave progression, early transition Left ventricular hypertrophy When compared with ECG of 03/10/2024, No significant change was found Confirmed by Alissa April ([id number]) on 03/19/2024 2:11:36 AM  Cardiac monitor shows normal sinus rhythm, per my interpretation.  Procedures   Medications Ordered in the ED - No data to display                                   Medical Decision Making Amount and/or Complexity of Data Reviewed Labs: ordered.  Risk Prescription drug management.   Seizures secondary to patient not taking anticonvulsants.  Chest pain of uncertain cause.  Differential diagnosis does include angina.  I have ordered electrocardiogram, troponin x 2.  I have ordered loading dose of levetiracetam .  I have reviewed her past records, and note ED visit on 11/18/2023 for seizure-like activity and she was discharged with a prescription for levetiracetam .  I see no follow-up visits recorded.  I have reviewed her electrocardiogram, and my interpretation is low voltage in precordial leads, unchanged from prior.  I have reviewed her laboratory test, and my interpretation is normal CBC, normal basic metabolic panel, normal troponin x 2.  I am discharging her with a prescription for levetiracetam  and referring her to neurology for follow-up.  Importance of staying on levetiracetam  until told by neurology to stop it was stressed.  Final diagnoses:  Seizure (HCC)  Nonspecific chest pain    ED Discharge Orders     None          Alissa April, MD 03/19/24 [phone]

## 2024-03-19 NOTE — Discharge Instructions (Signed)
 You need to be on the medication to prevent seizures.  Do not stop this medication unless your physician tells you to do so.  Please follow-up with a neurologist for further evaluation regarding your seizures.

## 2024-03-19 NOTE — ED Triage Notes (Signed)
 Pt came in via EMS from home w/ c/o of a unwitnessed absence seizure. Pt is alert and oriented x4 during triage. Pt has an aid that has not been by today to give meds and clean her up. Hx of stroke w/ left sided deficits.

## 2024-03-19 NOTE — ED Notes (Signed)
 PTAR called

## 2024-03-19 NOTE — ED Triage Notes (Signed)
 Pt was seen yesterday for seizures and was dc. Pt has been dizzy since coming home, pt paralyzed on left side. Pt requesting for snf placement. axox4

## 2024-03-19 NOTE — ED Notes (Signed)
 Soiled brief changed and warm blankets given.

## 2024-03-19 NOTE — ED Provider Notes (Signed)
 Sheri Beck Provider Note   CSN: [id number] Arrival date & time: 6/Sheri/25  1832     Beck presents with: Dizziness   Sheri Beck is a 58 y.o. female.   HPI    Sheri Beck comes in with chief complaint of weakness and dizziness. Beck has history of stroke, seizures, hypertension.  Sheri Beck resides at home.  Sheri Beck states that currently Sheri Beck is not having good support at home.  Sheri Beck, who sometimes helps, is actually due for C-section tomorrow.  Beck states that Sheri Beck woke up from Sheri nap with dizziness.  Dizziness is described as constant, spinning sensation.  Last known normal around 8 AM when Sheri Beck went for a nap after ER visit y'day.  Beck also feels that Sheri speech is more slurred than usual.  Prior to Admission medications   Medication Sig Start Date End Date Taking? Authorizing Provider  amLODipine  (NORVASC ) 10 MG tablet TAKE 1/2 TABLET(5 MG) BY MOUTH DAILY. NEED APPOINTMENT FOR REFILLS. Beck taking differently: Take 5 mg by mouth daily. 09/06/23   Aida House, MD  apixaban  (ELIQUIS ) 5 MG TABS tablet Take 1 tablet (5 mg total) by mouth 2 (two) times daily. 11/01/23   Aida House, MD  busPIRone  (BUSPAR ) 7.5 MG tablet Take 7.5 mg by mouth daily at 12 noon.    [provider]  Cholecalciferol (VITAMIN D3) 1.25 MG (50000 UT) CAPS Take 1 capsule (50,000 Units total) by mouth every 7 (seven) days. Beck not taking: Reported on 03/11/2024 03/08/23   Aida House, MD  escitalopram  (LEXAPRO ) 10 MG tablet Take 1 tablet (10 mg total) by mouth daily. 11/01/23   Aida House, MD  levETIRAcetam  (KEPPRA ) 500 MG tablet Take 1 tablet (500 mg total) by mouth 2 (two) times daily. 6/Sheri/25   Alissa April, MD  lisinopril -hydrochlorothiazide  (ZESTORETIC ) 20-12.5 MG tablet TAKE 1 TABLET BY MOUTH DAILY 09/09/23   Aida House, MD  potassium chloride  SA (KLOR-CON  M) 20 MEQ tablet TAKE 1 TABLET(20 MEQ) BY  MOUTH DAILY Beck taking differently: Take 20 mEq by mouth daily. 02/29/24   Aida House, MD    Allergies: Beck has no known allergies.    Review of Systems  All other systems reviewed and are negative.   Updated Vital Signs BP (!) 141/102   Pulse 61   Temp 97.7 F (36.5 C) (Oral)   Resp 14   Ht 5' 2 (1.575 m)   SpO2 100%   BMI 33.47 kg/m   Physical Exam Vitals and nursing note reviewed.  Constitutional:      Appearance: Sheri Beck is well-developed.  HENT:     Head: Atraumatic.   Eyes:     Extraocular Movements: Extraocular movements intact.     Pupils: Pupils are equal, round, and reactive to light.     Comments: No nystagmus   Cardiovascular:     Rate and Rhythm: Normal rate.  Pulmonary:     Effort: Pulmonary effort is normal.   Musculoskeletal:     Cervical back: Normal range of motion and neck supple.   Skin:    General: Skin is warm and dry.   Neurological:     Mental Status: Sheri Beck is alert and oriented to person, place, and time.     Cranial Nerves: No cranial nerve deficit.     Sensory: Sensory deficit present.     Motor: Weakness present.     Comments: L sided numbness and weakness    (  all labs ordered are listed, but only abnormal results are displayed) Labs Reviewed - No data to display  EKG: None  Radiology: No results found.   Procedures   Medications Ordered in the ED - No data to display                                  Medical Decision Making Amount and/or Complexity of Data Reviewed Radiology: ordered.   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  Sheri Beck is also requesting social work Administrator, sports.  Differential diagnosis for the dizziness and slurred speech includes posterior stroke, brain bleed, severe electrolyte abnormality, anemia, dehydration.  Beck was just seen in the ER within the last 12 hours.  Sheri labs at that time are reassuring.  No lab workup indicated.  Sheri Beck reports new neurologic deficit, therefore MRI brain has been ordered.  TOC consulted.  Beck is not eligible for home health, therefore they have requested PT OT consult.  TOC hold for now.  Final diagnoses:  None    ED Discharge Orders     None          [name], MD 06/Sheri/25 2329

## 2024-03-20 ENCOUNTER — Other Ambulatory Visit (HOSPITAL_COMMUNITY): Payer: Self-pay

## 2024-03-20 ENCOUNTER — Telehealth (HOSPITAL_COMMUNITY): Payer: Self-pay | Admitting: Pharmacy Technician

## 2024-03-20 NOTE — Evaluation (Signed)
 Physical Therapy Evaluation Patient Details Name: Sheri Beck MRN: [id number] DOB: 29-Oct-1965 Today's Date: 03/20/2024  History of Present Illness  Patient is 58 y.o. female presents 03/19/24 to ED with chief complaint of weakness and dizziness. Of note pt seen and discharged from [facility] ~12 hours prior to presentation to [facility] for seizure like activity (not taking anticonvulsants).  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   Clinical Impression  BURNA ATLAS is 58 y.o. female admitted with above HPI and diagnosis. Patient is currently limited by functional impairments below (see PT problem list). Patient unable to provide reliable or clear home set up/assist and PLOF/CLOF.Aaron Aas Per discussion pt lives with daughter and requires assist for all mobility at bed level at baseline. Unclear if pt completing transfers at this time and what equipment pt is utilizing for mobility. On admission ~ 1 year ago pt reported transferring to [facility] with caregiver assist and able to complete dressing at bed level. At that time pt being cared for by significant other, but he is no longer providing care and daughter is having a c-section on 6/17 and will be unable to care for pt. Currently max assist for bed mobility at this time.Patient will benefit from continued skilled PT interventions to address impairments and progress independence with mobility. Patient will benefit from continued inpatient follow up therapy, <3 hours/day. Acute PT will follow and progress as able.         If plan is discharge home, recommend the following: Two people to help with walking and/or transfers;Two people to help with bathing/dressing/bathroom;Assistance with cooking/housework;Assist for transportation;Help with stairs or ramp for entrance;Direct supervision/assist for medications management;Supervision due to  cognitive status   Can travel by private vehicle   No    Equipment Recommendations None recommended by PT (defer to next venue)  Recommendations for Other Services  OT consult    Functional Status Assessment Patient has had a recent decline in their functional status and/or demonstrates limited ability to make significant improvements in function in a reasonable and predictable amount of time     Precautions / Restrictions Precautions Precautions: Fall Restrictions Weight Bearing Restrictions Per Provider Order: No      Mobility  Bed Mobility Overal bed mobility: Needs Assistance Bed Mobility: Rolling Rolling: Max assist, Used rails         General bed mobility comments: max assist for all bed mobility to roll and reposition, pt able to use Rt UE and LE with bed in trendelenburg to boost superiorly in bed.    Transfers                        Ambulation/Gait                  Stairs            Wheelchair Mobility     Tilt Bed    Modified Rankin (Stroke Patients Only)       Balance                                             Pertinent Vitals/Pain Pain Assessment Pain Assessment: Faces Faces Pain Scale: Hurts a little bit Pain Location: Lt UE with PROMstretching Pain Descriptors / Indicators: Discomfort  Pain Intervention(s): Limited activity within patient's tolerance, Monitored during session, Repositioned    Home Living Family/patient expects to be discharged to:: Private residence Living Arrangements: Alone;Other relatives Available Help at Discharge:  (daughter just had a baby and cannot help as much) Type of Home: Apartment Home Access: Stairs to enter   Entrance Stairs-Number of Steps: 1   Home Layout: One level (unclear)   Additional Comments: pt unreliable with home set up report and no family available to confirm. pt wiht unrealistic expectations and poor understanding of CLOF reporting [facility](pt does not understand that she would need to travel back and forth and need to be abe to transfer in/out of lift chair for pool.    Prior Function Prior Level of Function : Needs assist             Mobility Comments: suspect mostly bed level or transfering to WC only       Extremity/Trunk Assessment   Upper Extremity Assessment Upper Extremity Assessment: Defer to OT evaluation;Right hand dominant;LUE deficits/detail LUE Deficits / Details: hypertonicity with limited elbow extension, limited pronation/supination of forearm, limited wrist flex/ext and digit flex contracture. LUE Coordination: decreased fine motor;decreased gross motor    Lower Extremity Assessment Lower Extremity Assessment: LLE deficits/detail;RLE deficits/detail RLE Deficits / Details: grossly 3+/5 with hip flexion, knee flex/ext and dorsiflex/plantarflexion LLE Deficits / Details: hypertonicity with knee flex/ext and plantar flexion contracture of foot and toes LLE Coordination: decreased fine motor;decreased gross motor    Cervical / Trunk Assessment Cervical / Trunk Assessment: Normal  Communication   Communication Factors Affecting Communication: Reduced clarity of speech    Cognition Arousal: Alert Behavior During Therapy: Flat affect   PT - Cognitive impairments: Awareness, Memory, Attention, Sequencing, Problem solving, Safety/Judgement, Initiation, No family/caregiver present to determine baseline                       PT - Cognition Comments: suspect deficits from Following commands: Impaired Following commands impaired: Follows one step commands with increased time     Cueing Cueing Techniques: Verbal cues, Gestural cues     General Comments      Exercises     Assessment/Plan    PT Assessment Patient needs continued PT services  PT Problem List Decreased coordination;Decreased cognition;Decreased strength;Decreased range of  motion;Decreased activity tolerance;Decreased balance;Decreased mobility;Decreased knowledge of use of DME;Decreased safety awareness;Decreased knowledge of precautions;Cardiopulmonary status limiting activity;Impaired sensation;Impaired tone;Obesity       PT Treatment Interventions DME instruction;Gait training;Stair training;Functional mobility training;Therapeutic activities;Therapeutic exercise;Balance training;Neuromuscular re-education;Cognitive remediation;Patient/family education;Wheelchair mobility training;Manual techniques    PT Goals (Current goals can be found in the Care Plan section)  Acute Rehab PT Goals Patient Stated Goal: do pool rehab PT Goal Formulation: With patient Time For Goal Achievement: 04/03/24 Potential to Achieve Goals: Fair    Frequency Min 1X/week     Co-evaluation               AM-PAC PT 6 Clicks Mobility  Outcome Measure Help needed turning from your back to your side while in a flat bed without using bedrails?: Total Help needed moving from lying on your back to sitting on the side of a flat bed without using bedrails?: Total Help needed moving to and from a bed to a chair (including a wheelchair)?: Total Help needed standing up from a chair using your arms (e.g., wheelchair or bedside chair)?: Total Help needed  to walk in hospital room?: Total Help needed climbing 3-5 steps with a railing? : Total 6 Click Score: 6    End of Session   Activity Tolerance: Patient tolerated treatment well;Other (comment) (limited, max +2 for EOB) Patient left: in bed;with call bell/phone within reach Nurse Communication: Mobility status PT Visit Diagnosis: Muscle weakness (generalized) (M62.81);Difficulty in walking, not elsewhere classified (R26.2);Other symptoms and signs involving the nervous system (R29.898);Hemiplegia and hemiparesis Hemiplegia - Right/Left: Left Hemiplegia - dominant/non-dominant: Non-dominant Hemiplegia - caused by: Cerebral  infarction (chronic)    Time: 0800-0822 PT Time Calculation (min) (ACUTE ONLY): 22 min   Charges:   PT Evaluation $PT Eval Moderate Complexity: 1 Mod   PT General Charges $$ ACUTE PT VISIT: 1 Visit         Tish Forge, DPT Acute Rehabilitation Services Office [phone]  03/20/24 10:51 AM

## 2024-03-20 NOTE — Discharge Planning (Signed)
 RNCM consulted regarding safe discharge planning (Home with Home Health vs Skilled Nursing Facility Placement).  Physical Therapy evaluation placed; will follow up after recommendations from PT.     Desirea Mizrahi J. Rachel Budds, BSN, RN, NCM  Transitions of Care  Nurse Case Manager  [facility] Emergency Departments  Operative Services  [phone]

## 2024-03-20 NOTE — ED Notes (Signed)
 Snack given.

## 2024-03-20 NOTE — ED Notes (Signed)
 This RN placed pt in clean and dry brief. Also placed barrier cream on pt and repositioned her. Pt provided with pillows under hips for comfort. Pt given sandwich and graham crackers for a snack. Call light and room phone placed within reach and pt given warm blanket. No other needs voiced at this time from pt.

## 2024-03-20 NOTE — Telephone Encounter (Signed)
 Patient Product/process development scientist completed.    The patient is insured through [employer] [place].     Ran test claim for Eliquis 5 mg and the current 30 day co-pay is $4.00.   This test claim was processed through [facility]- copay amounts may vary at other pharmacies due to pharmacy/plan contracts, or as the patient moves through the different stages of their insurance plan.     Roland Earl, CPHT Pharmacy Technician III Certified Patient Advocate [facility] Patient Advocate Team Direct Number: [phone]  Fax: [phone]

## 2024-03-20 NOTE — ED Notes (Signed)
 Pt was wet ; bed changed, cleaned, gown changed. Warm blankets provided. Barrier cream applied as pt requested.

## 2024-03-20 NOTE — ED Notes (Signed)
 Pt laying in bed with call light within reach. Pt's blankets readjusted. No other needs voiced at this time. Call light within reach.

## 2024-03-21 NOTE — ED Notes (Signed)
 OT at bedside.

## 2024-03-21 NOTE — [place] FL2 (Signed)
 [place]  MEDICAID FL2 LEVEL OF CARE FORM     IDENTIFICATION  Patient Name: Sheri Beck Birthdate: Jan 15, 1966 Sex: female Admission Date (Current Location): 03/19/2024  [facility] and [place] Number:  Producer, television/film/video and Address:  [facility],  [address] [place], [place] [address]      Provider Number: [id number]  Attending Physician Name and Address:  Dorenda Gandy, MD  Relative Name and Phone Number:  Lona Rist (Daughter)  [phone] [facility] Phone)    Current Level of Care: Hospital Recommended Level of Care: Skilled Nursing Facility Prior Approval Number:    Date Approved/Denied:   PASRR Number: [id number]  Discharge Plan: SNF    Current Diagnoses: 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   Major depressive disorder, single episode, 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   Adjustment disorder with depressed mood 10/21/2022   Delirium due to multiple etiologies, acute, hypoactive 10/21/2022   Tobacco use 10/21/2022   Cocaine dependence (HCC) 10/21/2022   Episodic cannabis use 10/21/2022   Snoring 10/21/2022   Alcohol use 10/21/2022   Stroke (HCC) 10/16/2022   Stroke (cerebrum) (HCC) 10/16/2022   Hyperlipidemia 09/28/2022   Severe obesity (BMI 35.0-39.9) with comorbidity (HCC) 09/28/2022   Acute bacterial conjunctivitis of right eye 05/06/2020   Dental abscess 05/06/2020   Essential hypertension 05/06/2020    Orientation RESPIRATION BLADDER Height & Weight     Self, Time, Situation, Place  Normal Continent Weight:   Height:  5' 2 (157.5  cm)  BEHAVIORAL SYMPTOMS/MOOD NEUROLOGICAL BOWEL NUTRITION STATUS      Continent Diet (Regular)  AMBULATORY STATUS COMMUNICATION OF NEEDS Skin   Extensive Assist Verbally Normal                       Personal Care Assistance Level of Assistance  Bathing, Feeding, Dressing Bathing Assistance: Maximum assistance Feeding assistance: Limited assistance Dressing Assistance: Maximum assistance     Functional Limitations Info  Sight, Speech, Hearing Sight Info: Adequate Hearing Info: Adequate Speech Info: Adequate    SPECIAL CARE FACTORS FREQUENCY  PT (By licensed PT), OT (By licensed OT)     PT Frequency: x5/week OT Frequency: x5/week            Contractures Contractures Info: Not present    Additional Factors Info  Code Status, Allergies Code Status Info: Full Allergies Info: No Known Allergies           Current Medications (03/21/2024):  This is the current hospital active medication list Current Facility-Administered Medications  Medication Dose Route Frequency Provider Last Rate Last Admin   acetaminophen  (TYLENOL ) tablet 650 mg  650 mg Oral Q4H PRN Nanavati, Ankit, MD   650 mg at 03/21/24 0656   amLODipine  (NORVASC ) tablet 5 mg  5 mg Oral Daily Nanavati, Ankit, MD   5 mg at 03/20/24 0831   apixaban  (ELIQUIS ) tablet 5 mg  5 mg Oral BID Nanavati, Ankit, MD   5 mg at 03/20/24 2133   busPIRone  (BUSPAR ) tablet 7.5 mg  7.5 mg Oral Daily Nanavati, Ankit, MD   7.5 mg at 03/20/24 1251   escitalopram  (  LEXAPRO ) tablet 10 mg  10 mg Oral Daily Nanavati, Ankit, MD   10 mg at 03/20/24 0830   lisinopril  (ZESTRIL ) tablet 20 mg  20 mg Oral Daily Nanavati, Ankit, MD   20 mg at 03/20/24 0830   And   hydrochlorothiazide  (HYDRODIURIL ) tablet 12.5 mg  12.5 mg Oral Daily Nanavati, Ankit, MD   12.5 mg at 03/20/24 0831   levETIRAcetam  (KEPPRA ) tablet 500 mg  500 mg Oral BID Nanavati, Ankit, MD   500 mg at 03/20/24 2133   Vitamin D (Ergocalciferol) (DRISDOL) 1.25 MG (50000 UNIT) capsule  50,000 Units  50,000 Units Oral Q Wed Nanavati, Ankit, MD       zolpidem (AMBIEN) tablet 5 mg  5 mg Oral QHS PRN Nanavati, Ankit, MD   5 mg at 03/20/24 0132   Current Outpatient Medications  Medication Sig Dispense Refill   amLODipine  (NORVASC ) 10 MG tablet TAKE 1/2 TABLET(5 MG) BY MOUTH DAILY. NEED APPOINTMENT FOR REFILLS. (Patient taking differently: Take 5 mg by mouth daily.) 90 tablet 0   apixaban  (ELIQUIS ) 5 MG TABS tablet Take 1 tablet (5 mg total) by mouth 2 (two) times daily. 180 tablet 1   escitalopram  (LEXAPRO ) 10 MG tablet Take 1 tablet (10 mg total) by mouth daily. 90 tablet 1   levETIRAcetam  (KEPPRA ) 500 MG tablet Take 1 tablet (500 mg total) by mouth 2 (two) times daily. 60 tablet 3   lisinopril -hydrochlorothiazide  (ZESTORETIC ) 20-12.5 MG tablet TAKE 1 TABLET BY MOUTH DAILY 90 tablet 0   potassium chloride  SA (KLOR-CON  M) 20 MEQ tablet TAKE 1 TABLET(20 MEQ) BY MOUTH DAILY (Patient taking differently: Take 20 mEq by mouth daily.) 90 tablet 0   busPIRone  (BUSPAR ) 7.5 MG tablet Take 7.5 mg by mouth daily at 12 noon.     Cholecalciferol (VITAMIN D3) 1.25 MG (50000 UT) CAPS Take 1 capsule (50,000 Units total) by mouth every 7 (seven) days. (Patient not taking: Reported on 03/11/2024) 4 capsule 0     Discharge Medications: Please see discharge summary for a list of discharge medications.  Relevant Imaging Results:  Relevant Lab Results:   Additional Information [id number]  Jabier Martens, LCSW

## 2024-03-21 NOTE — Evaluation (Signed)
 Occupational Therapy Evaluation Patient Details Name: Sheri Beck MRN: [id number] DOB: 01-12-1966 Today's Date: 03/21/2024   History of Present Illness   Patient is 58 y.o. female presents 03/19/24 to ED with chief complaint of weakness and dizziness. Of note pt seen and discharged from [facility] ~12 hours prior to presentation to [facility] for seizure like activity (not taking anticonvulsants).  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     Clinical Impressions PTA, pt lived alone and reports she had intermittent assist from family and PCA for BADL. Pt states her PCA gets her into a wheelchair daily, unsure accuracy of report. Pt additionally reports aide assist with ADL. Upon eval, pt with dense L hemiplegia with LUE contractures noted below. Pt needing mod-total A for BADL, and mod A for bed mobility today. Patient will benefit from continued inpatient follow up therapy, <3 hours/day to reduce burden of care and optimize functionality.       If plan is discharge home, recommend the following:   Two people to help with walking and/or transfers;Two people to help with bathing/dressing/bathroom;Assistance with cooking/housework;Assistance with feeding;Direct supervision/assist for medications management;Direct supervision/assist for financial management;Assist for transportation;Help with stairs or ramp for entrance     Functional Status Assessment   Patient has had a recent decline in their functional status and demonstrates the ability to make significant improvements in function in a reasonable and predictable amount of time.     Equipment Recommendations   Other (comment) (defer)     Recommendations for Other Services         Precautions/Restrictions   Precautions Precautions: Fall Restrictions Weight Bearing Restrictions Per Provider Order:  No     Mobility Bed Mobility Overal bed mobility: Needs Assistance Bed Mobility: Supine to Sit, Sit to Supine     Supine to sit: Mod assist Sit to supine: Mod assist   General bed mobility comments: coming to L EOB with mod A for BLE off EOB and truncal elevation, pt with good initiation and participation, able to assist a lot with core and RUE    Transfers                   General transfer comment: deferred as pt on high stretcher bed and only +1 assist available      Balance                                           ADL either performed or assessed with clinical judgement   ADL Overall ADL's : Needs assistance/impaired Eating/Feeding: Set up;Bed level   Grooming: Moderate assistance;Sitting   Upper Body Bathing: Maximal assistance;Sitting   Lower Body Bathing: Total assistance   Upper Body Dressing : Maximal assistance;Sitting   Lower Body Dressing: Total assistance;+2 for safety/equipment;+2 for physical assistance                       Vision         Perception         Praxis         Pertinent Vitals/Pain Pain Assessment Pain Assessment: Faces Faces Pain Scale: Hurts a little bit Pain Location: Lt UE with PROMstretching, LLE at rest Pain Descriptors / Indicators: Discomfort Pain Intervention(s): Limited activity within patient's tolerance,  Monitored during session     Extremity/Trunk Assessment Upper Extremity Assessment Upper Extremity Assessment: Left hand dominant LUE Deficits / Details: hypertonicity with limited elbow extension, limited pronation/supination of forearm, limited wrist flex/ext and digit flex contracture. Able to provide ROM into elbow extension lacking 90 degrees and able to ext digits enough to place wash cloth in hand LUE Coordination: decreased fine motor;decreased gross motor   Lower Extremity Assessment Lower Extremity Assessment: Defer to PT evaluation   Cervical / Trunk  Assessment Cervical / Trunk Assessment: Normal   Communication Communication Communication: Impaired Factors Affecting Communication: Reduced clarity of speech   Cognition Arousal: Alert Behavior During Therapy: Flat affect                                 Following commands: Impaired Following commands impaired: Follows one step commands with increased time     Cueing  General Comments   Cueing Techniques: Verbal cues;Gestural cues      Exercises     Shoulder Instructions      Home Living Family/patient expects to be discharged to:: Private residence Living Arrangements: Alone;Other relatives Available Help at Discharge:  (daughter just had a baby and cannot help as much) Type of Home: Apartment Home Access: Stairs to enter Entrance Stairs-Number of Steps: 1   Home Layout: One level (unclear)     Bathroom Shower/Tub: Tub/shower unit;Sponge bathes at baseline             Additional Comments: pt unreliable with home set up report and no family available to confirm. pt wiht unrealistic expectations and poor understanding of CLOF reporting [facility](pt does not understand that she would need to travel back and forth and need to be abe to transfer in/out of lift chair for pool.      Prior Functioning/Environment Prior Level of Function : Needs assist             Mobility Comments: suspect mostly bed level or transfering to WC only ADLs Comments: suspect assist with all LB ADL and toileting. pt reports she gets to wheelchair daily unsure accuracy of report    OT Problem List: Decreased strength;Decreased activity tolerance;Impaired balance (sitting and/or standing);Decreased cognition;Decreased coordination;Impaired vision/perception;Decreased range of motion;Decreased safety awareness;Impaired tone;Impaired UE functional use   OT Treatment/Interventions: Self-care/ADL training;Therapeutic  exercise;DME and/or AE instruction;Therapeutic activities;Balance training;Patient/family education;Visual/perceptual remediation/compensation;Cognitive remediation/compensation      OT Goals(Current goals can be found in the care plan section)   Acute Rehab OT Goals Patient Stated Goal: get pool therapy and walk OT Goal Formulation: With patient Time For Goal Achievement: 04/04/24 Potential to Achieve Goals: Good   OT Frequency:  Min 1X/week    Co-evaluation              AM-PAC OT 6 Clicks Daily Activity     Outcome Measure Help from another person eating meals?: A Little Help from another person taking care of personal grooming?: A Lot Help from another person toileting, which includes using toliet, bedpan, or urinal?: Total Help from another person bathing (including washing, rinsing, drying)?: A Lot Help from another person to put on and taking off regular upper body clothing?: A Lot Help from another person to put on and taking off regular lower body clothing?: Total 6 Click Score: 11   End of Session Nurse Communication: Mobility status  Activity Tolerance: Patient tolerated treatment  well Patient left: in bed;with call bell/phone within reach  OT Visit Diagnosis: Unsteadiness on feet (R26.81);Muscle weakness (generalized) (M62.81);Other symptoms and signs involving cognitive function;Low vision, both eyes (H54.2);Feeding difficulties (R63.3);Hemiplegia and hemiparesis Hemiplegia - Right/Left: Left                Time: 1610-9604 OT Time Calculation (min): 21 min Charges:  OT General Charges $OT Visit: 1 Visit OT Evaluation $OT Eval Moderate Complexity: 1 Mod  Karilyn Ouch, OTR/L [facility] Acute Rehabilitation Office: [phone]   Emery Hans 03/21/2024, 1:37 PM

## 2024-03-21 NOTE — ED Provider Notes (Signed)
 Emergency Medicine Observation Re-evaluation Note  Sheri Beck is a 58 y.o. female, seen on rounds today.  Pt initially presented to the ED for complaints of Dizziness Currently, the patient is resting comfortably.  Physical Exam  BP 108/86   Pulse 64   Temp 97.7 F (36.5 C) (Oral)   Resp 17   Ht 5' 2 (1.575 m)   SpO2 100%   BMI 33.47 kg/m  Physical Exam Vitals and nursing note reviewed.  Constitutional:      General: She is not in acute distress.    Appearance: She is well-developed.  HENT:     Head: Normocephalic and atraumatic.   Eyes:     Conjunctiva/sclera: Conjunctivae normal.   Pulmonary:     Effort: Pulmonary effort is normal. No respiratory distress.   Musculoskeletal:        General: No swelling.     Cervical back: Neck supple.   Skin:    General: Skin is warm and dry.     Capillary Refill: Capillary refill takes less than 2 seconds.   Neurological:     Mental Status: She is alert.   Psychiatric:        Mood and Affect: Mood normal.      ED Course / MDM  EKG:   I have reviewed the labs performed to date as well as medications administered while in observation.  Recent changes in the last 24 hours include continued TOC evaluation.  Plan  Current plan is for placement.    Karlyn Overman, MD 03/22/24 [phone]

## 2024-03-21 NOTE — ED Notes (Signed)
 Pt in bed, repositioned pt, pt requests lotion for her back and barrier cream for her buttocks, both applied, warm blankets given, meal tray, cut pts food for easier consumption.  Call bell within reach, bed rails up.

## 2024-03-21 NOTE — ED Notes (Signed)
 Pt has repositioned herself, pt in bed with eyes closed, resps even and unlabored

## 2024-03-21 NOTE — Progress Notes (Signed)
 Margretta Shi @ [place] requested updated UDS before considering pt. EDP notified. UDS ordered. Per RN, will try for urine after pt finishes lunch. No other bed offers at this time. TOC following.

## 2024-03-22 ENCOUNTER — Emergency Department (HOSPITAL_COMMUNITY): Payer: MEDICAID

## 2024-03-22 DIAGNOSIS — I959 Hypotension, unspecified: Secondary | ICD-10-CM

## 2024-03-22 LAB — URINALYSIS, W/ REFLEX TO CULTURE (INFECTION SUSPECTED)
Bilirubin Urine: NEGATIVE
Glucose, UA: NEGATIVE mg/dL
Hgb urine dipstick: NEGATIVE
Ketones, ur: NEGATIVE mg/dL
Nitrite: NEGATIVE
Protein, ur: NEGATIVE mg/dL
Specific Gravity, Urine: 1.025 (ref 1.005–1.030)
pH: 5 (ref 5.0–8.0)

## 2024-03-22 LAB — CBC WITH DIFFERENTIAL/PLATELET
Abs Immature Granulocytes: 0.01 10*3/uL (ref 0.00–0.07)
Abs Immature Granulocytes: 0.01 K/uL (ref 0.00–0.07)
Basophils Absolute: 0.1 10*3/uL (ref 0.0–0.1)
Basophils Absolute: 0.1 K/uL (ref 0.0–0.1)
Basophils Relative: 1 %
Basophils Relative: 1 %
Eosinophils Absolute: 0.3 10*3/uL (ref 0.0–0.5)
Eosinophils Absolute: 0.2 K/uL (ref 0.0–0.5)
Eosinophils Relative: 5 %
Eosinophils Relative: 4 %
HCT: 40.7 % (ref 36.0–46.0)
HCT: 42.1 % (ref 36.0–46.0)
Hemoglobin: 13.4 g/dL (ref 12.0–15.0)
Hemoglobin: 13.8 g/dL (ref 12.0–15.0)
Immature Granulocytes: 0 %
Immature Granulocytes: 0 %
Lymphocytes Relative: 60 %
Lymphocytes Relative: 56 %
Lymphs Abs: 3.5 10*3/uL (ref 0.7–4.0)
Lymphs Abs: 3.2 K/uL (ref 0.7–4.0)
MCH: 28.6 pg (ref 26.0–34.0)
MCH: 28.8 pg (ref 26.0–34.0)
MCHC: 32.9 g/dL (ref 30.0–36.0)
MCHC: 32.8 g/dL (ref 30.0–36.0)
MCV: 86.8 fL (ref 80.0–100.0)
MCV: 87.7 fL (ref 80.0–100.0)
Monocytes Absolute: 0.5 10*3/uL (ref 0.1–1.0)
Monocytes Absolute: 0.5 K/uL (ref 0.1–1.0)
Monocytes Relative: 9 %
Monocytes Relative: 8 %
Neutro Abs: 1.5 10*3/uL — ABNORMAL LOW (ref 1.7–7.7)
Neutro Abs: 1.8 K/uL (ref 1.7–7.7)
Neutrophils Relative %: 25 %
Neutrophils Relative %: 31 %
Platelets: 242 10*3/uL (ref 150–400)
Platelets: 255 K/uL (ref 150–400)
RBC: 4.69 MIL/uL (ref 3.87–5.11)
RBC: 4.8 MIL/uL (ref 3.87–5.11)
RDW: 13.6 % (ref 11.5–15.5)
RDW: 13.8 % (ref 11.5–15.5)
WBC: 5.8 10*3/uL (ref 4.0–10.5)
WBC: 5.7 K/uL (ref 4.0–10.5)
nRBC: 0 % (ref 0.0–0.2)
nRBC: 0 % (ref 0.0–0.2)

## 2024-03-22 LAB — BASIC METABOLIC PANEL WITH GFR
Anion gap: 10 (ref 5–15)
Anion gap: 6 (ref 5–15)
BUN: 17 mg/dL (ref 6–20)
BUN: 17 mg/dL (ref 6–20)
CO2: 25 mmol/L (ref 22–32)
CO2: 26 mmol/L (ref 22–32)
Calcium: 9 mg/dL (ref 8.9–10.3)
Calcium: 8.8 mg/dL — ABNORMAL LOW (ref 8.9–10.3)
Chloride: 107 mmol/L (ref 98–111)
Chloride: 108 mmol/L (ref 98–111)
Creatinine, Ser: 0.67 mg/dL (ref 0.44–1.00)
Creatinine, Ser: 0.71 mg/dL (ref 0.44–1.00)
GFR, Estimated: >60 mL/min (ref >60)
GFR, Estimated: >60 mL/min (ref >60)
Glucose, Bld: 103 mg/dL — ABNORMAL HIGH (ref 70–99)
Glucose, Bld: 95 mg/dL (ref 70–99)
Potassium: 3.4 mmol/L — ABNORMAL LOW (ref 3.5–5.1)
Potassium: 4 mmol/L (ref 3.5–5.1)
Sodium: 142 mmol/L (ref 135–145)
Sodium: 140 mmol/L (ref 135–145)

## 2024-03-22 LAB — I-STAT CG4 LACTIC ACID, ED: Lactic Acid, Venous: 1.2 mmol/L (ref 0.5–1.9)

## 2024-03-22 MED ORDER — SODIUM CHLORIDE 0.9 % IV BOLUS
2000.0000 mL | Freq: Once | INTRAVENOUS | Status: AC
  Administered 2024-03-22: 2000 mL via INTRAVENOUS

## 2024-03-22 MED ORDER — SODIUM CHLORIDE 0.9 % IV BOLUS
1000.0000 mL | Freq: Once | INTRAVENOUS | Status: AC
  Administered 2024-03-22: 1000 mL via INTRAVENOUS

## 2024-03-22 NOTE — H&P (Addendum)
 History and Physical    Sheri Beck ZOX:[id number] DOB: 1966/08/29 DOA: 03/19/2024  Patient coming from: Home.  Chief Complaint: Weakness.  HPI: Sheri Beck is a 58 y.o. female with history of stroke with left-sided hemiplegia, DVT, hypertension, anxiety, prior history of substance abuse had come to the ER on March 19, 2024 with seizure-like activity.  Patient used to be on Keppra  which she had not taken for a long time and patient was discharged back on Keppra .  But after discharging home patient comes back with complaints of dizziness and weakness was found to be hypotensive was admitted to the ER for further observation.  Patient denies any nausea vomiting diarrhea fever or chills.  ED Course: In the ER patient remained hypotensive and was given fluid despite which patient remained hypotensive labs are largely unremarkable EKG shows normal sinus rhythm.  Since patient remained hypotensive hospitalist was consulted for further management.  Review of Systems: As per HPI, rest all negative.   Past Medical History:  Diagnosis Date   Hypertension    Stroke [facility]) 10/2022    Past Surgical History:  Procedure Laterality Date   CRANIOTOMY Right 10/16/2022   Procedure: RIGHT CRANIECTOMY;  Surgeon: Garry Kansas, MD;  Location: [facility] OR;  Service: Neurosurgery;  Laterality: Right;     reports that she quit smoking about 17 months ago. Her smoking use included cigarettes. She has been exposed to tobacco smoke. She has never used smokeless tobacco. She reports that she does not currently use alcohol. She reports that she does not currently use drugs after having used the following drugs: Cocaine.  No Known Allergies  Family History  Problem Relation Age of Onset   Cancer Mother    Heart disease Father    Glaucoma Father    Varicose Veins Paternal Grandfather    Breast cancer Neg Hx     Prior to Admission medications   Medication Sig Start Date End Date Taking? Authorizing Provider   amLODipine  (NORVASC ) 10 MG tablet TAKE 1/2 TABLET(5 MG) BY MOUTH DAILY. NEED APPOINTMENT FOR REFILLS. Patient taking differently: Take 5 mg by mouth daily. 09/06/23  Yes Aida House, MD  apixaban  (ELIQUIS ) 5 MG TABS tablet Take 1 tablet (5 mg total) by mouth 2 (two) times daily. 11/01/23  Yes Aida House, MD  escitalopram  (LEXAPRO ) 10 MG tablet Take 1 tablet (10 mg total) by mouth daily. 11/01/23  Yes Aida House, MD  levETIRAcetam  (KEPPRA ) 500 MG tablet Take 1 tablet (500 mg total) by mouth 2 (two) times daily. 03/19/24  Yes Alissa April, MD  lisinopril -hydrochlorothiazide  (ZESTORETIC ) 20-12.5 MG tablet TAKE 1 TABLET BY MOUTH DAILY 09/09/23  Yes Aida House, MD  potassium chloride  SA (KLOR-CON  M) 20 MEQ tablet TAKE 1 TABLET(20 MEQ) BY MOUTH DAILY Patient taking differently: Take 20 mEq by mouth daily. 02/29/24  Yes Aida House, MD  busPIRone  (BUSPAR ) 7.5 MG tablet Take 7.5 mg by mouth daily at 12 noon.    [provider]  Cholecalciferol (VITAMIN D3) 1.25 MG (50000 UT) CAPS Take 1 capsule (50,000 Units total) by mouth every 7 (seven) days. Patient not taking: Reported on 03/11/2024 03/08/23   Aida House, MD    Physical Exam: Constitutional: Moderately built and nourished. Vitals:   03/22/24 1953 03/22/24 2030 03/22/24 2130 03/22/24 2200  BP: (!) 84/74 104/68 103/61 104/63  Pulse: 74 70 69   Resp: 15 13 13 13   Temp:      TempSrc:  SpO2: 100% 98% 99%   Height:       Eyes: Anicteric no pallor. ENMT: No discharge from the ears eyes nose and mouth. Neck: No mass felt.  No neck rigidity. Respiratory: No rhonchi or crepitations. Cardiovascular: S1-S2 heard. Abdomen: Soft nontender bowel sound present. Musculoskeletal: No edema. Skin: No rash. Neurologic: Alert awake oriented time place and person.  Left-sided hemiplegia. Psychiatric: Appears normal.  Normal affect.   Labs on Admission: I have personally reviewed following labs and imaging  studies  CBC: Recent Labs  Lab 03/19/24 0223 03/22/24 0821 03/22/24 1944  WBC 6.8 5.8 5.7  NEUTROABS 1.8 1.5* 1.8  HGB 14.6 13.4 13.8  HCT 45.4 40.7 42.1  MCV 90.4 86.8 87.7  PLT 266 242 255   Basic Metabolic Panel: Recent Labs  Lab 03/19/24 0223 03/22/24 0821 03/22/24 2100  NA 141 142 140  K 4.1 3.4* 4.0  CL 108 107 108  CO2 25 25 26   GLUCOSE 98 103* 95  BUN 19 17 17   CREATININE 0.67 0.67 0.71  CALCIUM  9.7 9.0 8.8*   GFR: Estimated Creatinine Clearance: 74.7 mL/min (by C-G formula based on SCr of 0.71 mg/dL). Liver Function Tests: No results for input(s): AST, ALT, ALKPHOS, BILITOT, PROT, ALBUMIN in the last 168 hours. No results for input(s): LIPASE, AMYLASE in the last 168 hours. No results for input(s): AMMONIA in the last 168 hours. Coagulation Profile: No results for input(s): INR, PROTIME in the last 168 hours. Cardiac Enzymes: No results for input(s): CKTOTAL, CKMB, CKMBINDEX, TROPONINI in the last 168 hours. BNP (last 3 results) No results for input(s): PROBNP in the last 8760 hours. HbA1C: No results for input(s): HGBA1C in the last 72 hours. CBG: No results for input(s): GLUCAP in the last 168 hours. Lipid Profile: No results for input(s): CHOL, HDL, LDLCALC, TRIG, CHOLHDL, LDLDIRECT in the last 72 hours. Thyroid  Function Tests: No results for input(s): TSH, T4TOTAL, FREET4, T3FREE, THYROIDAB in the last 72 hours. Anemia Panel: No results for input(s): VITAMINB12, FOLATE, FERRITIN, TIBC, IRON, RETICCTPCT in the last 72 hours. Urine analysis:    Component Value Date/Time   COLORURINE YELLOW 03/22/2024 0822   APPEARANCEUR HAZY (A) 03/22/2024 0822   LABSPEC 1.025 03/22/2024 0822   PHURINE 5.0 03/22/2024 0822   GLUCOSEU NEGATIVE 03/22/2024 0822   HGBUR NEGATIVE 03/22/2024 0822   BILIRUBINUR NEGATIVE 03/22/2024 0822   KETONESUR NEGATIVE 03/22/2024 0822   PROTEINUR NEGATIVE  03/22/2024 0822   NITRITE NEGATIVE 03/22/2024 0822   LEUKOCYTESUR TRACE (A) 03/22/2024 0822   Sepsis Labs: @LABRCNTIP (procalcitonin:4,lacticidven:4) )No results found for this or any previous visit (from the past 240 hours).   Radiological Exams on Admission: DG Chest Port 1 View Result Date: 03/22/2024 CLINICAL DATA:  Weakness. EXAM: PORTABLE CHEST 1 VIEW COMPARISON:  02/21/2023 FINDINGS: Stable cardiomediastinal contours. Low lung volumes. No pleural fluid, interstitial edema or airspace disease. Generalized osseous structures are unremarkable. IMPRESSION: Low lung volumes. No acute findings. Electronically Signed   By: Kimberley Penman M.D.   On: 03/22/2024 08:56    EKG: Independently reviewed.  Normal sinus rhythm.  Assessment/Plan Active Problems:   Essential hypertension   Hyperlipidemia   Stroke (HCC)   Hypotension    Persistent hypotension -   cause not clear.  Does not appear septic.  Patient takes lisinopril  and hydrochlorothiazide  and amlodipine  at home.  Lisinopril  and hydrochlorothiazide  are on hold but looks like patient received amlodipine .  Will hold amlodipine  continue with gentle hydration check cortisol level and continue to monitor.  History of stroke with prior history of craniotomy with left-sided hemiplegia on Eliquis . History of DVT on Eliquis . History of anxiety on BuSpar  and Lexapro . History of seizures recently restarted on Keppra   Since patient has persistent hypotension will need close monitoring further workup and more than 2 midnight stay.   DVT prophylaxis: Eliquis . Code Status: Full code. Family Communication: Discussed with patient. Disposition Plan: May need rehab. Consults called: Physical therapy. Admission status: Observation.

## 2024-03-22 NOTE — Progress Notes (Signed)
 CSW spoke with Mariah Shines at [facility] to discuss patient. CSW informed Mariah Shines that patient's UDS was negative. Mariah Shines states she will initiate insurance authorization.  Shepard Dicker, MSW, LCSW Transitions of Care  Clinical Social Worker II [phone]

## 2024-03-22 NOTE — ED Notes (Signed)
Pt brief and gown changed. Pt repositioned in bed.

## 2024-03-22 NOTE — ED Notes (Signed)
 Pt care taken, said that she does not feel well, denies nausea or vomiting, is dizzy.

## 2024-03-22 NOTE — ED Provider Notes (Addendum)
 Emergency Medicine Observation Re-evaluation Note  Sheri Beck is a 58 y.o. female, seen on rounds today.  Pt initially presented to the ED for complaints of Dizziness Currently, the patient is resting in bed awake and alert but is hypotensive.  Nurse notified Blue provider at 8 AM of patient being hypotensive.  Fluids and labs were ordered.  Physical Exam  BP (!) 79/60   Pulse 75   Temp 97.7 F (36.5 C) (Oral)   Resp 16   Ht 5' 2 (1.575 m)   SpO2 100%   BMI 33.47 kg/m  Physical Exam General: Chronically ill-appearing but in no acute distress Cardiac: Regular rate Lungs: Lungs are clear Psych: Awake and alert  ED Course / MDM  EKG:   I have reviewed the labs performed to date as well as medications administered while in observation.  Recent changes in the last 24 hours include no issues overnight until hypotension this morning.  Plan  Current plan is for patient was hypotensive this morning and was given IV fluids.  She is currently on 3 different blood pressure medications last given yesterday at 10 AM.  Will hold blood pressure meds at this time.  11:31 AM Labs wnl.  CBC/BMP.  After fluids bp has improved.   Almond Army, MD 03/22/24 1610    Almond Army, MD 03/22/24 1131

## 2024-03-22 NOTE — ED Provider Notes (Signed)
  Physical Exam  BP 103/61 (BP Location: Right Arm)   Pulse 69   Temp 98.2 F (36.8 C) (Oral)   Resp 13   Ht 5' 2 (1.575 m)   SpO2 99%   BMI 33.47 kg/m   Physical Exam  Procedures  Procedures  ED Course / MDM    Medical Decision Making I was notified by the nurse around 7 PM that patient is hypotensive.  I was able to review her records and patient has been here for the last 3 days.  Her initial presentation was dizziness.  Before that she had a seizure and was seen and discharged from the ED and then came back with dizziness.  On arrival, patient was hypotensive.  MRI did not show any stroke and UA and chest x-ray were negative.  Patient received IV fluids and blood pressure was low this morning.  Patient was thought to have low BP from her BP meds and blood pressure meds were held for the last several days.  I ordered IV fluids and repeat CBC and CMP and lactate level  10:00 PM Reviewed patient's labs and CBC and BMP unremarkable.  Lactate is negative.  Patient received 1 L bolus and blood pressure is now up to 103/61.  Initial plan was to place her in a nursing home.  However given that she is hypotensive requiring IV fluids, patient will need to be admitted to the hospital  Problems Addressed: Dizziness: acute illness or injury Hypotension, unspecified hypotension type: acute illness or injury  Amount and/or Complexity of Data Reviewed Labs: ordered. Decision-making details documented in ED Course. Radiology: ordered and independent interpretation performed. Decision-making details documented in ED Course.  Risk OTC drugs. Prescription drug management.          Dalene Duck, MD 03/22/24 [phone]

## 2024-03-22 NOTE — ED Notes (Addendum)
 First lactic 1.2 second one not needed

## 2024-03-23 ENCOUNTER — Inpatient Hospital Stay: Payer: MEDICAID | Admitting: Family Medicine

## 2024-03-23 ENCOUNTER — Telehealth: Payer: Self-pay

## 2024-03-23 ENCOUNTER — Encounter (HOSPITAL_COMMUNITY): Payer: Self-pay | Admitting: Internal Medicine

## 2024-03-23 ENCOUNTER — Ambulatory Visit: Payer: MEDICAID | Admitting: Family Medicine

## 2024-03-23 DIAGNOSIS — F411 Generalized anxiety disorder: Secondary | ICD-10-CM | POA: Diagnosis present

## 2024-03-23 DIAGNOSIS — Z8249 Family history of ischemic heart disease and other diseases of the circulatory system: Secondary | ICD-10-CM | POA: Diagnosis not present

## 2024-03-23 DIAGNOSIS — R42 Dizziness and giddiness: Secondary | ICD-10-CM | POA: Diagnosis present

## 2024-03-23 DIAGNOSIS — Z86718 Personal history of other venous thrombosis and embolism: Secondary | ICD-10-CM

## 2024-03-23 DIAGNOSIS — Z83511 Family history of glaucoma: Secondary | ICD-10-CM | POA: Diagnosis not present

## 2024-03-23 DIAGNOSIS — Z79899 Other long term (current) drug therapy: Secondary | ICD-10-CM | POA: Diagnosis not present

## 2024-03-23 DIAGNOSIS — I69354 Hemiplegia and hemiparesis following cerebral infarction affecting left non-dominant side: Secondary | ICD-10-CM | POA: Diagnosis not present

## 2024-03-23 DIAGNOSIS — I952 Hypotension due to drugs: Secondary | ICD-10-CM

## 2024-03-23 DIAGNOSIS — I10 Essential (primary) hypertension: Secondary | ICD-10-CM | POA: Diagnosis present

## 2024-03-23 DIAGNOSIS — R569 Unspecified convulsions: Secondary | ICD-10-CM | POA: Diagnosis present

## 2024-03-23 DIAGNOSIS — E785 Hyperlipidemia, unspecified: Secondary | ICD-10-CM | POA: Diagnosis present

## 2024-03-23 DIAGNOSIS — Z7901 Long term (current) use of anticoagulants: Secondary | ICD-10-CM | POA: Diagnosis not present

## 2024-03-23 DIAGNOSIS — I9589 Other hypotension: Secondary | ICD-10-CM | POA: Diagnosis present

## 2024-03-23 DIAGNOSIS — Z87891 Personal history of nicotine dependence: Secondary | ICD-10-CM | POA: Diagnosis not present

## 2024-03-23 LAB — CBC WITH DIFFERENTIAL/PLATELET
Abs Immature Granulocytes: 0.01 10*3/uL (ref 0.00–0.07)
Basophils Absolute: 0.1 10*3/uL (ref 0.0–0.1)
Basophils Relative: 1 %
Eosinophils Absolute: 0.3 10*3/uL (ref 0.0–0.5)
Eosinophils Relative: 4 %
HCT: 38 % (ref 36.0–46.0)
Hemoglobin: 12.5 g/dL (ref 12.0–15.0)
Immature Granulocytes: 0 %
Lymphocytes Relative: 61 %
Lymphs Abs: 3.6 10*3/uL (ref 0.7–4.0)
MCH: 28.7 pg (ref 26.0–34.0)
MCHC: 32.9 g/dL (ref 30.0–36.0)
MCV: 87.4 fL (ref 80.0–100.0)
Monocytes Absolute: 0.6 10*3/uL (ref 0.1–1.0)
Monocytes Relative: 9 %
Neutro Abs: 1.5 10*3/uL — ABNORMAL LOW (ref 1.7–7.7)
Neutrophils Relative %: 25 %
Platelets: 231 10*3/uL (ref 150–400)
RBC: 4.35 MIL/uL (ref 3.87–5.11)
RDW: 13.8 % (ref 11.5–15.5)
WBC: 6.1 10*3/uL (ref 4.0–10.5)
nRBC: 0 % (ref 0.0–0.2)

## 2024-03-23 LAB — BASIC METABOLIC PANEL WITH GFR
Anion gap: 6 (ref 5–15)
BUN: 17 mg/dL (ref 6–20)
CO2: 22 mmol/L (ref 22–32)
Calcium: 8.4 mg/dL — ABNORMAL LOW (ref 8.9–10.3)
Chloride: 113 mmol/L — ABNORMAL HIGH (ref 98–111)
Creatinine, Ser: 0.62 mg/dL (ref 0.44–1.00)
GFR, Estimated: >60 mL/min (ref >60)
Glucose, Bld: 89 mg/dL (ref 70–99)
Potassium: 3.6 mmol/L (ref 3.5–5.1)
Sodium: 141 mmol/L (ref 135–145)

## 2024-03-23 LAB — CBG MONITORING, ED
Glucose-Capillary: 87 mg/dL (ref 70–99)
Glucose-Capillary: 100 mg/dL — ABNORMAL HIGH (ref 70–99)

## 2024-03-23 LAB — HEMOGLOBIN A1C
Hgb A1c MFr Bld: 5.6 % (ref 4.8–5.6)
Mean Plasma Glucose: 114.02 mg/dL

## 2024-03-23 LAB — HIV ANTIBODY (ROUTINE TESTING W REFLEX): HIV Screen 4th Generation wRfx: NONREACTIVE

## 2024-03-23 LAB — CORTISOL: Cortisol, Plasma: 4 ug/dL

## 2024-03-23 LAB — GLUCOSE, CAPILLARY: Glucose-Capillary: 111 mg/dL — ABNORMAL HIGH (ref 70–99)

## 2024-03-23 MED ORDER — LACTATED RINGERS IV SOLN: INTRAVENOUS | Status: DC

## 2024-03-23 MED ORDER — INSULIN ASPART 100 UNIT/ML IJ SOLN
0.0000 [IU] | Freq: Three times a day (TID) | INTRAMUSCULAR | Status: DC
  Administered 2024-03-24: 1 [IU] via SUBCUTANEOUS

## 2024-03-23 MED ORDER — MIDODRINE HCL 5 MG PO TABS
5.0000 mg | ORAL_TABLET | Freq: Three times a day (TID) | ORAL | Status: DC
  Administered 2024-03-24: 5 mg via ORAL
  Filled 2024-03-23: qty 1

## 2024-03-23 NOTE — Progress Notes (Signed)
 Patient iv dislodged and patient cleaned up. Will contact IV team to replace IV as patient is a difficult stick.

## 2024-03-23 NOTE — ED Notes (Signed)
 Pt gown changed and repositioned for comfort

## 2024-03-23 NOTE — ED Notes (Signed)
Pt CBG 96. 

## 2024-03-23 NOTE — ED Notes (Signed)
Pt changed, turned and repositioned

## 2024-03-23 NOTE — Telephone Encounter (Signed)
 Attempted to reach pt to confirm her appt as she is scheduled for 2 different times this afternoon. Left a voicemail to call us  back.

## 2024-03-23 NOTE — Progress Notes (Signed)
  Progress Note   Patient: Sheri Beck ZOX:[id number] DOB: 1965/11/08 DOA: 03/19/2024     0 DOS: the patient was seen and examined on 03/23/2024   Brief hospital course: 58 y.o. female with history of stroke with left-sided hemiplegia, DVT, hypertension, anxiety, prior history of substance abuse had come to the ER on March 19, 2024 with seizure-like activity.  Patient used to be on Keppra  which she had not taken for a long time and patient was discharged back on Keppra .  But after discharging home patient comes back with complaints of dizziness and weakness was found to be hypotensive.  Assessment and Plan:  Persistent hypotension -   in the setting of antihypertensive use (Lisinopril ,hydrochlorothiazide , Amlodipine , last dose on 6/18).  Does not appear septic.   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 on Eliquis .  Anxiety: on BuSpar  and Lexapro .  Seizures: No acute issues. Continue with seizures.     Subjective: No acute issues. Complaining of generalized weakness.  Physical Exam: Vitals:   03/23/24 1000 03/23/24 1100 03/23/24 1200 03/23/24 1323  BP: 97/77 (!) 87/62 (!) 81/71 103/73  Pulse:    71  Resp: 14 14 12 17   Temp:    97.8 F (36.6 C)  TempSrc:    Oral  SpO2:    100%  Height:       Constitutional: NAD, calm, comfortable Eyes: PERRL, lids and conjunctivae normal ENMT: Mucous membranes are moist. Posterior pharynx clear of any exudate or lesions.Normal dentition.  Neck: normal, supple, no masses, no thyromegaly Respiratory: clear to auscultation bilaterally, no wheezing, no crackles. Normal respiratory effort. No accessory muscle use.  Cardiovascular: Regular rate and rhythm, no murmurs / rubs / gallops. No extremity edema. 2+ pedal pulses. No carotid bruits.   Abdomen: no tenderness, no masses palpated. No hepatosplenomegaly. Bowel sounds positive.  Musculoskeletal: no clubbing / cyanosis. No joint deformity upper and lower extremities. Good ROM, no contractures. Normal muscle tone.  Skin: no rashes, lesions, ulcers. No induration Neurologic: Left sided hemiplegia Psychiatric: Normal judgment and insight. Alert and oriented x 3. Normal mood.    Data Reviewed:  There are no new results to review at this time.  Family Communication: None at the bedside  Disposition: Status is: Inpatient Remains inpatient appropriate because: Hypotension  Planned Discharge Destination: Skilled nursing facility    Time spent: 39 minutes  Author: Clancy Crimes, MD 03/23/2024 1:44 PM  For on call review [email or web address].

## 2024-03-23 NOTE — Progress Notes (Signed)
 Physical Therapy Treatment Patient Details Name: Sheri Beck MRN: [id number] DOB: [birth year] Today's Date: 03/23/2024   History of Present Illness Patient is 58 y.o. female presents 03/19/24 to ED with chief complaint of weakness and dizziness. Of note pt seen and discharged from [facility] ~12 hours prior to presentation to [facility] for seizure like activity (not taking anticonvulsants).  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    PT Comments  Pt tolerated mobility progressions well requiring less physical assistance than in previous session. Pt was able to stand edge of bed w/ heavy physical assistance of 1 therapist, but requires frequent cueing for all mobility and constant reorientation to tasks. Pt would benefit from further bed mobility and transfer training as pt reports performing stand-pivot transfer w/ assistance to wheelchair at baseline. PT will continue to treat pt while she is admitted. Patient will benefit from continued inpatient follow up therapy, <3 hours/day     If plan is discharge home, recommend the following: Two people to help with walking and/or transfers;Two people to help with bathing/dressing/bathroom;Assistance with cooking/housework;Assist for transportation;Help with stairs or ramp for entrance;Direct supervision/assist for medications management;Supervision due to cognitive status   Can travel by private vehicle     No  Equipment Recommendations  None recommended by PT    Recommendations for Other Services       Precautions / Restrictions Precautions Precautions: Fall Recall of Precautions/Restrictions: Intact Restrictions Weight Bearing Restrictions Per Provider Order: No     Mobility  Bed Mobility Overal bed mobility: Needs Assistance Bed Mobility: Supine to Sit, Sit to Supine Rolling: Mod assist   Supine to sit: Mod  assist     General bed mobility comments: Pt able to initiate task, but requires physical assistance for LLE and occasional assistance for trunk. VC given for sequencing; increased time to complete. Pt able to situate self in bed using RUE at headboard to pull herself up.    Transfers Overall transfer level: Needs assistance Equipment used: 1 person hand held assist Transfers: Sit to/from Stand Sit to Stand: Max assist           General transfer comment: Pt able to complete STS x2 trials from EOB, using RUE to push-up from bed w/ therapist providing R knee block.    Ambulation/Gait                   Stairs             Wheelchair Mobility     Tilt Bed    Modified Rankin (Stroke Patients Only)       Balance Overall balance assessment: Needs assistance Sitting-balance support: Single extremity supported, Feet supported Sitting balance-Leahy Scale: Fair Sitting balance - Comments: able to sit EOB w/ single UE support for ~1 minute   Standing balance support: Single extremity supported, During functional activity, Reliant on assistive device for balance Standing balance-Leahy Scale: Poor Standing balance comment: reliant on external support                            Communication Communication Communication: Impaired Factors Affecting Communication: Reduced clarity of speech  Cognition Arousal: Alert Behavior During Therapy: Flat affect   PT - Cognitive impairments: Awareness, Memory, Attention, Initiation, Sequencing, Problem solving, Safety/Judgement  Following commands: Impaired Following commands impaired: Follows one step commands with increased time    Cueing Cueing Techniques: Verbal cues, Visual cues  Exercises Other Exercises Other Exercises: supine PF stretch 4x30s hold    General Comments General comments (skin integrity, edema, etc.): no signs of acute distress      Pertinent  Vitals/Pain Pain Assessment Pain Assessment: No/denies pain Pain Intervention(s): Limited activity within patient's tolerance, Monitored during session    Home Living                          Prior Function            PT Goals (current goals can now be found in the care plan section) Acute Rehab PT Goals Patient Stated Goal: do pool rehab PT Goal Formulation: With patient Time For Goal Achievement: 04/03/24 Potential to Achieve Goals: Fair Progress towards PT goals: Progressing toward goals    Frequency    Min 1X/week      PT Plan      Co-evaluation              AM-PAC PT 6 Clicks Mobility   Outcome Measure  Help needed turning from your back to your side while in a flat bed without using bedrails?: Total Help needed moving from lying on your back to sitting on the side of a flat bed without using bedrails?: A Lot Help needed moving to and from a bed to a chair (including a wheelchair)?: Total Help needed standing up from a chair using your arms (e.g., wheelchair or bedside chair)?: A Lot Help needed to walk in hospital room?: Total Help needed climbing 3-5 steps with a railing? : Total 6 Click Score: 8    End of Session Equipment Utilized During Treatment: Gait belt Activity Tolerance: Patient tolerated treatment well Patient left: in bed;with call bell/phone within reach Nurse Communication: Mobility status PT Visit Diagnosis: Muscle weakness (generalized) (M62.81);Difficulty in walking, not elsewhere classified (R26.2);Other symptoms and signs involving the nervous system (R29.898);Hemiplegia and hemiparesis Hemiplegia - Right/Left: Left Hemiplegia - dominant/non-dominant: Non-dominant Hemiplegia - caused by: Cerebral infarction (chronic)     Time: 1610-9604 PT Time Calculation (min) (ACUTE ONLY): 21 min  Charges:    $Therapeutic Activity: 8-22 mins PT General Charges $$ ACUTE PT VISIT: 1 Visit                     Lonell Rives,  SPT Acute Rehab [phone]    Lonell Rives 03/23/2024, 1:16 PM

## 2024-03-23 NOTE — ED Notes (Signed)
 Patient denies pain and is resting comfortably.

## 2024-03-23 NOTE — Progress Notes (Signed)
 Telemetry placed with 2 person identifier.

## 2024-03-24 DIAGNOSIS — I952 Hypotension due to drugs: Secondary | ICD-10-CM | POA: Diagnosis not present

## 2024-03-24 LAB — LIPID PANEL
Cholesterol: 194 mg/dL (ref 0–200)
HDL: 35 mg/dL — ABNORMAL LOW (ref >40)
LDL Cholesterol: 141 mg/dL — ABNORMAL HIGH (ref 0–99)
Total CHOL/HDL Ratio: 5.5 ratio
Triglycerides: 90 mg/dL (ref <150)
VLDL: 18 mg/dL (ref 0–40)

## 2024-03-24 LAB — GLUCOSE, CAPILLARY
Glucose-Capillary: 85 mg/dL (ref 70–99)
Glucose-Capillary: 194 mg/dL — ABNORMAL HIGH (ref 70–99)
Glucose-Capillary: 100 mg/dL — ABNORMAL HIGH (ref 70–99)
Glucose-Capillary: 116 mg/dL — ABNORMAL HIGH (ref 70–99)

## 2024-03-24 MED ORDER — DIPHENHYDRAMINE-ZINC ACETATE 2-0.1 % EX CREA
TOPICAL_CREAM | Freq: Two times a day (BID) | CUTANEOUS | Status: DC | PRN
  Filled 2024-03-24: qty 28

## 2024-03-24 MED ORDER — ATORVASTATIN CALCIUM 10 MG PO TABS
20.0000 mg | ORAL_TABLET | Freq: Every day | ORAL | Status: DC
  Administered 2024-03-24 – 2024-03-26 (×3): 20 mg via ORAL
  Filled 2024-03-24 (×3): qty 2

## 2024-03-24 NOTE — Progress Notes (Signed)
  Progress Note   Patient: Sheri Beck FMW:[id number] DOB: March 20, 1966 DOA: 03/19/2024     1 DOS: the patient was seen and examined on 03/24/2024   Brief hospital course: 58 y.o. female with history of stroke with left-sided hemiplegia, DVT, hypertension, anxiety, prior history of substance abuse had come to the ER on March 19, 2024 with seizure-like activity.  Patient used to be on Keppra  which she had not taken for a long time and patient was discharged back on Keppra .  But after discharging home patient comes back with complaints of dizziness and weakness was found to be hypotensive, resolved now. Pending SNF Placement.  Assessment and Plan:  Persistent hypotension -   Resolved now. in the setting of antihypertensive use (Lisinopril ,hydrochlorothiazide , Amlodipine , last dose on 6/18).  Does not appear septic.   Received IVF, now off  F/u am cortisol-4 Started on oral midodrine  but dced on 6/21.  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 on Eliquis .  Anxiety: on BuSpar  and Lexapro .  Seizures: No acute issues. Continue with anti-seizure meds.   Hyperlipidemia: LDL 141. Started on lipitor     Subjective: No acute issues. Denies any active complaints this am.  Physical Exam: Vitals:   03/23/24 1819 03/23/24 2046 03/23/24 2332 03/24/24 0407  BP: 113/85 (!) 142/87 (!) 147/87 129/80  Pulse: 62 73 70 78  Resp: 18 20 20 19   Temp: 97.6 F (36.4 C) 97.9 F (36.6 C) 98.7 F (37.1 C) 98.5 F (36.9 C)  TempSrc:      SpO2: 100% 100% 100% 100%  Height:       Constitutional: NAD, calm, comfortable Eyes: PERRL, lids and conjunctivae normal ENMT: Mucous membranes are moist. Posterior pharynx clear of any exudate or lesions.Normal dentition.  Neck: normal, supple, no masses, no thyromegaly Respiratory: clear to auscultation bilaterally, no  wheezing, no crackles. Normal respiratory effort. No accessory muscle use.  Cardiovascular: Regular rate and rhythm, no murmurs / rubs / gallops. No extremity edema. 2+ pedal pulses. No carotid bruits.  Abdomen: no tenderness, no masses palpated. No hepatosplenomegaly. Bowel sounds positive.  Musculoskeletal: no clubbing / cyanosis. No joint deformity upper and lower extremities. Good ROM, no contractures. Normal muscle tone.  Skin: no rashes, lesions, ulcers. No induration Neurologic: Left sided hemiplegia Psychiatric: Normal judgment and insight. Alert and oriented x 3. Normal mood.    Data Reviewed:  There are no new results to review at this time.  Family Communication: None at the bedside  Disposition: Status is: Inpatient Remains inpatient appropriate because: Hypotension  Planned Discharge Destination: Skilled nursing facility    Time spent: 39 minutes  Author: Deliliah Room, MD 03/24/2024 10:27 AM  For on call review [email or web address].

## 2024-03-24 NOTE — Plan of Care (Signed)
 Patient educated on disease management, room equipment, safety, fall risk, diabetes management, nutrition, and to call for assistance at all times.

## 2024-03-24 NOTE — Plan of Care (Signed)

## 2024-03-25 DIAGNOSIS — I952 Hypotension due to drugs: Secondary | ICD-10-CM | POA: Diagnosis not present

## 2024-03-25 LAB — GLUCOSE, CAPILLARY
Glucose-Capillary: 128 mg/dL — ABNORMAL HIGH (ref 70–99)
Glucose-Capillary: 135 mg/dL — ABNORMAL HIGH (ref 70–99)
Glucose-Capillary: 104 mg/dL — ABNORMAL HIGH (ref 70–99)
Glucose-Capillary: 102 mg/dL — ABNORMAL HIGH (ref 70–99)

## 2024-03-25 NOTE — Progress Notes (Signed)
  Progress Note   Patient: Sheri Beck FMW:[id number] DOB: 07/19/1966 DOA: 03/19/2024     2 DOS: the patient was seen and examined on 03/25/2024   Brief hospital course: 58 y.o. female with history of stroke with left-sided hemiplegia, DVT, hypertension, anxiety, prior history of substance abuse had come to the ER on March 19, 2024 with seizure-like activity.  Patient used to be on Keppra  which she had not taken for a long time and patient was discharged back on Keppra .  But after discharging home patient comes back with complaints of dizziness and weakness was found to be hypotensive, resolved now. Pending SNF Placement.  Assessment and Plan:  Persistent hypotension -   Resolved now. in the setting of antihypertensive use (Lisinopril ,hydrochlorothiazide , Amlodipine , last dose on 6/18).  Does not appear septic.   Received IVF, now off  F/u am cortisol-4 Started on oral midodrine  but dced on 6/21. Monitor BP closely.  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 on Eliquis .  Anxiety: on BuSpar  and Lexapro .  Seizures: No acute issues. Continue with anti-seizure meds.   Hyperlipidemia: LDL 141. Started on lipitor     Subjective: No acute issues overnight. Denies any active complaints this morning. BP stable.  Physical Exam: Vitals:   03/24/24 0407 03/24/24 2158 03/25/24 0522 03/25/24 0816  BP: 129/80 (!) 145/87 134/85 120/81  Pulse: 78 67 66 72  Resp: 19 20 20 18   Temp: 98.5 F (36.9 C) 98.4 F (36.9 C) 97.7 F (36.5 C) 98.3 F (36.8 C)  TempSrc:   Oral Oral  SpO2: 100% 100% 100% 98%  Height:       Constitutional: NAD, calm, comfortable Eyes: PERRL, lids and conjunctivae normal ENMT: Mucous membranes are moist. Posterior pharynx clear of any exudate or lesions.Normal dentition.  Neck: normal, supple, no masses, no  thyromegaly Respiratory: clear to auscultation bilaterally, no wheezing, no crackles. Normal respiratory effort. No accessory muscle use.  Cardiovascular: Regular rate and rhythm, no murmurs / rubs / gallops. No extremity edema. 2+ pedal pulses. No carotid bruits.  Abdomen: no tenderness, no masses palpated. No hepatosplenomegaly. Bowel sounds positive.  Musculoskeletal: no clubbing / cyanosis. No joint deformity upper and lower extremities. Good ROM, no contractures. Normal muscle tone.  Skin: no rashes, lesions, ulcers. No induration Neurologic: Left sided hemiplegia Psychiatric: Normal judgment and insight. Alert and oriented x 3. Normal mood.    Data Reviewed:  There are no new results to review at this time.  Family Communication: None at the bedside  Disposition: Status is: Inpatient Remains inpatient appropriate because: Hypotension  Planned Discharge Destination: Skilled nursing facility    Time spent: 39 minutes  Author: Deliliah Room, MD 03/25/2024 10:12 AM  For on call review [email or web address].

## 2024-03-25 NOTE — Plan of Care (Signed)

## 2024-03-26 DIAGNOSIS — I952 Hypotension due to drugs: Secondary | ICD-10-CM | POA: Diagnosis not present

## 2024-03-26 LAB — GLUCOSE, CAPILLARY
Glucose-Capillary: 96 mg/dL (ref 70–99)
Glucose-Capillary: 114 mg/dL — ABNORMAL HIGH (ref 70–99)
Glucose-Capillary: 92 mg/dL (ref 70–99)
Glucose-Capillary: 86 mg/dL (ref 70–99)

## 2024-03-26 NOTE — Progress Notes (Signed)
  Progress Note   Patient: Sheri Beck FMW:[id number] DOB: [birth year] DOA: 03/19/2024     3 DOS: the patient was seen and examined on 03/26/2024   Brief hospital course: 58 y.o. female with history of stroke with left-sided hemiplegia, DVT, hypertension, anxiety, prior history of substance abuse had come to the ER on March 19, 2024 with seizure-like activity.  Patient used to be on Keppra  which she had not taken for a long time and patient was discharged back on Keppra .  But after discharging home patient comes back with complaints of dizziness and weakness was found to be hypotensive, resolved now. Pending SNF Placement.  Assessment and Plan:  Persistent hypotension -   Resolved now. in the setting of antihypertensive use (Lisinopril ,hydrochlorothiazide , Amlodipine , last dose on 6/18).  Does not appear septic.   Received IVF, now off  F/u am cortisol-4 Started on oral midodrine  but dced on 6/21. Monitor BP closely.  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 on Eliquis .  Anxiety: on BuSpar  and Lexapro .  Seizures: No acute issues. Continue with anti-seizure meds.   Hyperlipidemia: LDL 141. Started on lipitor  Physical deconditioning: Continue with PT OT.     Subjective: No acute issues overnight. She said that she can't take care of herself at home. She has a daughter who just had a baby.   Physical Exam: Vitals:   03/25/24 1251 03/25/24 1629 03/25/24 1950 03/26/24 0539  BP: 115/73 124/79 (!) 151/91 139/89  Pulse: 66 65 (!) 59 67  Resp: 20 18 20 20   Temp: 97.7 F (36.5 C) 97.8 F (36.6 C) (!) 97.4 F (36.3 C) (!) 97.4 F (36.3 C)  TempSrc:      SpO2: 97% 98% 100% 100%  Height:       Constitutional: NAD, calm, comfortable Eyes: PERRL, lids and conjunctivae normal ENMT: Mucous membranes are moist. Posterior pharynx clear of any  exudate or lesions.Normal dentition.  Neck: normal, supple, no masses, no thyromegaly Respiratory: clear to auscultation bilaterally, no wheezing, no crackles. Normal respiratory effort. No accessory muscle use.  Cardiovascular: Regular rate and rhythm, no murmurs / rubs / gallops. No extremity edema. 2+ pedal pulses. No carotid bruits.  Abdomen: no tenderness, no masses palpated. No hepatosplenomegaly. Bowel sounds positive.  Musculoskeletal: no clubbing / cyanosis. No joint deformity upper and lower extremities. Good ROM, no contractures. Normal muscle tone.  Skin: no rashes, lesions, ulcers. No induration Neurologic: Left sided hemiplegia Psychiatric: Normal judgment and insight. Alert and oriented x 3. Normal mood.    Data Reviewed:  There are no new results to review at this time.  Family Communication: None at the bedside  Disposition: Status is: Inpatient Remains inpatient appropriate because: Hypotension  Planned Discharge Destination: Skilled nursing facility    Time spent: 39 minutes  Author: Deliliah Room, MD 03/26/2024 9:34 AM  For on call review [email or web address].

## 2024-03-26 NOTE — Plan of Care (Signed)
  Problem: Education: Goal: Ability to describe self-care measures that may prevent or decrease complications (Diabetes Survival Skills Education) will improve Outcome: Progressing   Problem: Coping: Goal: Ability to adjust to condition or change in health will improve Outcome: Progressing   Problem: Nutritional: Goal: Maintenance of adequate nutrition will improve Outcome: Progressing

## 2024-03-26 NOTE — TOC Progression Note (Addendum)
 Transition of Care [facility]) - Progression Note    Patient Details  Name: Sheri Beck MRN: [id number] Date of Birth: [birth year]  [facility] CM/SW Contact  Inmer Nix A [place], LCSW Phone Number: 03/26/2024, 1:30 PM  Clinical Narrative:     Update 1624  [facility] updated pt, authorization approved to facility for SNF. Bed available tomorrow. Provider notified. EDD tomorrow.     CSW followed up with Isaiah at [facility], pt's authorization is pending for placement.    TOC will continue to follow.        Expected Discharge Plan and Services                                               Social Determinants of Health (SDOH) Interventions SDOH Screenings   Food Insecurity: No Food Insecurity (03/23/2024)  Housing: Low Risk  (03/23/2024)  Transportation Needs: Unmet Transportation Needs (03/23/2024)  Utilities: Not At Risk (03/23/2024)  Depression (PHQ2-9): High Risk (11/01/2023)  Tobacco Use: Medium Risk (03/23/2024)    Readmission Risk Interventions    02/21/2023    4:54 PM  Readmission Risk Prevention Plan  Transportation Screening Complete  Medication Review (RN Care Manager) Complete  PCP or Specialist appointment within 3-5 days of discharge Complete  HRI or Home Care Consult Complete  SW Recovery Care/Counseling Consult Complete  Palliative Care Screening Not Applicable  Skilled Nursing Facility Not Applicable

## 2024-03-26 NOTE — Plan of Care (Signed)
  Problem: Education: Goal: Ability to describe self-care measures that may prevent or decrease complications (Diabetes Survival Skills Education) will improve 03/26/2024 1834 by Virgene Stamps, RN Outcome: Progressing 03/26/2024 1833 by Virgene Stamps, RN Outcome: Progressing   Problem: Coping: Goal: Ability to adjust to condition or change in health will improve 03/26/2024 1834 by Virgene Stamps, RN Outcome: Progressing 03/26/2024 1833 by Virgene Stamps, RN Outcome: Progressing   Problem: Health Behavior/Discharge Planning: Goal: Ability to identify and utilize available resources and services will improve Outcome: Progressing   Problem: Nutritional: Goal: Maintenance of adequate nutrition will improve Outcome: Progressing

## 2024-03-26 NOTE — Progress Notes (Signed)
   03/26/24 1130  Spiritual Encounters  Type of Visit Initial  Care provided to: Patient  Reason for visit Advance directives  OnCall Visit No  Interventions  Spiritual Care Interventions Made Established relationship of care and support;Compassionate presence;Decision-making support/facilitation  Intervention Outcomes  Outcomes Connection to spiritual care  Advance Directives (For Healthcare)  Does Patient Have a Medical Advance Directive? No  Would patient like information on creating a medical advance directive? Yes (Inpatient - patient defers creating a medical advance directive at this time - Information given)    Chaplain responded to spiritual care consult for AD. Information and paperwork provided.

## 2024-03-27 ENCOUNTER — Telehealth: Payer: Self-pay | Admitting: *Deleted

## 2024-03-27 DIAGNOSIS — I952 Hypotension due to drugs: Secondary | ICD-10-CM | POA: Diagnosis not present

## 2024-03-27 LAB — GLUCOSE, CAPILLARY: Glucose-Capillary: 97 mg/dL (ref 70–99)

## 2024-03-27 MED ORDER — ZOLPIDEM TARTRATE 5 MG PO TABS: 5.0000 mg | ORAL_TABLET | Freq: Every evening | ORAL | 0 refills | Status: AC | PRN

## 2024-03-27 MED ORDER — ACETAMINOPHEN 325 MG PO TABS: 650.0000 mg | ORAL_TABLET | Freq: Four times a day (QID) | ORAL | Status: AC | PRN

## 2024-03-27 MED ORDER — ATORVASTATIN CALCIUM 20 MG PO TABS
20.0000 mg | ORAL_TABLET | Freq: Every day | ORAL | 0 refills | Status: AC
Start: 2024-03-27 — End: ?

## 2024-03-27 NOTE — Discharge Summary (Signed)
 Physician Discharge Summary   Patient: Sheri Beck MRN: [id number] DOB: Oct 30, 1965  Admit date:     03/19/2024  Discharge date: 03/27/24  Discharge Physician: Deliliah Room   PCP: Ozell Heron HERO, MD   Recommendations at discharge:    Follow up with PCP in one week Check BP and HR daily May need to be restarted on BP meds if BP is on the higher side on a consistent basis.  Discharge Diagnoses: Principal Problem:   Hypotension Active Problems:   Essential hypertension   Hyperlipidemia   Stroke [facility])   History of CVA with residual deficit   Seizure (HCC)   History of DVT in adulthood   GAD (generalized anxiety disorder)    Hospital Course:   58 y.o. female with history of stroke with left-sided hemiplegia, DVT, hypertension, anxiety, prior history of substance abuse had come to the ER on March 19, 2024 with seizure-like activity.  Patient used to be on Keppra  which she had not taken for a long time and patient was discharged back on Keppra .  But after discharging home, patient came back with complaints of dizziness and weakness and was found to be hypotensive, resolved now.   Persistent hypotension -   Resolved now. in the setting of antihypertensive use (Lisinopril ,hydrochlorothiazide , Amlodipine , last dose on 6/18).  Does not appear septic.   Received IVF, now off  Started on oral midodrine  but dced on 6/21. Monitor BP closely and restart antihypertensives, once appropriate.   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 on Eliquis .   Anxiety: on BuSpar  and Lexapro .   Seizures: No acute issues. Continue with anti-seizure meds.    Hyperlipidemia: LDL 141. Started on lipitor   Physical deconditioning: Continue with PT OT.       Consultants: None Procedures performed: None  Disposition: Skilled nursing facility Diet  recommendation:  Cardiac diet DISCHARGE MEDICATION: Allergies as of 03/27/2024   No Known Allergies      Medication List     STOP taking these medications    amLODipine  10 MG tablet Commonly known as: NORVASC    lisinopril -hydrochlorothiazide  20-12.5 MG tablet Commonly known as: ZESTORETIC    potassium chloride  SA 20 MEQ tablet Commonly known as: KLOR-CON  M       TAKE these medications    acetaminophen  325 MG tablet Commonly known as: TYLENOL  Take 2 tablets (650 mg total) by mouth every 6 (six) hours as needed for mild pain (pain score 1-3) or fever (temp > 38.3 Celsius).   apixaban  5 MG Tabs tablet Commonly known as: Eliquis  Take 1 tablet (5 mg total) by mouth 2 (two) times daily.   atorvastatin  20 MG tablet Commonly known as: LIPITOR Take 1 tablet (20 mg total) by mouth at bedtime.   busPIRone  7.5 MG tablet Commonly known as: BUSPAR  Take 7.5 mg by mouth daily at 12 noon.   escitalopram  10 MG tablet Commonly known as: Lexapro  Take 1 tablet (10 mg total) by mouth daily.   levETIRAcetam  500 MG tablet Commonly known as: Keppra  Take 1 tablet (500 mg total) by mouth 2 (two) times daily.   Vitamin D3 1.25 MG (50000 UT) Caps Take 1 capsule (50,000 Units total) by mouth every 7 (seven) days.   zolpidem  5 MG tablet Commonly known as: AMBIEN  Take 1 tablet (5 mg total) by mouth at bedtime as needed for sleep.  Contact information for follow-up providers     Ozell Heron HERO, MD Follow up in 1 week(s).   Specialty: Family Medicine Contact information: [address] [place] [address] [phone]              Contact information for after-discharge care     Destination     [facility] .   Service: Skilled Nursing Contact information: [address] [place]  [address] [phone]                    Discharge Exam: There were no vitals filed for this visit. Constitutional: NAD, calm,  comfortable Eyes: PERRL, lids and conjunctivae normal ENMT: Mucous membranes are moist. Posterior pharynx clear of any exudate or lesions.Normal dentition.  Neck: normal, supple, no masses, no thyromegaly Respiratory: clear to auscultation bilaterally, no wheezing, no crackles. Normal respiratory effort. No accessory muscle use.  Cardiovascular: Regular rate and rhythm, no murmurs / rubs / gallops. No extremity edema. 2+ pedal pulses. No carotid bruits.  Abdomen: no tenderness, no masses palpated. No hepatosplenomegaly. Bowel sounds positive.  Musculoskeletal: no clubbing / cyanosis. No joint deformity upper and lower extremities. Good ROM, no contractures. Normal muscle tone.  Skin: no rashes, lesions, ulcers. No induration Neurologic: CN 2-12 grossly intact. Sensation intact, DTR normal. Strength 5/5 x all 4 extremities.  Psychiatric: Normal judgment and insight. Alert and oriented x 3. Normal mood.    Condition at discharge: good  The results of significant diagnostics from this hospitalization (including imaging, microbiology, ancillary and laboratory) are listed below for reference.   Imaging Studies: DG Chest Port 1 View Result Date: 03/22/2024 CLINICAL DATA:  Weakness. EXAM: PORTABLE CHEST 1 VIEW COMPARISON:  02/21/2023 FINDINGS: Stable cardiomediastinal contours. Low lung volumes. No pleural fluid, interstitial edema or airspace disease. Generalized osseous structures are unremarkable. IMPRESSION: Low lung volumes. No acute findings. Electronically Signed   By: Waddell Calk M.D.   On: 03/22/2024 08:56   MR BRAIN WO CONTRAST Result Date: 03/20/2024 CLINICAL DATA:  Syncope/presyncope, cerebrovascular cause suspected EXAM: MRI HEAD WITHOUT CONTRAST TECHNIQUE: Multiplanar, multiecho pulse sequences of the brain and surrounding structures were obtained without intravenous contrast. COMPARISON:  CT head February 14 25.MRI head March 13, 2023. FINDINGS: Most limb study.  Within this limitation:  Brain: Chronic large remote right MCA territory infarct with overlying hemi craniectomy and herniation of brain through the defect. Wallerian degeneration in the right brainstem. No evidence of acute hemorrhage, acute infarct, mass lesion, or midline shift. Small remote right cerebellar infarct. Patchy T2/FLAIR hyperintensities the white matter, compatible with chronic microvascular ischemic disease. Vascular: Major arterial flow voids are maintained at the skull base. Skull and upper cervical spine: Normal marrow signal. Sinuses/Orbits: Clear sinuses.  No acute orbital findings. Other: No mastoid effusions. IMPRESSION: 1. No evidence of acute intracranial abnormality. 2. Large right MCA territory infarct. Electronically Signed   By: Gilmore GORMAN Molt M.D.   On: 03/20/2024 00:14    Microbiology: Results for orders placed or performed during the hospital encounter of 11/18/23  Urine Culture     Status: Abnormal   Collection Time: 11/18/23  8:57 PM   Specimen: Urine, Clean Catch  Result Value Ref Range Status   Specimen Description URINE, CLEAN CATCH  Final   Special Requests NONE  Final   Culture (A)  Final    80,000 COLONIES/mL LACTOBACILLUS SPECIES Standardized susceptibility testing for this organism is not available. Performed at [facility], [address],  [place], [place] [address]    Report Status 11/20/2023 FINAL  Final    Labs: CBC: Recent Labs  Lab 03/22/24 0821 03/22/24 1944 03/23/24 0208  WBC 5.8 5.7 6.1  NEUTROABS 1.5* 1.8 1.5*  HGB 13.4 13.8 12.5  HCT 40.7 42.1 38.0  MCV 86.8 87.7 87.4  PLT 242 255 231   Basic Metabolic Panel: Recent Labs  Lab 03/22/24 0821 03/22/24 2100 03/23/24 0208  NA 142 140 141  K 3.4* 4.0 3.6  CL 107 108 113*  CO2 25 26 22   GLUCOSE 103* 95 89  BUN 17 17 17   CREATININE 0.67 0.71 0.62  CALCIUM  9.0 8.8* 8.4*   Liver Function Tests: No results for input(s): AST, ALT, ALKPHOS, BILITOT, PROT, ALBUMIN in the last 168  hours. CBG: Recent Labs  Lab 03/25/24 1953 03/26/24 0945 03/26/24 1212 03/26/24 1836 03/27/24 0752  GLUCAP 102* 114* 92 86 97    Discharge time spent: greater than 30 minutes.  Signed: Deliliah Room, MD [facility] 03/27/2024

## 2024-03-27 NOTE — Plan of Care (Signed)
  Problem: Education: Goal: Ability to describe self-care measures that may prevent or decrease complications (Diabetes Survival Skills Education) will improve Outcome: Adequate for Discharge   Problem: Coping: Goal: Ability to adjust to condition or change in health will improve Outcome: Adequate for Discharge   Problem: Fluid Volume: Goal: Ability to maintain a balanced intake and output will improve Outcome: Adequate for Discharge   Problem: Health Behavior/Discharge Planning: Goal: Ability to identify and utilize available resources and services will improve Outcome: Adequate for Discharge Goal: Ability to manage health-related needs will improve Outcome: Adequate for Discharge   Problem: Metabolic: Goal: Ability to maintain appropriate glucose levels will improve Outcome: Adequate for Discharge   Problem: Nutritional: Goal: Maintenance of adequate nutrition will improve Outcome: Adequate for Discharge Goal: Progress toward achieving an optimal weight will improve Outcome: Adequate for Discharge   Problem: Skin Integrity: Goal: Risk for impaired skin integrity will decrease Outcome: Adequate for Discharge   Problem: Tissue Perfusion: Goal: Adequacy of tissue perfusion will improve Outcome: Adequate for Discharge   Problem: Education: Goal: Knowledge of General Education information will improve Description: Including pain rating scale, medication(s)/side effects and non-pharmacologic comfort measures Outcome: Adequate for Discharge   Problem: Health Behavior/Discharge Planning: Goal: Ability to manage health-related needs will improve Outcome: Adequate for Discharge   Problem: Clinical Measurements: Goal: Ability to maintain clinical measurements within normal limits will improve Outcome: Adequate for Discharge Goal: Will remain free from infection Outcome: Adequate for Discharge Goal: Diagnostic test results will improve Outcome: Adequate for Discharge Goal:  Respiratory complications will improve Outcome: Adequate for Discharge Goal: Cardiovascular complication will be avoided Outcome: Adequate for Discharge   Problem: Activity: Goal: Risk for activity intolerance will decrease Outcome: Adequate for Discharge   Problem: Nutrition: Goal: Adequate nutrition will be maintained Outcome: Adequate for Discharge   Problem: Coping: Goal: Level of anxiety will decrease Outcome: Adequate for Discharge   Problem: Elimination: Goal: Will not experience complications related to bowel motility Outcome: Adequate for Discharge Goal: Will not experience complications related to urinary retention Outcome: Adequate for Discharge   Problem: Pain Managment: Goal: General experience of comfort will improve and/or be controlled Outcome: Adequate for Discharge   Problem: Safety: Goal: Ability to remain free from injury will improve Outcome: Adequate for Discharge   Problem: Skin Integrity: Goal: Risk for impaired skin integrity will decrease Outcome: Adequate for Discharge

## 2024-03-27 NOTE — Telephone Encounter (Signed)
 Copied from CRM [id number]. Topic: General - Other >> Mar 27, 2024  8:45 AM Henretta I wrote: Reason for CRM: Patient's daughter called to see if it was possible to get mothers homehealth paperwork sent to her via email and faxed to other number  Email : [email or web address]  Fax [phone]

## 2024-03-27 NOTE — Plan of Care (Signed)

## 2024-03-27 NOTE — Progress Notes (Addendum)
 Pt to discharge to [facility] rehab rm [phone], report called and given to Anisha.

## 2024-03-27 NOTE — TOC Transition Note (Addendum)
 Transition of Care [facility]) - Discharge Note   Patient Details  Name: Sheri Beck MRN: [id number] Date of Birth: [birth year]  Transition of Care [facility]) CM/SW Contact:  Xzavior Reinig A [place], LCSW Phone Number: 03/27/2024, 10:55 AM   Clinical Narrative:     Patient will DC to: [facility]  Anticipated DC date: 03/27/24  Family notified: Buford Grass  Transport by: [facility]      Per MD patient ready for DC to [facility] . RN, patient, patient's family, and facility notified of DC. Discharge Summary and FL2 sent to facility. RN to call report prior to discharge (Room 510-B, [phone]). DC packet on chart. Ambulance transport requested for patient at 11:05.  CSW was notified that pt is being followed by [facility] DSS, Christopher Squibb, [phone]. Pt requested CSW reach out and notify pt's dispo plan for SNF. CSW contacted Christopher and notified of DC to [facility], contact and address provided, stated will follow up with pt at the facility.      CSW will sign off for now as social work intervention is no longer needed. Please consult us  again if new needs arise.    Final next level of care: Skilled Nursing Facility Barriers to Discharge: Barriers Resolved   Patient Goals and CMS Choice            Discharge Placement              Patient chooses bed at: [facility] Patient to be transferred to facility by: [facility] Name of family member notified: Buford Grass Patient and family notified of of transfer: 03/27/24  Discharge Plan and Services Additional resources added to the After Visit Summary for                                       Social Drivers of Health (SDOH) Interventions SDOH Screenings   Food Insecurity: No Food Insecurity (03/23/2024)  Housing: Low Risk  (03/23/2024)  Transportation Needs: Unmet Transportation Needs (03/23/2024)  Utilities: Not At Risk (03/23/2024)  Depression (PHQ2-9): High Risk (11/01/2023)  Tobacco Use: Medium  Risk (03/23/2024)     Readmission Risk Interventions    02/21/2023    4:54 PM  Readmission Risk Prevention Plan  Transportation Screening Complete  Medication Review (RN Care Manager) Complete  PCP or Specialist appointment within 3-5 days of discharge Complete  HRI or Home Care Consult Complete  SW Recovery Care/Counseling Consult Complete  Palliative Care Screening Not Applicable  Skilled Nursing Facility Not Applicable

## 2024-03-27 NOTE — Telephone Encounter (Signed)
 Spoke with the patient's daughter and informed her we are unable to send information via email.  She was informed a [id number] form was faxed to [facility] with [employer] on 6/5 and has not been scanned into the chart yet (see prior phone note).   I apologized for the delay and gave her the phone number for [facility]- [phone].

## 2024-04-09 ENCOUNTER — Telehealth: Payer: Self-pay | Admitting: *Deleted

## 2024-04-09 NOTE — Telephone Encounter (Signed)
 Letter completed per PCP as fax requested a letter of medical necessity with need for incontinence supplies.  Placed in the red folder for signature.

## 2024-04-30 ENCOUNTER — Ambulatory Visit: Payer: BLUE CROSS/BLUE SHIELD | Admitting: Family Medicine
